# Patient Record
Sex: Female | Born: 1970
Health system: Southern US, Community
[De-identification: ages and names within clinical notes are randomized; demographics above are authoritative.]

## PROBLEM LIST (undated history)

## (undated) DIAGNOSIS — R634 Abnormal weight loss: Secondary | ICD-10-CM

## (undated) DIAGNOSIS — J449 Chronic obstructive pulmonary disease, unspecified: Secondary | ICD-10-CM

## (undated) DIAGNOSIS — R05 Cough: Secondary | ICD-10-CM

## (undated) DIAGNOSIS — K219 Gastro-esophageal reflux disease without esophagitis: Secondary | ICD-10-CM

## (undated) DIAGNOSIS — R531 Weakness: Secondary | ICD-10-CM

## (undated) DIAGNOSIS — D649 Anemia, unspecified: Secondary | ICD-10-CM

## (undated) DIAGNOSIS — R51 Headache: Secondary | ICD-10-CM

## (undated) DIAGNOSIS — J029 Acute pharyngitis, unspecified: Secondary | ICD-10-CM

## (undated) DIAGNOSIS — G43909 Migraine, unspecified, not intractable, without status migrainosus: Secondary | ICD-10-CM

## (undated) DIAGNOSIS — R059 Cough, unspecified: Secondary | ICD-10-CM

## (undated) HISTORY — DX: Headache: R51

## (undated) HISTORY — DX: Acute pharyngitis, unspecified: J02.9

## (undated) HISTORY — DX: Cough: R05

## (undated) HISTORY — DX: Gastro-esophageal reflux disease without esophagitis: K21.9

## (undated) HISTORY — PX: TUBAL LIGATION: SHX77

## (undated) HISTORY — DX: Abnormal weight loss: R63.4

## (undated) HISTORY — DX: Cough, unspecified: R05.9

## (undated) HISTORY — DX: Weakness: R53.1

## (undated) HISTORY — DX: Anemia, unspecified: D64.9

---

## 1997-06-27 ENCOUNTER — Inpatient Hospital Stay (HOSPITAL_COMMUNITY): Admission: AD | Admit: 1997-06-27 | Discharge: 1997-06-27 | Payer: Self-pay | Admitting: Obstetrics & Gynecology

## 1998-07-16 ENCOUNTER — Emergency Department (HOSPITAL_COMMUNITY): Admission: EM | Admit: 1998-07-16 | Discharge: 1998-07-16 | Payer: Self-pay | Admitting: Emergency Medicine

## 1999-06-25 ENCOUNTER — Emergency Department (HOSPITAL_COMMUNITY): Admission: EM | Admit: 1999-06-25 | Discharge: 1999-06-25 | Payer: Self-pay | Admitting: *Deleted

## 2000-04-03 ENCOUNTER — Other Ambulatory Visit: Admission: RE | Admit: 2000-04-03 | Discharge: 2000-04-03 | Payer: Self-pay

## 2000-10-02 ENCOUNTER — Emergency Department (HOSPITAL_COMMUNITY): Admission: EM | Admit: 2000-10-02 | Discharge: 2000-10-02 | Payer: Self-pay | Admitting: Emergency Medicine

## 2001-03-23 ENCOUNTER — Other Ambulatory Visit: Admission: RE | Admit: 2001-03-23 | Discharge: 2001-03-23 | Payer: Self-pay | Admitting: Family Medicine

## 2001-06-18 ENCOUNTER — Encounter: Payer: Self-pay | Admitting: Emergency Medicine

## 2001-06-18 ENCOUNTER — Emergency Department (HOSPITAL_COMMUNITY): Admission: EM | Admit: 2001-06-18 | Discharge: 2001-06-18 | Payer: Self-pay | Admitting: Emergency Medicine

## 2002-09-05 ENCOUNTER — Emergency Department (HOSPITAL_COMMUNITY): Admission: EM | Admit: 2002-09-05 | Discharge: 2002-09-05 | Payer: Self-pay | Admitting: Emergency Medicine

## 2002-09-05 ENCOUNTER — Encounter: Payer: Self-pay | Admitting: Emergency Medicine

## 2003-01-16 ENCOUNTER — Emergency Department (HOSPITAL_COMMUNITY): Admission: EM | Admit: 2003-01-16 | Discharge: 2003-01-16 | Payer: Self-pay | Admitting: Emergency Medicine

## 2003-07-21 ENCOUNTER — Emergency Department (HOSPITAL_COMMUNITY): Admission: EM | Admit: 2003-07-21 | Discharge: 2003-07-21 | Payer: Self-pay | Admitting: Emergency Medicine

## 2005-02-18 ENCOUNTER — Emergency Department (HOSPITAL_COMMUNITY): Admission: EM | Admit: 2005-02-18 | Discharge: 2005-02-18 | Payer: Self-pay | Admitting: Family Medicine

## 2005-09-15 ENCOUNTER — Emergency Department (HOSPITAL_COMMUNITY): Admission: EM | Admit: 2005-09-15 | Discharge: 2005-09-15 | Payer: Self-pay | Admitting: Emergency Medicine

## 2006-05-27 ENCOUNTER — Emergency Department (HOSPITAL_COMMUNITY): Admission: EM | Admit: 2006-05-27 | Discharge: 2006-05-27 | Payer: Self-pay | Admitting: Family Medicine

## 2006-11-27 ENCOUNTER — Encounter: Admission: RE | Admit: 2006-11-27 | Discharge: 2006-11-27 | Payer: Self-pay | Admitting: Internal Medicine

## 2008-03-16 ENCOUNTER — Emergency Department (HOSPITAL_COMMUNITY): Admission: EM | Admit: 2008-03-16 | Discharge: 2008-03-16 | Payer: Self-pay | Admitting: Emergency Medicine

## 2008-12-21 ENCOUNTER — Emergency Department (HOSPITAL_COMMUNITY): Admission: EM | Admit: 2008-12-21 | Discharge: 2008-12-21 | Payer: Self-pay | Admitting: Emergency Medicine

## 2009-09-13 ENCOUNTER — Emergency Department (HOSPITAL_COMMUNITY): Admission: EM | Admit: 2009-09-13 | Discharge: 2009-09-13 | Payer: Self-pay | Admitting: Emergency Medicine

## 2010-05-05 ENCOUNTER — Encounter: Payer: Self-pay | Admitting: Internal Medicine

## 2011-03-13 ENCOUNTER — Encounter: Payer: Self-pay | Admitting: Emergency Medicine

## 2011-03-13 ENCOUNTER — Inpatient Hospital Stay (HOSPITAL_COMMUNITY)
Admission: EM | Admit: 2011-03-13 | Discharge: 2011-03-15 | DRG: 343 | Disposition: A | Discharge status: Home / Self Care | Payer: Self-pay | Source: Ambulatory Visit | Attending: Surgery | Admitting: Surgery

## 2011-03-13 DIAGNOSIS — K37 Unspecified appendicitis: Secondary | ICD-10-CM | POA: Diagnosis present

## 2011-03-13 DIAGNOSIS — Z888 Allergy status to other drugs, medicaments and biological substances status: Secondary | ICD-10-CM

## 2011-03-13 DIAGNOSIS — K3580 Unspecified acute appendicitis: Principal | ICD-10-CM | POA: Diagnosis present

## 2011-03-13 LAB — BASIC METABOLIC PANEL
BUN: 6 mg/dL (ref 6–23)
CO2: 24 mEq/L (ref 19–32)
Calcium: 9.1 mg/dL (ref 8.4–10.5)
Chloride: 102 mEq/L (ref 96–112)
Creatinine, Ser: 0.53 mg/dL (ref 0.50–1.10)
GFR calc Af Amer: >90 mL/min (ref >90)
GFR calc non Af Amer: >90 mL/min (ref >90)
Glucose, Bld: 111 mg/dL — ABNORMAL HIGH (ref 70–99)
Potassium: 3.6 mEq/L (ref 3.5–5.1)
Sodium: 137 mEq/L (ref 135–145)

## 2011-03-13 LAB — CBC
HCT: 34.3 % — ABNORMAL LOW (ref 36.0–46.0)
Hemoglobin: 11.4 g/dL — ABNORMAL LOW (ref 12.0–15.0)
MCH: 28.1 pg (ref 26.0–34.0)
MCHC: 33.2 g/dL (ref 30.0–36.0)
MCV: 84.7 fL (ref 78.0–100.0)
Platelets: 249 10*3/uL (ref 150–400)
RBC: 4.05 MIL/uL (ref 3.87–5.11)
RDW: 15.7 % — ABNORMAL HIGH (ref 11.5–15.5)
WBC: 8.4 10*3/uL (ref 4.0–10.5)

## 2011-03-13 LAB — DIFFERENTIAL
Basophils Absolute: 0 10*3/uL (ref 0.0–0.1)
Basophils Relative: 0 % (ref 0–1)
Eosinophils Absolute: 0 10*3/uL (ref 0.0–0.7)
Eosinophils Relative: 0 % (ref 0–5)
Lymphocytes Relative: 8 % — ABNORMAL LOW (ref 12–46)
Lymphs Abs: 0.7 10*3/uL (ref 0.7–4.0)
Monocytes Absolute: 0.2 10*3/uL (ref 0.1–1.0)
Monocytes Relative: 3 % (ref 3–12)
Neutro Abs: 7.5 10*3/uL (ref 1.7–7.7)
Neutrophils Relative %: 89 % — ABNORMAL HIGH (ref 43–77)

## 2011-03-13 NOTE — ED Notes (Signed)
PT. REPORTS VOMITTING WITH MID ABDOMINAL CRAMPING ONSET THIS AFTERNOON ,  DENIES DIARRHEA , NO FEVER , SLIGHT CHILLS .

## 2011-03-14 ENCOUNTER — Encounter (HOSPITAL_COMMUNITY): Payer: Self-pay | Admitting: Anesthesiology

## 2011-03-14 ENCOUNTER — Encounter (HOSPITAL_COMMUNITY): Payer: Self-pay | Admitting: General Surgery

## 2011-03-14 ENCOUNTER — Inpatient Hospital Stay (HOSPITAL_COMMUNITY): Payer: Self-pay | Admitting: Anesthesiology

## 2011-03-14 ENCOUNTER — Encounter (HOSPITAL_COMMUNITY): Admission: EM | Disposition: A | Discharge status: Home / Self Care | Payer: Self-pay | Source: Ambulatory Visit

## 2011-03-14 ENCOUNTER — Emergency Department (HOSPITAL_COMMUNITY): Payer: Self-pay

## 2011-03-14 ENCOUNTER — Other Ambulatory Visit (INDEPENDENT_AMBULATORY_CARE_PROVIDER_SITE_OTHER): Payer: Self-pay | Admitting: Surgery

## 2011-03-14 DIAGNOSIS — K37 Unspecified appendicitis: Secondary | ICD-10-CM | POA: Diagnosis present

## 2011-03-14 DIAGNOSIS — K3580 Unspecified acute appendicitis: Secondary | ICD-10-CM

## 2011-03-14 HISTORY — PX: APPENDECTOMY: SHX54

## 2011-03-14 HISTORY — PX: LAPAROSCOPIC APPENDECTOMY: SHX408

## 2011-03-14 LAB — URINE MICROSCOPIC-ADD ON

## 2011-03-14 LAB — POCT PREGNANCY, URINE: Preg Test, Ur: NEGATIVE

## 2011-03-14 LAB — HEPATIC FUNCTION PANEL
ALT: 11 U/L (ref 0–35)
AST: 17 U/L (ref 0–37)
Albumin: 3.9 g/dL (ref 3.5–5.2)
Alkaline Phosphatase: 48 U/L (ref 39–117)
Bilirubin, Direct: 0.1 mg/dL (ref 0.0–0.3)
Indirect Bilirubin: 0.3 mg/dL (ref 0.3–0.9)
Total Bilirubin: 0.4 mg/dL (ref 0.3–1.2)
Total Protein: 7.7 g/dL (ref 6.0–8.3)

## 2011-03-14 LAB — URINALYSIS, ROUTINE W REFLEX MICROSCOPIC
Glucose, UA: NEGATIVE mg/dL
Hgb urine dipstick: NEGATIVE
Ketones, ur: 40 mg/dL — AB
Nitrite: NEGATIVE
Protein, ur: NEGATIVE mg/dL
Specific Gravity, Urine: 1.025 (ref 1.005–1.030)
Urobilinogen, UA: 1 mg/dL (ref 0.0–1.0)
pH: 6 (ref 5.0–8.0)

## 2011-03-14 LAB — GLUCOSE, CAPILLARY: Glucose-Capillary: 75 mg/dL (ref 70–99)

## 2011-03-14 LAB — SURGICAL PCR SCREEN
MRSA, PCR: NEGATIVE
Staphylococcus aureus: NEGATIVE

## 2011-03-14 LAB — LIPASE, BLOOD: Lipase: 20 U/L (ref 11–59)

## 2011-03-14 SURGERY — APPENDECTOMY, LAPAROSCOPIC
Anesthesia: General | Site: Abdomen | Wound class: Clean Contaminated

## 2011-03-14 MED ORDER — CIPROFLOXACIN IN D5W 400 MG/200ML IV SOLN
400.0000 mg | Freq: Two times a day (BID) | INTRAVENOUS | Status: DC
  Filled 2011-03-14 (×2): qty 200

## 2011-03-14 MED ORDER — PROPOFOL 10 MG/ML IV EMUL
INTRAVENOUS | Status: DC | PRN
  Administered 2011-03-14: 100 mg via INTRAVENOUS

## 2011-03-14 MED ORDER — ONDANSETRON HCL 4 MG/2ML IJ SOLN: 4.0000 mg | Freq: Four times a day (QID) | INTRAMUSCULAR | Status: DC | PRN

## 2011-03-14 MED ORDER — SODIUM CHLORIDE 0.9 % IV BOLUS (SEPSIS)
1000.0000 mL | Freq: Once | INTRAVENOUS | Status: AC
  Administered 2011-03-14: 1000 mL via INTRAVENOUS

## 2011-03-14 MED ORDER — PANTOPRAZOLE SODIUM 40 MG IV SOLR
40.0000 mg | Freq: Every day | INTRAVENOUS | Status: DC
  Filled 2011-03-14: qty 40

## 2011-03-14 MED ORDER — FENTANYL CITRATE 0.05 MG/ML IJ SOLN
25.0000 ug | Freq: Once | INTRAMUSCULAR | Status: AC
  Administered 2011-03-14: 25 ug via INTRAVENOUS
  Filled 2011-03-14: qty 2

## 2011-03-14 MED ORDER — LACTATED RINGERS IV SOLN
INTRAVENOUS | Status: DC | PRN
  Administered 2011-03-14 (×2): via INTRAVENOUS

## 2011-03-14 MED ORDER — ONDANSETRON HCL 4 MG/2ML IJ SOLN
INTRAMUSCULAR | Status: DC | PRN
  Administered 2011-03-14: 4 mg via INTRAVENOUS

## 2011-03-14 MED ORDER — METRONIDAZOLE IN NACL 5-0.79 MG/ML-% IV SOLN
500.0000 mg | Freq: Three times a day (TID) | INTRAVENOUS | Status: DC
  Filled 2011-03-14 (×3): qty 100

## 2011-03-14 MED ORDER — KCL IN DEXTROSE-NACL 20-5-0.45 MEQ/L-%-% IV SOLN
INTRAVENOUS | Status: DC
  Administered 2011-03-14: 06:00:00 via INTRAVENOUS
  Filled 2011-03-14 (×3): qty 1000

## 2011-03-14 MED ORDER — NICOTINE 14 MG/24HR TD PT24
14.0000 mg | MEDICATED_PATCH | Freq: Every day | TRANSDERMAL | Status: DC
  Filled 2011-03-14: qty 1

## 2011-03-14 MED ORDER — KCL IN DEXTROSE-NACL 20-5-0.45 MEQ/L-%-% IV SOLN
INTRAVENOUS | Status: DC
  Administered 2011-03-14 (×2): via INTRAVENOUS
  Filled 2011-03-14 (×4): qty 1000

## 2011-03-14 MED ORDER — ACETAMINOPHEN 325 MG PO TABS: 650.0000 mg | ORAL_TABLET | ORAL | Status: AC | PRN

## 2011-03-14 MED ORDER — ACETAMINOPHEN 325 MG PO TABS: 650.0000 mg | ORAL_TABLET | ORAL | Status: DC | PRN

## 2011-03-14 MED ORDER — DIPHENHYDRAMINE HCL 50 MG/ML IJ SOLN: 12.5000 mg | Freq: Four times a day (QID) | INTRAMUSCULAR | Status: DC | PRN

## 2011-03-14 MED ORDER — ACETAMINOPHEN-CODEINE #3 300-30 MG PO TABS: 1.0000 | ORAL_TABLET | ORAL | Status: DC | PRN

## 2011-03-14 MED ORDER — SODIUM CHLORIDE 0.9 % IR SOLN
Status: DC | PRN
  Administered 2011-03-14 (×2): 1

## 2011-03-14 MED ORDER — ONDANSETRON HCL 4 MG/2ML IJ SOLN: 4.0000 mg | Freq: Once | INTRAMUSCULAR | Status: DC | PRN

## 2011-03-14 MED ORDER — DEXTROSE 5 % IV SOLN
1.0000 g | INTRAVENOUS | Status: DC | PRN
  Administered 2011-03-14: 1 g via INTRAVENOUS

## 2011-03-14 MED ORDER — MIDAZOLAM HCL 5 MG/5ML IJ SOLN
INTRAMUSCULAR | Status: DC | PRN
  Administered 2011-03-14: 2 mg via INTRAVENOUS

## 2011-03-14 MED ORDER — ROCURONIUM BROMIDE 100 MG/10ML IV SOLN
INTRAVENOUS | Status: DC | PRN
  Administered 2011-03-14: 20 mg via INTRAVENOUS

## 2011-03-14 MED ORDER — HYDROMORPHONE HCL PF 1 MG/ML IJ SOLN: 1.0000 mg | INTRAMUSCULAR | Status: DC | PRN

## 2011-03-14 MED ORDER — GLYCOPYRROLATE 0.2 MG/ML IJ SOLN
INTRAMUSCULAR | Status: DC | PRN
  Administered 2011-03-14: .5 mg via INTRAVENOUS

## 2011-03-14 MED ORDER — DIPHENHYDRAMINE HCL 12.5 MG/5ML PO ELIX
12.5000 mg | ORAL_SOLUTION | Freq: Four times a day (QID) | ORAL | Status: DC | PRN
  Filled 2011-03-14: qty 10

## 2011-03-14 MED ORDER — TRAMADOL HCL 50 MG PO TABS
50.0000 mg | ORAL_TABLET | Freq: Four times a day (QID) | ORAL | Status: DC
  Administered 2011-03-15 (×2): 50 mg via ORAL
  Filled 2011-03-14 (×6): qty 1

## 2011-03-14 MED ORDER — ONDANSETRON HCL 4 MG PO TABS: 4.0000 mg | ORAL_TABLET | Freq: Four times a day (QID) | ORAL | Status: DC | PRN

## 2011-03-14 MED ORDER — MEPERIDINE HCL 25 MG/ML IJ SOLN: 6.2500 mg | INTRAMUSCULAR | Status: DC | PRN

## 2011-03-14 MED ORDER — LIDOCAINE HCL (CARDIAC) 20 MG/ML IV SOLN
INTRAVENOUS | Status: DC | PRN
  Administered 2011-03-14: 50 mg via INTRAVENOUS

## 2011-03-14 MED ORDER — BUPIVACAINE HCL (PF) 0.25 % IJ SOLN
INTRAMUSCULAR | Status: DC | PRN
  Administered 2011-03-14: 12 mL

## 2011-03-14 MED ORDER — IOHEXOL 300 MG/ML  SOLN
100.0000 mL | Freq: Once | INTRAMUSCULAR | Status: AC | PRN
  Administered 2011-03-14: 100 mL via INTRAVENOUS

## 2011-03-14 MED ORDER — NEOSTIGMINE METHYLSULFATE 1 MG/ML IJ SOLN
INTRAMUSCULAR | Status: DC | PRN
  Administered 2011-03-14: 2.5 mg via INTRAVENOUS

## 2011-03-14 MED ORDER — HYDROMORPHONE HCL PF 1 MG/ML IJ SOLN
0.2500 mg | INTRAMUSCULAR | Status: DC | PRN
  Administered 2011-03-14: 0.5 mg via INTRAVENOUS

## 2011-03-14 MED ORDER — MORPHINE SULFATE 2 MG/ML IJ SOLN
1.0000 mg | INTRAMUSCULAR | Status: DC | PRN
  Administered 2011-03-14: 18:00:00 2 mg via INTRAVENOUS
  Filled 2011-03-14: qty 1

## 2011-03-14 MED ORDER — SUCCINYLCHOLINE CHLORIDE 20 MG/ML IJ SOLN
INTRAMUSCULAR | Status: DC | PRN
  Administered 2011-03-14: 100 mg via INTRAVENOUS

## 2011-03-14 MED ORDER — FENTANYL CITRATE 0.05 MG/ML IJ SOLN
INTRAMUSCULAR | Status: DC | PRN
  Administered 2011-03-14: 50 ug via INTRAVENOUS
  Administered 2011-03-14: 150 ug via INTRAVENOUS
  Administered 2011-03-14: 50 ug via INTRAVENOUS

## 2011-03-14 MED ORDER — LACTATED RINGERS IV SOLN
INTRAVENOUS | Status: DC
  Administered 2011-03-14: 09:00:00 via INTRAVENOUS

## 2011-03-14 MED ORDER — ONDANSETRON HCL 4 MG/2ML IJ SOLN
4.0000 mg | Freq: Once | INTRAMUSCULAR | Status: AC
  Administered 2011-03-14: 4 mg via INTRAVENOUS
  Filled 2011-03-14: qty 2

## 2011-03-14 SURGICAL SUPPLY — 52 items
ADH SKN CLS APL DERMABOND .7 (GAUZE/BANDAGES/DRESSINGS) ×1
APL SKNCLS STERI-STRIP NONHPOA (GAUZE/BANDAGES/DRESSINGS) ×1
APPLIER CLIP ROT 10 11.4 M/L (STAPLE)
APR CLP MED LRG 11.4X10 (STAPLE)
BAG SPEC RTRVL LRG 6X4 10 (ENDOMECHANICALS) ×1
BENZOIN TINCTURE PRP APPL 2/3 (GAUZE/BANDAGES/DRESSINGS) ×2 IMPLANT
BLADE SURG ROTATE 9660 (MISCELLANEOUS) ×2 IMPLANT
CANISTER SUCTION 2500CC (MISCELLANEOUS) ×2 IMPLANT
CHLORAPREP W/TINT 26ML (MISCELLANEOUS) ×2 IMPLANT
CLIP APPLIE ROT 10 11.4 M/L (STAPLE) IMPLANT
CLOTH BEACON ORANGE TIMEOUT ST (SAFETY) ×2 IMPLANT
COVER SURGICAL LIGHT HANDLE (MISCELLANEOUS) ×2 IMPLANT
CUTTER FLEX LINEAR 45M (STAPLE) ×2 IMPLANT
DERMABOND ADVANCED (GAUZE/BANDAGES/DRESSINGS) ×1
DERMABOND ADVANCED .7 DNX12 (GAUZE/BANDAGES/DRESSINGS) ×1 IMPLANT
ELECT REM PT RETURN 9FT ADLT (ELECTROSURGICAL) ×2
ELECTRODE REM PT RTRN 9FT ADLT (ELECTROSURGICAL) ×1 IMPLANT
ENDOLOOP SUT PDS II  0 18 (SUTURE)
ENDOLOOP SUT PDS II 0 18 (SUTURE) IMPLANT
GLOVE BIO SURGEON STRL SZ7.5 (GLOVE) ×2 IMPLANT
GLOVE BIOGEL PI IND STRL 7.0 (GLOVE) ×1 IMPLANT
GLOVE BIOGEL PI IND STRL 7.5 (GLOVE) ×2 IMPLANT
GLOVE BIOGEL PI INDICATOR 7.0 (GLOVE) ×1
GLOVE BIOGEL PI INDICATOR 7.5 (GLOVE) ×2
GLOVE ECLIPSE 6.5 STRL STRAW (GLOVE) ×2 IMPLANT
GLOVE SS BIOGEL STRL SZ 7 (GLOVE) ×1 IMPLANT
GLOVE SUPERSENSE BIOGEL SZ 7 (GLOVE) ×1
GLOVE SURG SIGNA 7.5 PF LTX (GLOVE) ×2 IMPLANT
GOWN STRL NON-REIN LRG LVL3 (GOWN DISPOSABLE) ×4 IMPLANT
GOWN STRL REIN XL XLG (GOWN DISPOSABLE) ×2 IMPLANT
KIT BASIN OR (CUSTOM PROCEDURE TRAY) ×2 IMPLANT
KIT ROOM TURNOVER OR (KITS) ×2 IMPLANT
NS IRRIG 1000ML POUR BTL (IV SOLUTION) ×2 IMPLANT
PAD ARMBOARD 7.5X6 YLW CONV (MISCELLANEOUS) ×4 IMPLANT
POUCH SPECIMEN RETRIEVAL 10MM (ENDOMECHANICALS) ×2 IMPLANT
RELOAD 45 VASCULAR/THIN (ENDOMECHANICALS) ×2 IMPLANT
RELOAD STAPLE TA45 3.5 REG BLU (ENDOMECHANICALS) IMPLANT
SCALPEL HARMONIC ACE (MISCELLANEOUS) ×2 IMPLANT
SET IRRIG TUBING LAPAROSCOPIC (IRRIGATION / IRRIGATOR) ×2 IMPLANT
SLEEVE Z-THREAD 5X100MM (TROCAR) ×2 IMPLANT
SPECIMEN JAR SMALL (MISCELLANEOUS) ×2 IMPLANT
SUT VIC AB 5-0 PS2 18 (SUTURE) ×4 IMPLANT
SUT VICRYL 0 UR6 27IN ABS (SUTURE) IMPLANT
TOWEL OR 17X24 6PK STRL BLUE (TOWEL DISPOSABLE) ×2 IMPLANT
TOWEL OR 17X26 10 PK STRL BLUE (TOWEL DISPOSABLE) ×2 IMPLANT
TOWEL OR NON WOVEN STRL DISP B (DISPOSABLE) ×2 IMPLANT
TRAY FOLEY CATH 14FR (SET/KITS/TRAYS/PACK) IMPLANT
TRAY LAPAROSCOPIC (CUSTOM PROCEDURE TRAY) ×2 IMPLANT
TROCAR XCEL BLUNT TIP 100MML (ENDOMECHANICALS) ×2 IMPLANT
TROCAR Z-THREAD FIOS 11X100 BL (TROCAR) ×2 IMPLANT
TROCAR Z-THREAD FIOS 5X100MM (TROCAR) ×2 IMPLANT
WATER STERILE IRR 1000ML POUR (IV SOLUTION) IMPLANT

## 2011-03-14 NOTE — Anesthesia Postprocedure Evaluation (Signed)
  Anesthesia Post-op Note  Patient: Nicole Cisneros  Procedure(s) Performed:  APPENDECTOMY LAPAROSCOPIC  Patient Location: PACU  Anesthesia Type: General  Level of Consciousness: awake  Airway and Oxygen Therapy: Patient connected to T-piece oxygen  Post-op Pain: none  Post-op Assessment: Post-op Vital signs reviewed  Post-op Vital Signs: stable  Complications: No apparent anesthesia complications

## 2011-03-14 NOTE — Preoperative (Signed)
Beta Blockers   Reason not to administer Beta Blockers:Not Applicable. No home beta blockers 

## 2011-03-14 NOTE — ED Provider Notes (Signed)
History     CSN: [id number] Arrival date & time: 03/13/2011 10:08 PM   First MD Initiated Contact with Patient 03/14/11 0019      Chief Complaint  Patient presents with  . Emesis    (Consider location/radiation/quality/duration/timing/severity/associated sxs/prior treatment) Patient is a 40 y.o. female presenting with abdominal pain. The history is provided by the patient.  Abdominal Pain The primary symptoms of the illness include abdominal pain and vomiting. The primary symptoms of the illness do not include fever, shortness of breath or dysuria. The current episode started 13 to 24 hours ago. The onset of the illness was gradual. The problem has not changed since onset. Associated with: Nothing. The patient states that she believes she is currently not pregnant. The patient has not had a change in bowel habit. Risk factors: No diabetes. Symptoms associated with the illness do not include chills, anorexia, diaphoresis, heartburn, constipation, urgency, hematuria, frequency or back pain.   pain is sharp in nature located epigastric. Constant since onset with no alleviating factors. No radiation of pain. After onset of pain started having some nausea and vomiting. No diarrhea. No fevers. No trauma. No history of similar symptoms.  History reviewed. No pertinent past medical history.  Past Surgical History  Procedure Date  . Tubal ligation     No family history on file.  History  Substance Use Topics  . Smoking status: Current Everyday Smoker  . Smokeless tobacco: Not on file  . Alcohol Use: No    OB History    Grav Para Term Preterm Abortions TAB SAB Ect Mult Living                  Review of Systems  Constitutional: Negative for fever, chills and diaphoresis.  HENT: Negative for neck pain and neck stiffness.   Eyes: Negative for pain.  Respiratory: Negative for shortness of breath.   Cardiovascular: Negative for chest pain.  Gastrointestinal: Positive for vomiting  and abdominal pain. Negative for heartburn, constipation and anorexia.  Genitourinary: Negative for dysuria, urgency, frequency and hematuria.  Musculoskeletal: Negative for back pain.  Skin: Negative for rash.  Neurological: Negative for headaches.  All other systems reviewed and are negative.    Allergies  Amoxicillin; Erythromycin; Percocet; and Vicodin  Home Medications  No current outpatient prescriptions on file.  BP 112/73  Pulse 57  Temp(Src) 98 F (36.7 C) (Oral)  Resp 17  SpO2 100%  LMP 02/27/2011  Physical Exam  Constitutional: She is oriented to person, place, and time. She appears well-developed and well-nourished.  HENT:  Head: Normocephalic and atraumatic.  Eyes: Conjunctivae and EOM are normal. Pupils are equal, round, and reactive to light.  Neck: Trachea normal. Neck supple. No thyromegaly present.  Cardiovascular: Normal rate, regular rhythm, S1 normal, S2 normal and normal pulses.     No systolic murmur is present   No diastolic murmur is present  Pulses:      Radial pulses are 2+ on the right side, and 2+ on the left side.  Pulmonary/Chest: Effort normal and breath sounds normal. She has no wheezes. She has no rhonchi. She has no rales. She exhibits no tenderness.  Abdominal: Soft. Normal appearance and bowel sounds are normal. There is no CVA tenderness and negative Murphy's sign.       Tender palpation periumbilical as well as right lower quadrant somewhat over left lower quadrant. Mild voluntary guarding no rebound. No discoloration. bowel sounds present  Musculoskeletal:  BLE:s Calves nontender, no cords or erythema, negative Homans sign  Neurological: She is alert and oriented to person, place, and time. She has normal strength. No cranial nerve deficit or sensory deficit. GCS eye subscore is 4. GCS verbal subscore is 5. GCS motor subscore is 6.  Skin: Skin is warm and dry. No rash noted. She is not diaphoretic.  Psychiatric: Her speech is  normal.       Cooperative and appropriate    ED Course  Procedures (including critical care time)  Labs Reviewed  BASIC METABOLIC PANEL - Abnormal; Notable for the following:    Glucose, Bld 111 (*)    All other components within normal limits  URINALYSIS, ROUTINE W REFLEX MICROSCOPIC - Abnormal; Notable for the following:    Color, Urine AMBER (*) BIOCHEMICALS MAY BE AFFECTED BY COLOR   APPearance CLOUDY (*)    Bilirubin Urine SMALL (*)    Ketones, ur 40 (*)    Leukocytes, UA SMALL (*)    All other components within normal limits  CBC - Abnormal; Notable for the following:    Hemoglobin 11.4 (*)    HCT 34.3 (*)    RDW 15.7 (*)    All other components within normal limits  DIFFERENTIAL - Abnormal; Notable for the following:    Neutrophils Relative 89 (*)    Lymphocytes Relative 8 (*)    All other components within normal limits  URINE MICROSCOPIC-ADD ON - Abnormal; Notable for the following:    Squamous Epithelial / LPF FEW (*)    Bacteria, UA MANY (*)    Casts GRANULAR CAST (*) HYALINE CASTS   All other components within normal limits  HEPATIC FUNCTION PANEL  LIPASE, BLOOD  POCT PREGNANCY, URINE  POCT PREGNANCY, URINE   Ct Abdomen Pelvis W Contrast  03/14/2011  *RADIOLOGY REPORT*  Clinical Data: Right lower quadrant abdominal pain; nausea and vomiting.  CT ABDOMEN AND PELVIS WITH CONTRAST  Technique:  Multidetector CT imaging of the abdomen and pelvis was performed following the standard protocol during bolus administration of intravenous contrast.  Contrast: OMNIPAQUE IOHEXOL 300 MG/ML IV SOLN  Comparison: None.  Findings: The visualized lung bases are clear.  Three focal lesions are identified within the liver, measuring 5.3 cm, 4.6 cm and 2.2 cm in size.  These are hypodense, with peripheral nodular enhancement and gradual centripetal enhancement on delayed imaging, compatible with cavernous hemangiomas.  The liver is otherwise unremarkable in appearance.  The spleen  is within normal limits.  The pancreas and adrenal glands are unremarkable.  The kidneys are grossly unremarkable in appearance.  There is no evidence of hydronephrosis.  No renal or ureteral stones are seen, though evaluation for stones is suboptimal due to the phase of contrast enhancement.  No perinephric stranding is appreciated.  No free fluid is identified.  The small bowel is unremarkable in appearance.  The stomach is within normal limits.  No acute vascular abnormalities are seen.  The appendix is distended up to 1.0 cm in maximal diameter, tracking deep into the right hemipelvis adjacent to the fundus of the uterus.  Associated wall thickening is noted.  A trace amount of contrast is seen tracking into the appendix, but the abnormal caliber and apparent edema involving the appendix raises suspicion for appendicitis.  Trace free fluid is noted within the pelvis, slightly more prominent on the right.  No pericecal lymphadenopathy is seen.  The colon is filled with contrast to the level of the splenic flexure.  The sigmoid colon is decompressed and difficult to fully assess.  No definite colonic abnormalities are seen.  The bladder is largely decompressed and markedly thick-walled, raising suspicion for chronic cystitis.  Within the uterus, there appears to be a small 1.0 cm soft tissue density, which most likely reflects an endometrial polyp.  Endometrial malignancy cannot be entirely excluded.  The ovaries are grossly unremarkable in appearance; no suspicious adnexal masses are seen.  No inguinal lymphadenopathy is seen.  No acute osseous abnormalities are identified.  IMPRESSION:  1.  Suspect acute appendicitis, with distension of the appendix to 1.0 cm in maximal diameter, tracking deep into the right hemipelvis adjacent to the fundus of the uterus.  Associated appendiceal wall thickening noted.  Trace amount of contrast seen tracking into the appendix.  Trace free fluid noted within the pelvis, slightly  more prominent on the right.  No evidence for perforation or abscess formation. 2.  Mildly thick-walled bladder raises suspicion for chronic cystitis. 3.  Small 1.0 cm soft tissue density noted within the endometrial canal, most likely reflecting an endometrial polyp.  Endometrial malignancy cannot be entirely excluded.  Further evaluation is recommended, as deemed clinically appropriate. 4.  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  Findings were discussed with Dr. Sunnie Nielsen at 04:52 a.m. on 03/14/2011.  Original Report Authenticated By: Tonia Ghent, M.D.     Case discussed as above with Dr. Janee Morn at 5 AM, will evaluate in the emergency department.   MDM  Abdominal pain with workup as above including CAT scan that shows suspected acute appendicitis. Case discussed with surgery. Pain control and nausea control. IV fluids. Labs reviewed as above. Urinalysis noted has no UTI symptoms.   General surgery admit for appendicitis and plan OR this am       Sunnie Nielsen, MD 03/14/11 [phone]

## 2011-03-14 NOTE — Anesthesia Procedure Notes (Signed)
Procedure Name: Intubation Date/Time: 03/14/2011 9:39 AM Performed by: Margaree Mackintosh Pre-anesthesia Checklist: Patient identified, Emergency Drugs available, Suction available, Patient being monitored and Timeout performed Patient Re-evaluated:Patient Re-evaluated prior to inductionOxygen Delivery Method: Circle System Utilized Preoxygenation: Pre-oxygenation with 100% oxygen Intubation Type: IV induction, Rapid sequence and Circoid Pressure applied Laryngoscope Size: Mac and 3 Grade View: Grade I Tube type: Oral Tube size: 7.5 mm Number of attempts: 1 Airway Equipment and Method: stylet Placement Confirmation: ETT inserted through vocal cords under direct vision,  positive ETCO2 and breath sounds checked- equal and bilateral Secured at: 20 cm Tube secured with: Tape Dental Injury: Teeth and Oropharynx as per pre-operative assessment

## 2011-03-14 NOTE — H&P (Signed)
Nicole Cisneros is an 40 y.o. female.   Chief Complaint: Abdominal pain nausea vomiting HPI: Patient developed acute onset right lower quadrant and periumbilical abdominal pain around 2 PM yesterday. She soon developed associated nausea. She had several episodes of emesis. She had no appetite throughout the day. She continued to have the pain. She came to [facility] emergency department for evaluation. Lab work did not demonstrate leukocytosis however CT scan of the abdomen and pelvis was done showing early acute appendicitis. There is no evidence of perforation.  History reviewed. No pertinent past medical history.  Past Surgical History  Procedure Date  . Tubal ligation     No family history on file. Social History:  reports that she has been smoking.  She does not have any smokeless tobacco history on file. She reports that she does not drink alcohol. Her drug history not on file. smokes marijuana  Allergies:  Allergies  Allergen Reactions  . Amoxicillin Nausea And Vomiting  . Erythromycin Nausea And Vomiting  . Percocet (Oxycodone-Acetaminophen) Nausea And Vomiting  . Vicodin (Hydrocodone-Acetaminophen) Nausea And Vomiting    Medications Prior to Admission  Medication Dose Route Frequency Provider Last Rate Last Dose  . fentaNYL (SUBLIMAZE) injection 25 mcg  25 mcg Intravenous Once Sunnie Nielsen, MD   25 mcg at 03/14/11 0214  . iohexol (OMNIPAQUE) 300 MG/ML injection 100 mL  100 mL Intravenous Once PRN Medication Radiologist   100 mL at 03/14/11 0340  . ondansetron (ZOFRAN) injection 4 mg  4 mg Intravenous Once Sunnie Nielsen, MD   4 mg at 03/14/11 0052  . sodium chloride 0.9 % bolus 1,000 mL  1,000 mL Intravenous Once Sunnie Nielsen, MD   1,000 mL at 03/14/11 0052  . sodium chloride 0.9 % bolus 1,000 mL  1,000 mL Intravenous Once Sunnie Nielsen, MD   1,000 mL at 03/14/11 0340   No current outpatient prescriptions on file as of 03/13/2011.    Results for orders placed during the hospital  encounter of 03/13/11 (from the past 48 hour(s))  BASIC METABOLIC PANEL     Status: Abnormal   Collection Time   03/13/11 10:26 PM      Component Value Range Comment   Sodium 137  135 - 145 (mEq/L)    Potassium 3.6  3.5 - 5.1 (mEq/L)    Chloride 102  96 - 112 (mEq/L)    CO2 24  19 - 32 (mEq/L)    Glucose, Bld 111 (*) 70 - 99 (mg/dL)    BUN 6  6 - 23 (mg/dL)    Creatinine, Ser 4.69  0.50 - 1.10 (mg/dL)    Calcium 9.1  8.4 - 10.5 (mg/dL)    GFR calc non Af Amer >90  >90 (mL/min)    GFR calc Af Amer >90  >90 (mL/min)   CBC     Status: Abnormal   Collection Time   03/13/11 10:26 PM      Component Value Range Comment   WBC 8.4  4.0 - 10.5 (K/uL)    RBC 4.05  3.87 - 5.11 (MIL/uL)    Hemoglobin 11.4 (*) 12.0 - 15.0 (g/dL)    HCT 62.9 (*) 52.8 - 46.0 (%)    MCV 84.7  78.0 - 100.0 (fL)    MCH 28.1  26.0 - 34.0 (pg)    MCHC 33.2  30.0 - 36.0 (g/dL)    RDW 41.3 (*) 24.4 - 15.5 (%)    Platelets 249  150 - 400 (K/uL)  DIFFERENTIAL     Status: Abnormal   Collection Time   03/13/11 10:26 PM      Component Value Range Comment   Neutrophils Relative 89 (*) 43 - 77 (%)    Neutro Abs 7.5  1.7 - 7.7 (K/uL)    Lymphocytes Relative 8 (*) 12 - 46 (%)    Lymphs Abs 0.7  0.7 - 4.0 (K/uL)    Monocytes Relative 3  3 - 12 (%)    Monocytes Absolute 0.2  0.1 - 1.0 (K/uL)    Eosinophils Relative 0  0 - 5 (%)    Eosinophils Absolute 0.0  0.0 - 0.7 (K/uL)    Basophils Relative 0  0 - 1 (%)    Basophils Absolute 0.0  0.0 - 0.1 (K/uL)   HEPATIC FUNCTION PANEL     Status: Normal   Collection Time   03/14/11 12:21 AM      Component Value Range Comment   Total Protein 7.7  6.0 - 8.3 (g/dL)    Albumin 3.9  3.5 - 5.2 (g/dL)    AST 17  0 - 37 (U/L)    ALT 11  0 - 35 (U/L)    Alkaline Phosphatase 48  39 - 117 (U/L)    Total Bilirubin 0.4  0.3 - 1.2 (mg/dL)    Bilirubin, Direct 0.1  0.0 - 0.3 (mg/dL)    Indirect Bilirubin 0.3  0.3 - 0.9 (mg/dL)   LIPASE, BLOOD     Status: Normal   Collection Time    03/14/11 12:21 AM      Component Value Range Comment   Lipase 20  11 - 59 (U/L)   URINALYSIS, ROUTINE W REFLEX MICROSCOPIC     Status: Abnormal   Collection Time   03/14/11 12:32 AM      Component Value Range Comment   Color, Urine AMBER (*) YELLOW  BIOCHEMICALS MAY BE AFFECTED BY COLOR   APPearance CLOUDY (*) CLEAR     Specific Gravity, Urine 1.025  1.005 - 1.030     pH 6.0  5.0 - 8.0     Glucose, UA NEGATIVE  NEGATIVE (mg/dL)    Hgb urine dipstick NEGATIVE  NEGATIVE     Bilirubin Urine SMALL (*) NEGATIVE     Ketones, ur 40 (*) NEGATIVE (mg/dL)    Protein, ur NEGATIVE  NEGATIVE (mg/dL)    Urobilinogen, UA 1.0  0.0 - 1.0 (mg/dL)    Nitrite NEGATIVE  NEGATIVE     Leukocytes, UA SMALL (*) NEGATIVE    URINE MICROSCOPIC-ADD ON     Status: Abnormal   Collection Time   03/14/11 12:32 AM      Component Value Range Comment   Squamous Epithelial / LPF FEW (*) RARE     WBC, UA 3-6  <3 (WBC/hpf)    RBC / HPF 7-10  <3 (RBC/hpf)    Bacteria, UA MANY (*) RARE     Casts GRANULAR CAST (*) NEGATIVE  HYALINE CASTS  POCT PREGNANCY, URINE     Status: Normal   Collection Time   03/14/11 12:39 AM      Component Value Range Comment   Preg Test, Ur NEGATIVE      Ct Abdomen Pelvis W Contrast  03/14/2011  *RADIOLOGY REPORT*  Clinical Data: Right lower quadrant abdominal pain; nausea and vomiting.  CT ABDOMEN AND PELVIS WITH CONTRAST  Technique:  Multidetector CT imaging of the abdomen and pelvis was performed following the standard protocol during bolus administration of intravenous  contrast.  Contrast: OMNIPAQUE IOHEXOL 300 MG/ML IV SOLN  Comparison: None.  Findings: The visualized lung bases are clear.  Three focal lesions are identified within the liver, measuring 5.3 cm, 4.6 cm and 2.2 cm in size.  These are hypodense, with peripheral nodular enhancement and gradual centripetal enhancement on delayed imaging, compatible with cavernous hemangiomas.  The liver is otherwise unremarkable in  appearance.  The spleen is within normal limits.  The pancreas and adrenal glands are unremarkable.  The kidneys are grossly unremarkable in appearance.  There is no evidence of hydronephrosis.  No renal or ureteral stones are seen, though evaluation for stones is suboptimal due to the phase of contrast enhancement.  No perinephric stranding is appreciated.  No free fluid is identified.  The small bowel is unremarkable in appearance.  The stomach is within normal limits.  No acute vascular abnormalities are seen.  The appendix is distended up to 1.0 cm in maximal diameter, tracking deep into the right hemipelvis adjacent to the fundus of the uterus.  Associated wall thickening is noted.  A trace amount of contrast is seen tracking into the appendix, but the abnormal caliber and apparent edema involving the appendix raises suspicion for appendicitis.  Trace free fluid is noted within the pelvis, slightly more prominent on the right.  No pericecal lymphadenopathy is seen.  The colon is filled with contrast to the level of the splenic flexure.  The sigmoid colon is decompressed and difficult to fully assess.  No definite colonic abnormalities are seen.  The bladder is largely decompressed and markedly thick-walled, raising suspicion for chronic cystitis.  Within the uterus, there appears to be a small 1.0 cm soft tissue density, which most likely reflects an endometrial polyp.  Endometrial malignancy cannot be entirely excluded.  The ovaries are grossly unremarkable in appearance; no suspicious adnexal masses are seen.  No inguinal lymphadenopathy is seen.  No acute osseous abnormalities are identified.  IMPRESSION:  1.  Suspect acute appendicitis, with distension of the appendix to 1.0 cm in maximal diameter, tracking deep into the right hemipelvis adjacent to the fundus of the uterus.  Associated appendiceal wall thickening noted.  Trace amount of contrast seen tracking into the appendix.  Trace free fluid noted  within the pelvis, slightly more prominent on the right.  No evidence for perforation or abscess formation. 2.  Mildly thick-walled bladder raises suspicion for chronic cystitis. 3.  Small 1.0 cm soft tissue density noted within the endometrial canal, most likely reflecting an endometrial polyp.  Endometrial malignancy cannot be entirely excluded.  Further evaluation is recommended, as deemed clinically appropriate. 4.  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  Findings were discussed with Dr. Sunnie Nielsen at 04:52 a.m. on 03/14/2011.  Original Report Authenticated By: Tonia Ghent, M.D.    Review of Systems  Constitutional: Negative.   HENT: Negative.   Eyes: Negative.   Respiratory: Negative.   Cardiovascular: Negative.   Gastrointestinal: Positive for nausea, vomiting and abdominal pain. Negative for heartburn, diarrhea and blood in stool.       Occasional epigastric pain but not currently  Genitourinary: Negative.   Musculoskeletal: Negative.   Skin: Negative.   Neurological: Negative.     Blood pressure 112/73, pulse 57, temperature 98 F (36.7 C), temperature source Oral, resp. rate 17, last menstrual period 02/27/2011, SpO2 100.00%. Physical Exam  Constitutional: She appears well-developed. No distress.  HENT:  Head: Normocephalic and  atraumatic.  Eyes: Conjunctivae and EOM are normal. Pupils are equal, round, and reactive to light. No scleral icterus.  Neck: Normal range of motion. Neck supple. No tracheal deviation present.  Cardiovascular: Normal rate, regular rhythm and normal heart sounds.   No murmur heard. Respiratory: Effort normal. No respiratory distress. She has wheezes. She has no rales. She exhibits no tenderness.       Very mild wheeze  GI: Soft. She exhibits no mass. There is tenderness. There is no rebound and no guarding.       Most tender RLQ extending lower from there towards pubis      Assessment/Plan Acute appendicitis. Plan will be to give IV antibiotics, keep her n.p.o., and proceed with laparoscopic appendectomy this morning. Procedure, risks, and benefits were discussed in detail with the patient. Questions were answered. She is agreeable. I will discuss this case with Dr. Ezzard Standing, my partner who is working today.  Roneisha Stern E 03/14/2011, 5:40 AM

## 2011-03-14 NOTE — Transfer of Care (Signed)
Immediate Anesthesia Transfer of Care Note  Patient: Nicole Cisneros  Procedure(s) Performed:  APPENDECTOMY LAPAROSCOPIC  Patient Location: PACU  Anesthesia Type: General  Level of Consciousness: sedated  Airway & Oxygen Therapy: Patient Spontanous Breathing and Patient connected to nasal cannula oxygen  Post-op Assessment: Report given to PACU RN and Post -op Vital signs reviewed and stable  Post vital signs: stable  Complications: No apparent anesthesia complications

## 2011-03-14 NOTE — Plan of Care (Signed)
Problem: Consults Goal: Diabetes Guidelines if Diabetic/Glucose > 140 If diabetic or lab glucose is > 140 mg/dl - Initiate Diabetes/Hyperglycemia Guidelines & Document Interventions  NA

## 2011-03-14 NOTE — ED Notes (Signed)
Pt returned from CT °

## 2011-03-14 NOTE — ED Notes (Signed)
PT will be ready in 0300.

## 2011-03-14 NOTE — Plan of Care (Signed)
Problem: Consults Goal: Skin Care Protocol Initiated - if indicated If consults are not indicated, leave blank or document N/A  NA

## 2011-03-14 NOTE — ED Notes (Signed)
Patient transported to CT 

## 2011-03-14 NOTE — ED Notes (Signed)
Attempted to call report x 1.  Given name and number and nurse to call back.  

## 2011-03-14 NOTE — Op Note (Signed)
NAMECHRISTIAN, TREADWAY               ACCOUNT NO.:  [id number]  MEDICAL RECORD NO.:  [id number]  LOCATION:  [facility]                         FACILITY:  [facility]  PHYSICIAN:  Sandria Bales. Ezzard Standing, M.D.  DATE OF BIRTH:  Jun 19, 1970  DATE OF PROCEDURE:  03/14/2011                               OPERATIVE REPORT  PREOPERATIVE DIAGNOSIS:  Acute appendicitis.  POSTOPERATIVE DIAGNOSIS:  Acute appendicitis.  PROCEDURE:  Laparoscopic appendectomy.  SURGEON:  Sandria Bales. Ezzard Standing, M.D.  ASSISTANT:  No first assistant.  ANESTHESIA:  General endotracheal, supervised by Dr. Arta Bruce.  ESTIMATED BLOOD LOSS:  Minimal.  INDICATION FOR PROCEDURE:  Ms. Karagan Lehr is a 40 year old African American female, who presents with acute appendicitis.  She was originally seen by Dr. Violeta Gelinas, who was on call last night, and I am taking care of her this morning.    I discussed with her the indications and potential complications of laparoscopic appendectomy.  I discussed the indications and potential complications, which include, but are not limited to, bleeding, open surgery, resection of bowel or another diagnosis.  OPERATIVE NOTE:  The patient placed in a supine position, given a general endotracheal anesthesia, supervised by Dr. Arta Bruce.  Her abdomen was prepped with ChloraPrep and sterilely draped.  She was given 1 g of cefoxitin at initiation of procedure.  A time-out was held in the surgical checklist run.  An infraumbilical incision was made with sharp dissection and carried down to the abdominal cavity at 0 degree, 10 mm laparoscope was inserted through a 12-mm Hasson trocar, secured with a 0 Vicryl suture.  I placed 2 additional trocars, a 5 mm in the right upper quadrant and a 5 mm in the left lower quadrant.  I switched to a 5 mm angled scope.  The appendix was down in the pelvis, but I actually was able to retrieve down the pelvis easily.  It had early acute inflammatory changes consistent  with acute appendicitis.  There was minimal purulence.  I took down the mesentery of the appendix with a Harmonic Scalpel.  I catch the base of the appendix and I fired a 45 mm Ethicon Endo-GIA white load of staples across the base of the appendix.  I placed the appendix in EndoCatch bag and delivered it through the umbilicus.  I then irrigated the abdomen with 500 mL of saline.  There was no bleeding.  The rest of her abdominal exploration was unremarkable. There are bowel, colon, liver, and stomach all looked grossly unremarkable that which I could see.  The trocars were removed.  The umbilical port closed with 0 Vicryl suture.  I used 14 mL of 0.25% Marcaine.  I closed the skin with a 5-0 Vicryl, painted the wound with Dermabond, and sterilely draped.  The patient was transported to recovery room in good condition.  Sponge, needle count were correct at the end of the case.   Sandria Bales. Ezzard Standing, M.D., FACS   DHN/MEDQ  D:  03/14/2011  T:  03/14/2011  Job:  [id number]

## 2011-03-14 NOTE — Anesthesia Preprocedure Evaluation (Addendum)
Anesthesia Evaluation  Patient identified by MRN, date of birth, ID band Patient awake    Reviewed: Allergy & Precautions, H&P , NPO status , Patient's Chart, lab work & pertinent test results, reviewed documented beta blocker date and time   Airway Mallampati: II      Dental  (+) Teeth Intact,    Pulmonary COPD         Cardiovascular neg cardio ROS     Neuro/Psych  Headaches,    GI/Hepatic Neg liver ROS,   Endo/Other  Negative Endocrine ROS  Renal/GU negative Renal ROS  Genitourinary negative   Musculoskeletal negative musculoskeletal ROS (+)   Abdominal   Peds  Hematology   Anesthesia Other Findings   Reproductive/Obstetrics negative OB ROS                           Anesthesia Physical Anesthesia Plan  ASA: II  Anesthesia Plan: General   Post-op Pain Management:    Induction: Intravenous, Rapid sequence and Cricoid pressure planned  Airway Management Planned: Oral ETT  Additional Equipment:   Intra-op Plan:   Post-operative Plan: Extubation in OR  Informed Consent: I have reviewed the patients History and Physical, chart, labs and discussed the procedure including the risks, benefits and alternatives for the proposed anesthesia with the patient or authorized representative who has indicated his/her understanding and acceptance.   Dental advisory given  Plan Discussed with: CRNA and Surgeon  Anesthesia Plan Comments:       Anesthesia Quick Evaluation

## 2011-03-14 NOTE — Plan of Care (Signed)
Problem: Consults Goal: Nutrition Consult-if indicated NPO

## 2011-03-14 NOTE — Brief Op Note (Signed)
03/13/2011 - 03/14/2011  10:38 AM  PATIENT:  Nicole Cisneros, 40 y.o., female, MRN: [id number]  PREOP DIAGNOSIS:  acute appendicitis  POSTOP DIAGNOSIS:   Acute Appendicitis  PROCEDURE:   Procedure(s): APPENDECTOMY LAPAROSCOPIC  SURGEON:   Ovidio Kin, M.D.  ASSISTANT:   none  ANESTHESIA:   general  Margaree Mackintosh - CRNA Aubery Lapping, MD - Anesthesiologist  General  EBL:  minimal  ml  BLOOD ADMINISTERED: none  DRAINS: none   LOCAL MEDICATIONS USED:   14 cc 1/4% marcaine   SPECIMEN:   appendix  COUNTS CORRECT:  YES  INDICATIONS FOR PROCEDURE:  Nicole Cisneros is a 40 y.o. (DOB: [birth year]) AA female whose primary care physician is Dorrene German, MD and comes for appendectomy.   The indications and risks of the surgery were explained to the patient.  The risks include, but are not limited to, infection, bleeding, and nerve injury.  Note dictated to:   [id number] D. Ezzard Standing 03/14/2011

## 2011-03-14 NOTE — Interval H&P Note (Signed)
History and Physical Interval Note:  03/14/2011 9:23 AM  Nicole Cisneros  has presented today for surgery, with the diagnosis of acute appendicitis  The various methods of treatment have been discussed with the patient and family. After consideration of risks, benefits and other options for treatment, the patient has consented to  Procedure(s): APPENDECTOMY LAPAROSCOPIC as a surgical intervention .   The patients' history has been reviewed, patient examined, no change in status, stable for surgery.  I have reviewed the patients' chart and labs.  Questions were answered to the patient's satisfaction.    I spoke with patient, reviewed findings, her cousin, Nicole Cisneros, is at bedside, and will proceed with surgery.  They asked why this happened.  Her brother had appendicitis about 2 months ago.  Nicole Cisneros H

## 2011-03-15 MED ORDER — TRAMADOL HCL 50 MG PO TABS: 50.0000 mg | ORAL_TABLET | Freq: Four times a day (QID) | ORAL | Status: AC

## 2011-03-15 NOTE — Discharge Summary (Signed)
Physician Discharge Summary  Patient ID: Nicole Cisneros MRN: [id number] DOB/AGE: [birth year] 40 y.o.  Admit date: 03/13/2011 Discharge date: 03/15/2011  Admission Diagnoses: Acute appendicitis  Discharge Diagnoses: Same Principal Problem:  *Appendicitis   PROCEDURES: Acute appendicitis with laparoscopic appendectomy 03/14/2011 Dr. Alfredia Ferguson Course: Patient is a 40 year old female presented to the ER at [facility] with right lower quadrant periumbilical for pain started 2 PM prior to admission. She developed nausea. CT scan showed acute appendicitis with distention of the appendix to 1 cm. He was tracking deep into the right hemipelvis adjacent the fundus. No evidence of perforation. She also had a mildly thickened bladder wall suspicious for chronic cystitis and findings of an endometrial polyp. She was seen by Dr. Violeta Gelinas in the emergency room, she was admitted placed on antibiotic, and a recommendation for laparoscopic appendectomy was made. She was taken the OR later that day by Dr. Ovidio Kin. He performed laparoscopic appendectomy. Patient is returned to the floor has been hemodynamically stable overnight. She continues to do well this morning we plan to discharge her. She has successful pain control with tramadol. We'll send her home on that. Followup in 2 weeks in the [facility] clinic. Should she has instructions for post operative laparoscopic surgery and laparoscopic appendectomy. Condition on discharge: Improving. Will [facility] physician assistant for Dr. Manus Rudd.          Disposition: Final discharge disposition not confirmed   Current Discharge Medication List    START taking these medications   Details  acetaminophen (TYLENOL) 325 MG tablet Take 2 tablets (650 mg total) by mouth every 4 (four) hours as needed. Qty: 30 tablet, Refills: 0    traMADol (ULTRAM) 50 MG tablet Take 1 tablet (50 mg total) by mouth every 6 (six) hours. Maximum  dose= 8 tablets per day Qty: 40 tablet, Refills: 0       Follow-up Information    Follow up with [facility], MD. Make an appointment in 2 weeks. (Ask for the [facility] clinic)    Contact information:   [facility] [address], [address] [place] [address] [phone]          Signed: Sherrie George 03/15/2011, 8:09 AM

## 2011-03-15 NOTE — Progress Notes (Signed)
1 Day Post-Op  Subjective: Feels OK this AM. Ate some last PM has walked in room  Objective: Vital signs in last 24 hours: Temp:  [97.4 F (36.3 C)-98.4 F (36.9 C)] 98.2 F (36.8 C) (12/01 0420) Pulse Rate:  [50-83] 56  (12/01 0420) Resp:  [10-20] 20  (12/01 0420) BP: (108-145)/(52-89) 108/52 mmHg (12/01 0420) SpO2:  [77 %-100 %] 98 % (12/01 0420) Weight:  [56.7 kg (125 lb)] 125 lb (56.7 kg) (11/30 1400) Last BM Date: 03/12/11  Intake/Output from previous day: 11/30 0701 - 12/01 0700 In: 3291.7 [P.O.:540; I.V.:2751.7] Out: 1805 [Urine:1800; Blood:5] Intake/Output this shift:    General appearance: alert, cooperative and no distress GI: soft, non-tender; bowel sounds normal; no masses,  no organomegaly and incisions look good.  Lab Results:   [facility] 03/13/11 2226  WBC 8.4  HGB 11.4*  HCT 34.3*  PLT 249    BMET  Basename 03/13/11 2226  NA 137  K 3.6  CL 102  CO2 24  GLUCOSE 111*  BUN 6  CREATININE 0.53  CALCIUM 9.1   PT/INR No results found for this basename: LABPROT:2,INR:2 in the last 72 hours   Studies/Results: Ct Abdomen Pelvis W Contrast  03/14/2011  *RADIOLOGY REPORT*  Clinical Data: Right lower quadrant abdominal pain; nausea and vomiting.  CT ABDOMEN AND PELVIS WITH CONTRAST  Technique:  Multidetector CT imaging of the abdomen and pelvis was performed following the standard protocol during bolus administration of intravenous contrast.  Contrast: OMNIPAQUE IOHEXOL 300 MG/ML IV SOLN  Comparison: None.  Findings: The visualized lung bases are clear.  Three focal lesions are identified within the liver, measuring 5.3 cm, 4.6 cm and 2.2 cm in size.  These are hypodense, with peripheral nodular enhancement and gradual centripetal enhancement on delayed imaging, compatible with cavernous hemangiomas.  The liver is otherwise unremarkable in appearance.  The spleen is within normal limits.  The pancreas and adrenal glands are unremarkable.  The kidneys are  grossly unremarkable in appearance.  There is no evidence of hydronephrosis.  No renal or ureteral stones are seen, though evaluation for stones is suboptimal due to the phase of contrast enhancement.  No perinephric stranding is appreciated.  No free fluid is identified.  The small bowel is unremarkable in appearance.  The stomach is within normal limits.  No acute vascular abnormalities are seen.  The appendix is distended up to 1.0 cm in maximal diameter, tracking deep into the right hemipelvis adjacent to the fundus of the uterus.  Associated wall thickening is noted.  A trace amount of contrast is seen tracking into the appendix, but the abnormal caliber and apparent edema involving the appendix raises suspicion for appendicitis.  Trace free fluid is noted within the pelvis, slightly more prominent on the right.  No pericecal lymphadenopathy is seen.  The colon is filled with contrast to the level of the splenic flexure.  The sigmoid colon is decompressed and difficult to fully assess.  No definite colonic abnormalities are seen.  The bladder is largely decompressed and markedly thick-walled, raising suspicion for chronic cystitis.  Within the uterus, there appears to be a small 1.0 cm soft tissue density, which most likely reflects an endometrial polyp.  Endometrial malignancy cannot be entirely excluded.  The ovaries are grossly unremarkable in appearance; no suspicious adnexal masses are seen.  No inguinal lymphadenopathy is seen.  No acute osseous abnormalities are identified.  IMPRESSION:  1.  Suspect acute appendicitis, with distension of the appendix to 1.0  cm in maximal diameter, tracking deep into the right hemipelvis adjacent to the fundus of the uterus.  Associated appendiceal wall thickening noted.  Trace amount of contrast seen tracking into the appendix.  Trace free fluid noted within the pelvis, slightly more prominent on the right.  No evidence for perforation or abscess formation. 2.  Mildly  thick-walled bladder raises suspicion for chronic cystitis. 3.  Small 1.0 cm soft tissue density noted within the endometrial canal, most likely reflecting an endometrial polyp.  Endometrial malignancy cannot be entirely excluded.  Further evaluation is recommended, as deemed clinically appropriate. 4.  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  Findings were discussed with Dr. Sunnie Nielsen at 04:52 a.m. on 03/14/2011.  Original Report Authenticated By: Tonia Ghent, M.D.    Anti-infectives: Anti-infectives     Start     Dose/Rate Route Frequency Ordered Stop   03/14/11 0600   metroNIDAZOLE (FLAGYL) IVPB 500 mg  Status:  Discontinued        500 mg 100 mL/hr over 60 Minutes Intravenous Every 8 hours 03/14/11 0557 03/14/11 1214   03/14/11 0600   ciprofloxacin (CIPRO) IVPB 400 mg  Status:  Discontinued        400 mg 200 mL/hr over 60 Minutes Intravenous Every 12 hours 03/14/11 0557 03/14/11 1214         Current Facility-Administered Medications  Medication Dose Route Frequency Provider Last Rate Last Dose  . acetaminophen (TYLENOL) tablet 650 mg  650 mg Oral Q4H PRN Kandis Cocking, MD      . acetaminophen-codeine (TYLENOL #3) 300-30 MG per tablet 1-2 tablet  1-2 tablet Oral Q4H PRN Sherrie George, PA      . dextrose 5 % and 0.45 % NaCl with KCl 20 mEq/L infusion   Intravenous Continuous Kandis Cocking, MD 100 mL/hr at 03/15/11 0400    . morphine 2 MG/ML injection 1-3 mg  1-3 mg Intravenous Q2H PRN Kandis Cocking, MD   2 mg at 03/14/11 1819  . ondansetron (ZOFRAN) tablet 4 mg  4 mg Oral Q6H PRN Kandis Cocking, MD       Or  . ondansetron [facility]) injection 4 mg  4 mg Intravenous Q6H PRN Kandis Cocking, MD      . traMADol Janean Sark) tablet 50 mg  50 mg Oral Q6H Kandis Cocking, MD   50 mg at 03/15/11 5621  . DISCONTD: bupivacaine (MARCAINE) 0.25 % injection    PRN Kandis Cocking, MD   12 mL at 03/14/11 1020  . DISCONTD:  ciprofloxacin (CIPRO) IVPB 400 mg  400 mg Intravenous Q12H Liz Malady, MD      . DISCONTD: dextrose 5 % and 0.45 % NaCl with KCl 20 mEq/L infusion   Intravenous Continuous Liz Malady, MD      . DISCONTD: diphenhydrAMINE (BENADRYL) 12.5 MG/5ML elixir 12.5-25 mg  12.5-25 mg Oral Q6H PRN Liz Malady, MD      . DISCONTD: diphenhydrAMINE (BENADRYL) injection 12.5-25 mg  12.5-25 mg Intravenous Q6H PRN Liz Malady, MD      . DISCONTD: HYDROmorphone (DILAUDID) injection 0.25-0.5 mg  0.25-0.5 mg Intravenous Q5 min PRN Aubery Lapping, MD   0.5 mg at 03/14/11 1136  . DISCONTD: HYDROmorphone (DILAUDID) injection 1 mg  1 mg Intravenous Q2H PRN Liz Malady, MD      . DISCONTD: lactated ringers infusion   Intravenous Continuous Otelia Sergeant  Michelle Piper, MD 50 mL/hr at 03/14/11 0858    . DISCONTD: meperidine (DEMEROL) injection 6.25-12.5 mg  6.25-12.5 mg Intravenous Q5 min PRN Aubery Lapping, MD      . DISCONTD: metroNIDAZOLE (FLAGYL) IVPB 500 mg  500 mg Intravenous Q8H Liz Malady, MD      . DISCONTD: nicotine (NICODERM CQ - dosed in mg/24 hours) patch 14 mg  14 mg Transdermal Daily Liz Malady, MD      . DISCONTD: ondansetron [facility]) injection 4 mg  4 mg Intravenous Q6H PRN Liz Malady, MD      . DISCONTD: ondansetron [facility]) injection 4 mg  4 mg Intravenous Once PRN Aubery Lapping, MD      . DISCONTD: pantoprazole (PROTONIX) injection 40 mg  40 mg Intravenous QHS Liz Malady, MD      . DISCONTD: sodium chloride irrigation 0.9 %    PRN Kandis Cocking, MD   1 application at 03/14/11 1018   Facility-Administered Medications Ordered in Other Encounters  Medication Dose Route Frequency Provider Last Rate Last Dose  . DISCONTD: cefOXitin (MEFOXIN) 1 g in dextrose 5 % 50 mL IVPB  1 g  Continuous PRN Lori Johnson   1 g at 03/14/11 0938  . DISCONTD: fentaNYL (SUBLIMAZE) injection    PRN Margaree Mackintosh   50 mcg at 03/14/11 1012  . DISCONTD: glycopyrrolate (ROBINUL)  injection    PRN Margaree Mackintosh   0.5 mg at 03/14/11 1027  . DISCONTD: lactated ringers infusion    Continuous PRN Margaree Mackintosh      . DISCONTD: lidocaine (cardiac) 100 mg/66ml (XYLOCAINE) 20 MG/ML injection 2%    PRN Lori Johnson   50 mg at 03/14/11 0939  . DISCONTD: midazolam (VERSED) 5 MG/5ML injection    PRN Margaree Mackintosh   2 mg at 03/14/11 0929  . DISCONTD: neostigmine (PROSTIGMINE) injection   Intravenous PRN Margaree Mackintosh   2.5 mg at 03/14/11 1027  . DISCONTD: ondansetron (ZOFRAN) injection    PRN Margaree Mackintosh   4 mg at 03/14/11 1013  . DISCONTD: propofol (DIPRIVAN) 10 MG/ML infusion    PRN Lori Johnson   100 mg at 03/14/11 0939  . DISCONTD: rocuronium (ZEMURON) injection    PRN Margaree Mackintosh   20 mg at 03/14/11 0948  . DISCONTD: succinylcholine (ANECTINE) injection    PRN Margaree Mackintosh   100 mg at 03/14/11 1610    Assessment/Plan POD 1 Lap Appy Plan: mobilize and D/C home later this AM.   LOS: 2 days    Graylon Amory 03/15/2011

## 2011-03-15 NOTE — Discharge Summary (Signed)
Agree with above.    D/C home.   

## 2011-03-15 NOTE — Progress Notes (Signed)
Pt was given all discharge orders with  FU care, MEDS, RX  Pt understood all orders care of her incisions pt understood signs of infection call MD  [address] [employer]

## 2011-03-18 ENCOUNTER — Encounter (HOSPITAL_COMMUNITY): Payer: Self-pay | Admitting: Surgery

## 2011-03-21 ENCOUNTER — Telehealth (INDEPENDENT_AMBULATORY_CARE_PROVIDER_SITE_OTHER): Payer: Self-pay

## 2011-03-21 NOTE — Telephone Encounter (Signed)
Pt called requesting RTW note. RTW note complete and at front desk for rtw date of 03-28-11.

## 2011-03-26 ENCOUNTER — Encounter (INDEPENDENT_AMBULATORY_CARE_PROVIDER_SITE_OTHER): Payer: Self-pay

## 2011-04-04 ENCOUNTER — Ambulatory Visit (INDEPENDENT_AMBULATORY_CARE_PROVIDER_SITE_OTHER): Payer: Self-pay | Admitting: Radiology

## 2011-04-04 ENCOUNTER — Encounter (INDEPENDENT_AMBULATORY_CARE_PROVIDER_SITE_OTHER): Payer: Self-pay

## 2011-04-04 VITALS — BP 124/78 | HR 112 | Temp 97.9°F | Ht 63.0 in | Wt 121.4 lb

## 2011-04-04 DIAGNOSIS — K37 Unspecified appendicitis: Secondary | ICD-10-CM

## 2011-04-04 NOTE — Progress Notes (Signed)
Arnetra A Hinostroza 1971-04-02 [id number] 04/04/2011   History of Present Illness: Nicole Cisneros is a  40 y.o. female who presents today status post lap appy.  Pathology reveals appendicitis.  The patient is tolerating a regular diet, having normal bowel movements, has good pain control.  She  is back to most normal activities.   Physical Exam: Abd: soft, nontender, active bowel sounds, nondistended.  All incisions are well healed.  Impression: 1.  Acute appendicitis, s/p lap appy  Plan: She  is able to return to normal activities. She  may follow up on a prn basis.

## 2012-01-01 ENCOUNTER — Emergency Department (INDEPENDENT_AMBULATORY_CARE_PROVIDER_SITE_OTHER)
Admission: EM | Admit: 2012-01-01 | Discharge: 2012-01-01 | Disposition: A | Discharge status: Home / Self Care | Payer: Self-pay | Source: Home / Self Care | Attending: Emergency Medicine | Admitting: Emergency Medicine

## 2012-01-01 ENCOUNTER — Encounter (HOSPITAL_COMMUNITY): Payer: Self-pay | Admitting: *Deleted

## 2012-01-01 DIAGNOSIS — K2100 Gastro-esophageal reflux disease with esophagitis, without bleeding: Secondary | ICD-10-CM

## 2012-01-01 DIAGNOSIS — K0520 Aggressive periodontitis, unspecified: Secondary | ICD-10-CM

## 2012-01-01 DIAGNOSIS — K05219 Aggressive periodontitis, localized, unspecified severity: Secondary | ICD-10-CM

## 2012-01-01 HISTORY — DX: Chronic obstructive pulmonary disease, unspecified: J44.9

## 2012-01-01 MED ORDER — GI COCKTAIL ~~LOC~~
ORAL | Status: AC
  Filled 2012-01-01: qty 30

## 2012-01-01 MED ORDER — GI COCKTAIL ~~LOC~~
30.0000 mL | Freq: Once | ORAL | Status: AC
  Administered 2012-01-01: 30 mL via ORAL

## 2012-01-01 MED ORDER — CLINDAMYCIN HCL 300 MG PO CAPS: 300.0000 mg | ORAL_CAPSULE | Freq: Four times a day (QID) | ORAL | Status: DC

## 2012-01-01 MED ORDER — FAMOTIDINE 20 MG PO TABS: 20.0000 mg | ORAL_TABLET | Freq: Two times a day (BID) | ORAL | Status: DC

## 2012-01-01 MED ORDER — PANTOPRAZOLE SODIUM 40 MG PO TBEC: 40.0000 mg | DELAYED_RELEASE_TABLET | Freq: Every day | ORAL | Status: DC

## 2012-01-01 MED ORDER — SUCRALFATE 1 GM/10ML PO SUSP: 1.0000 g | Freq: Four times a day (QID) | ORAL | Status: DC

## 2012-01-01 MED ORDER — CHLORHEXIDINE GLUCONATE 0.12 % MT SOLN: OROMUCOSAL | Status: DC

## 2012-01-01 NOTE — ED Provider Notes (Signed)
History     CSN: [id number]  Arrival date & time 01/01/12  4098   First MD Initiated Contact with Patient 01/01/12 [phone]      Chief Complaint  Patient presents with  . Dental Pain    (Consider location/radiation/quality/duration/timing/severity/associated sxs/prior treatment) HPI Comments: Patient has 2 issues: First, patient reports painful, swollen gum left upper side over a severely decayed tooth. States that she's been expressing purulent drainage from this area for the past 3 months. Symptoms are worse when she presses on her gum. No alleviating factors. She's not tried anything for this. No nausea, vomiting, fevers, facial swelling. States that this tooth below the gum swelling has been "bad" for some time.  Second, patient reports sore throat starting this morning. Also reports burning sensation in her chest, which is worse when she lies down or when she eats spicy or greasy foods, waterbrash for the past several days. No URI symptoms. States this happens intermittently. No voice changes, drooling, trismus, difficulty breathing, fevers, ear pain. No known sick contacts. She is a smoker. She is not a diabetic.  ROS as noted in HPI. All other ROS negative.   Patient is a 41 y.o. female presenting with tooth pain and pharyngitis. The history is provided by the patient. No language interpreter was used.  Dental PainThe primary symptoms include mouth pain. The symptoms began more than 1 month ago. The symptoms are unchanged. The symptoms are chronic.  Additional symptoms include: dental sensitivity to temperature, gum swelling, gum tenderness, purulent gums and jaw pain. Additional symptoms do not include: trismus, facial swelling, trouble swallowing, drooling and ear pain. Medical issues include: smoking and periodontal disease.   Sore Throat This is a new problem. The current episode started 3 to 5 hours ago. The problem occurs constantly. The problem has not changed since onset.The  symptoms are aggravated by swallowing. Nothing relieves the symptoms. She has tried nothing for the symptoms. The treatment provided no relief.    Past Medical History  Diagnosis Date  . Headache   . Anemia   . Weight loss   . Sore throat   . Cough   . Constipation   . Weakness   . COPD (chronic obstructive pulmonary disease)     Past Surgical History  Procedure Date  . Tubal ligation   . Laparoscopic appendectomy 03/14/2011    Procedure: APPENDECTOMY LAPAROSCOPIC;  Surgeon: Kandis Cocking, MD;  Location: [facility] OR;  Service: General;  Laterality: N/A;  . Appendectomy 03/14/11    Family History  Problem Relation Age of Onset  . Cancer Father     bone  . Cancer Sister     breast    History  Substance Use Topics  . Smoking status: Current Every Day Smoker -- 1.0 packs/day  . Smokeless tobacco: Not on file  . Alcohol Use: No    OB History    Grav Para Term Preterm Abortions TAB SAB Ect Mult Living                  Review of Systems  HENT: Negative for ear pain, facial swelling, drooling and trouble swallowing.     Allergies  Amoxicillin; Erythromycin; Percocet; and Vicodin  Home Medications  No current outpatient prescriptions on file.  BP 137/93  Pulse 83  Temp 97.8 F (36.6 C) (Oral)  Resp 18  SpO2 99%  LMP 01/01/2012  Physical Exam  Nursing note and vitals reviewed. Constitutional: She is oriented to person, place, and time. She  appears well-developed and well-nourished. No distress.  HENT:  Head: Normocephalic and atraumatic.  Mouth/Throat: Uvula is midline and mucous membranes are normal. Abnormal dentition. Dental caries present. Posterior oropharyngeal erythema present.         Gingival swelling with a sinus, some purulent drainage see drawing.   Eyes: Conjunctivae normal and EOM are normal.  Neck: Normal range of motion.  Cardiovascular: Normal rate.   Pulmonary/Chest: Effort normal.  Abdominal: She exhibits no distension.  Musculoskeletal:  Normal range of motion.  Lymphadenopathy:    She has cervical adenopathy.       Right cervical: No superficial cervical, no deep cervical and no posterior cervical adenopathy present.      Left cervical: Superficial cervical adenopathy present.  Neurological: She is alert and oriented to person, place, and time. Coordination normal.  Skin: Skin is warm and dry.  Psychiatric: She has a normal mood and affect. Her behavior is normal. Judgment and thought content normal.    ED Course  Procedures (including critical care time)  Labs Reviewed - No data to display No results found.   1. Reflux esophagitis   2. Gingival abscess     MDM  Previous records reviewed. Has been given third-generation cephalosporin in the past. No documentation of anaphylaxis to erythromycin. Discussed with Dr. Mayford Knife, dentist on call. Will send patient home with Peridex and clindamycin x 7 days. patient states that she has taken clindamycin  in the past without problem. She is to call Dr. Mayford Knife today, and schedule an appointment.  Sore throat appears to be from reflux, will start her on Pepcid/protonix/Carafate. GI cocktail here. She'll followup with primary care physician as needed. Discussed signs and symptoms that should prompt return to the department. Patient agrees with plan.   Luiz Blare, MD 01/01/12 1054

## 2012-01-01 NOTE — ED Notes (Signed)
Pt reports bad tooth on top back left side that is causing headache, sore throat and general malise

## 2012-02-18 ENCOUNTER — Encounter (HOSPITAL_COMMUNITY): Payer: Self-pay | Admitting: *Deleted

## 2012-02-18 ENCOUNTER — Emergency Department (HOSPITAL_COMMUNITY)
Admission: EM | Admit: 2012-02-18 | Discharge: 2012-02-18 | Disposition: A | Discharge status: Home / Self Care | Payer: Self-pay | Attending: Emergency Medicine | Admitting: Emergency Medicine

## 2012-02-18 DIAGNOSIS — Z8719 Personal history of other diseases of the digestive system: Secondary | ICD-10-CM | POA: Insufficient documentation

## 2012-02-18 DIAGNOSIS — F17200 Nicotine dependence, unspecified, uncomplicated: Secondary | ICD-10-CM | POA: Insufficient documentation

## 2012-02-18 DIAGNOSIS — J358 Other chronic diseases of tonsils and adenoids: Secondary | ICD-10-CM

## 2012-02-18 DIAGNOSIS — Z862 Personal history of diseases of the blood and blood-forming organs and certain disorders involving the immune mechanism: Secondary | ICD-10-CM | POA: Insufficient documentation

## 2012-02-18 DIAGNOSIS — R220 Localized swelling, mass and lump, head: Secondary | ICD-10-CM | POA: Insufficient documentation

## 2012-02-18 DIAGNOSIS — J449 Chronic obstructive pulmonary disease, unspecified: Secondary | ICD-10-CM | POA: Insufficient documentation

## 2012-02-18 DIAGNOSIS — J359 Chronic disease of tonsils and adenoids, unspecified: Secondary | ICD-10-CM | POA: Insufficient documentation

## 2012-02-18 DIAGNOSIS — R221 Localized swelling, mass and lump, neck: Secondary | ICD-10-CM | POA: Insufficient documentation

## 2012-02-18 DIAGNOSIS — J4489 Other specified chronic obstructive pulmonary disease: Secondary | ICD-10-CM | POA: Insufficient documentation

## 2012-02-18 DIAGNOSIS — Z79899 Other long term (current) drug therapy: Secondary | ICD-10-CM | POA: Insufficient documentation

## 2012-02-18 MED ORDER — CEFTRIAXONE SODIUM 1 G IJ SOLR
750.0000 mg | Freq: Once | INTRAMUSCULAR | Status: AC
  Administered 2012-02-18: 750 mg via INTRAMUSCULAR
  Filled 2012-02-18: qty 10

## 2012-02-18 MED ORDER — CEFTRIAXONE SODIUM 250 MG IJ SOLR
250.0000 mg | Freq: Once | INTRAMUSCULAR | Status: AC
  Administered 2012-02-18: 250 mg via INTRAMUSCULAR
  Filled 2012-02-18: qty 250

## 2012-02-18 MED ORDER — ONDANSETRON 4 MG PO TBDP
4.0000 mg | ORAL_TABLET | Freq: Once | ORAL | Status: AC
  Administered 2012-02-18: 4 mg via ORAL
  Filled 2012-02-18: qty 1

## 2012-02-18 NOTE — ED Provider Notes (Signed)
History     CSN: [id number]  Arrival date & time 02/18/12  4098   First MD Initiated Contact with Patient 02/18/12 [phone]      Chief Complaint  Patient presents with  . Sore Throat  . Oral Swelling    (Consider location/radiation/quality/duration/timing/severity/associated sxs/prior treatment) HPI  Patient presents to the emergency department with complaints of tenderness to her throat. She says that she recently had a molar pulled because it was abscess. Her teeth are fine now. She says that when she touches her throat on the outside of her neck in the left side it is mildly sore. It she says she is mild discomfort when she swallows. She denies being unable to swallow or having a hard time breathing. She denies having any recent illness or any symptoms yesterday. She's been afebrile. Her imaging level of the normal. She's not had any nausea, vomiting, diarrhea, urinary symptoms. She is also not had any headaches or change in vision. nad vss  Past Medical History  Diagnosis Date  . Headache   . Anemia   . Weight loss   . Sore throat   . Cough   . Constipation   . Weakness   . COPD (chronic obstructive pulmonary disease)     Past Surgical History  Procedure Date  . Tubal ligation   . Laparoscopic appendectomy 03/14/2011    Procedure: APPENDECTOMY LAPAROSCOPIC;  Surgeon: Kandis Cocking, MD;  Location: [facility] OR;  Service: General;  Laterality: N/A;  . Appendectomy 03/14/11    Family History  Problem Relation Age of Onset  . Cancer Father     bone  . Cancer Sister     breast    History  Substance Use Topics  . Smoking status: Current Every Day Smoker -- 1.0 packs/day  . Smokeless tobacco: Not on file  . Alcohol Use: No    OB History    Grav Para Term Preterm Abortions TAB SAB Ect Mult Living                  Review of Systems  Review of Systems  Gen: no weight loss, fevers, chills, night sweats  Eyes: no discharge or drainage, no occular pain or visual changes    Nose: no epistaxis or rhinorrhea  Mouth: no dental pain, + mild  left tonsil tenderness Neck: no neck pain  Lungs:No wheezing, coughing or hemoptysis CV: no chest pain, palpitations, dependent edema or orthopnea  Abd: no abdominal pain, nausea, vomiting  GU: no dysuria or gross hematuria  MSK:  No abnormalities  Neuro: no headache, no focal neurologic deficits  Skin: no abnormalities Psyche: negative.   Allergies  Amoxicillin; Erythromycin; Percocet; and Vicodin  Home Medications   Current Outpatient Rx  Name  Route  Sig  Dispense  Refill  . FAMOTIDINE 20 MG PO TABS   Oral   Take 1 tablet (20 mg total) by mouth 2 (two) times daily.   40 tablet   0   . PANTOPRAZOLE SODIUM 40 MG PO TBEC   Oral   Take 1 tablet (40 mg total) by mouth daily.   20 tablet   0   . SUCRALFATE 1 GM/10ML PO SUSP   Oral   Take 10 mLs (1 g total) by mouth 4 (four) times daily. 10 mL before meals and before bedtime   240 mL   0   . CHLORHEXIDINE GLUCONATE 0.12 % MT SOLN      15 mL swish and spit bid  480 mL   0     BP 121/73  Pulse 72  Temp 97.9 F (36.6 C) (Oral)  Resp 18  SpO2 100%  LMP 02/17/2012  Physical Exam  Nursing note and vitals reviewed. Constitutional: She is oriented to person, place, and time. She appears well-developed and well-nourished. No distress.  HENT:  Head: Normocephalic and atraumatic. No trismus in the jaw.  Right Ear: Tympanic membrane, external ear and ear canal normal.  Left Ear: Tympanic membrane, external ear and ear canal normal.  Nose: Nose normal. No rhinorrhea. Right sinus exhibits no maxillary sinus tenderness and no frontal sinus tenderness. Left sinus exhibits no maxillary sinus tenderness and no frontal sinus tenderness.  Mouth/Throat: Uvula is midline and mucous membranes are normal. Normal dentition. No dental abscesses or uvula swelling. No oropharyngeal exudate, posterior oropharyngeal edema, posterior oropharyngeal erythema or tonsillar  abscesses.       No submental edema, tongue not elevated, no trismus. No impending airway obstruction; Pt able to speak full sentences, swallow intact, no drooling, stridor, or tonsillar/uvula displacement. No palatal petechia  Eyes: Conjunctivae normal are normal.  Neck: Trachea normal, normal range of motion and full passive range of motion without pain. Neck supple. No rigidity. Normal range of motion present. No Brudzinski's sign noted.       Flexion and extension of neck without pain or difficulty. Able to breath without difficulty in extension.  Cardiovascular: Normal rate and regular rhythm.   Pulmonary/Chest: Effort normal and breath sounds normal. No stridor. No respiratory distress. She has no wheezes.  Abdominal: Soft. There is no tenderness.       No obvious evidence of splenomegaly. Non ttp.   Musculoskeletal: Normal range of motion.  Lymphadenopathy:       Head (right side): No tonsillar, no preauricular and no posterior auricular adenopathy present.       Head (left side): No tonsillar (mld tenderness to left tonsil), no preauricular and no posterior auricular adenopathy present.    She has no cervical adenopathy.  Neurological: She is alert and oriented to person, place, and time.  Skin: Skin is warm and dry. No rash noted. She is not diaphoretic.  Psychiatric: She has a normal mood and affect.    ED Course  Procedures (including critical care time)  Labs Reviewed - No data to display No results found.   1. Tonsillar erythema       MDM  Patient has been given a shot of antibiotics in the ER for sore throat. Has been given a prescription for pain medication. Has been advised on symptoms that warrant a return visit. At this time pt has no, trismus, obstructed airway, fevers. Pt is handeling her secretions well.   She describes the pain as being very mild and starting this morning. It hurts more on the left than the right but the tonsil is not swollen, the tonsil node  is tender. She is not tachycardic or febrile. It could be the very very very beginning stages of a tonsilar abscess but as of right now her only symptom is mild discomfort. Due to her allergies to amoxicillin, Clindamycin, and erythromycin, I have decided to give 1g or Rocephin IM in the ER as the patient did not want to "worry about takin some pills". I have made it very clear that her threshold for returning to the ER for re-evaluation should be very low. And explained the risk for developing tonsilar abscess and how those are very serious. She voices her  understanding. referal to ENT given.  Pt has been advised of the symptoms that warrant their return to the ED. Patient has voiced understanding and has agreed to follow-up with the PCP or specialist.               Dorthula Matas, PA 02/18/12 1007

## 2012-02-18 NOTE — ED Notes (Signed)
Pt has histroy of acid reflux, now has has burning sensation in throat more on left side, also had tooth upper post left molar pulled and has swelling at site

## 2012-02-19 ENCOUNTER — Encounter (HOSPITAL_COMMUNITY): Payer: Self-pay | Admitting: *Deleted

## 2012-02-19 ENCOUNTER — Emergency Department (HOSPITAL_COMMUNITY)
Admission: EM | Admit: 2012-02-19 | Discharge: 2012-02-20 | Disposition: A | Discharge status: Home / Self Care | Payer: Self-pay | Attending: Emergency Medicine | Admitting: Emergency Medicine

## 2012-02-19 DIAGNOSIS — K5900 Constipation, unspecified: Secondary | ICD-10-CM | POA: Insufficient documentation

## 2012-02-19 DIAGNOSIS — J449 Chronic obstructive pulmonary disease, unspecified: Secondary | ICD-10-CM | POA: Insufficient documentation

## 2012-02-19 DIAGNOSIS — Z862 Personal history of diseases of the blood and blood-forming organs and certain disorders involving the immune mechanism: Secondary | ICD-10-CM | POA: Insufficient documentation

## 2012-02-19 DIAGNOSIS — R110 Nausea: Secondary | ICD-10-CM | POA: Insufficient documentation

## 2012-02-19 DIAGNOSIS — Z8669 Personal history of other diseases of the nervous system and sense organs: Secondary | ICD-10-CM | POA: Insufficient documentation

## 2012-02-19 DIAGNOSIS — F17200 Nicotine dependence, unspecified, uncomplicated: Secondary | ICD-10-CM | POA: Insufficient documentation

## 2012-02-19 DIAGNOSIS — R51 Headache: Secondary | ICD-10-CM | POA: Insufficient documentation

## 2012-02-19 DIAGNOSIS — J4489 Other specified chronic obstructive pulmonary disease: Secondary | ICD-10-CM | POA: Insufficient documentation

## 2012-02-19 HISTORY — DX: Migraine, unspecified, not intractable, without status migrainosus: G43.909

## 2012-02-19 MED ORDER — DEXAMETHASONE SODIUM PHOSPHATE 10 MG/ML IJ SOLN
10.0000 mg | Freq: Once | INTRAMUSCULAR | Status: AC
  Administered 2012-02-19: 10 mg via INTRAVENOUS
  Filled 2012-02-19: qty 1

## 2012-02-19 MED ORDER — SODIUM CHLORIDE 0.9 % IV BOLUS (SEPSIS)
1000.0000 mL | Freq: Once | INTRAVENOUS | Status: AC
  Administered 2012-02-19: 1000 mL via INTRAVENOUS

## 2012-02-19 MED ORDER — ONDANSETRON HCL 4 MG/2ML IJ SOLN
4.0000 mg | Freq: Once | INTRAMUSCULAR | Status: AC
  Administered 2012-02-19: 4 mg via INTRAVENOUS
  Filled 2012-02-19: qty 2

## 2012-02-19 MED ORDER — DIPHENHYDRAMINE HCL 50 MG/ML IJ SOLN
25.0000 mg | Freq: Once | INTRAMUSCULAR | Status: AC
  Administered 2012-02-19: 25 mg via INTRAVENOUS
  Filled 2012-02-19: qty 1

## 2012-02-19 MED ORDER — METOCLOPRAMIDE HCL 5 MG/ML IJ SOLN
10.0000 mg | Freq: Once | INTRAMUSCULAR | Status: AC
  Administered 2012-02-19: 10 mg via INTRAVENOUS
  Filled 2012-02-19: qty 2

## 2012-02-19 NOTE — ED Notes (Signed)
Peter, PA at bedside.

## 2012-02-19 NOTE — ED Notes (Signed)
Pt noted to have chills.

## 2012-02-19 NOTE — ED Provider Notes (Signed)
History     CSN: [id number]  Arrival date & time 02/19/12  Ernestina Columbia   First MD Initiated Contact with Patient 02/19/12 2218      Chief Complaint  Patient presents with  . Headache   HPI  History provided by the patient. Patient is a 41 year old female with history of COPD and migraine headaches who presents with complaints of left-sided migraine-type headache. Patient reports feeling ill last several days with sore throat. She was seen and evaluated yesterday and given a one-time treatment of antibiotics for her sore throat symptoms. After returning home she began to develop gradual onset of left-sided headache similar to previous migraine type headaches. She took Tylenol without any improvement. Headache has continued to be persistent throughout the day today. Symptoms are made worse with bright light and noise is. She denies any associated nausea vomiting but does report some lightheadedness. Denies any visual changes. Denies any confusion, speech change, weakness or numbness in extremities.    Past Medical History  Diagnosis Date  . Headache   . Anemia   . Weight loss   . Sore throat   . Cough   . Constipation   . Weakness   . COPD (chronic obstructive pulmonary disease)   . Migraine     Past Surgical History  Procedure Date  . Tubal ligation   . Laparoscopic appendectomy 03/14/2011    Procedure: APPENDECTOMY LAPAROSCOPIC;  Surgeon: Kandis Cocking, MD;  Location: [facility] OR;  Service: General;  Laterality: N/A;  . Appendectomy 03/14/11    Family History  Problem Relation Age of Onset  . Cancer Father     bone  . Cancer Sister     breast    History  Substance Use Topics  . Smoking status: Current Every Day Smoker -- 1.0 packs/day  . Smokeless tobacco: Not on file  . Alcohol Use: No    OB History    Grav Para Term Preterm Abortions TAB SAB Ect Mult Living                  Review of Systems  Constitutional: Negative for fever, chills and appetite change.  Eyes:  Positive for photophobia. Negative for visual disturbance.  Gastrointestinal: Positive for nausea. Negative for vomiting, abdominal pain, diarrhea and constipation.  Skin: Negative for rash.  Neurological: Positive for headaches. Negative for weakness and numbness.    Allergies  Amoxicillin; Erythromycin; Percocet; and Vicodin  Home Medications   Current Outpatient Rx  Name  Route  Sig  Dispense  Refill  . FAMOTIDINE 20 MG PO TABS   Oral   Take 1 tablet (20 mg total) by mouth 2 (two) times daily.   40 tablet   0   . CHLORHEXIDINE GLUCONATE 0.12 % MT SOLN      15 mL swish and spit bid   480 mL   0     BP 120/91  Pulse 71  Temp 98.5 F (36.9 C) (Oral)  Resp 20  Wt 129 lb 6 oz (58.684 kg)  SpO2 100%  LMP 02/17/2012  Physical Exam  Nursing note and vitals reviewed. Constitutional: She is oriented to person, place, and time. She appears well-developed and well-nourished. No distress.  HENT:  Head: Normocephalic and atraumatic.  Eyes: Conjunctivae normal and EOM are normal. Pupils are equal, round, and reactive to light.  Neck: Normal range of motion. Neck supple.       No meningeal signs  Cardiovascular: Normal rate and regular rhythm.   No  murmur heard. Pulmonary/Chest: Effort normal and breath sounds normal. No respiratory distress. She has no wheezes. She has no rales.  Abdominal: Soft. There is no tenderness. There is no rigidity, no rebound, no guarding, no CVA tenderness and no tenderness at McBurney's point.  Neurological: She is alert and oriented to person, place, and time. She has normal strength. No cranial nerve deficit or sensory deficit. Gait normal.  Skin: Skin is warm and dry. No rash noted.  Psychiatric: She has a normal mood and affect. Her behavior is normal.    ED Course  Procedures      1. Headache       MDM  10:25 PM patient seen and evaluated. Patient appears in mild discomfort but no acute distress. Patient with normal nonfocal neuro  exam. No concerning or red flag symptoms related to headache.   Patient reports feeling significant improvement with near complete resolution of headache after migraine cocktail. She is requesting to return home at this time. Patient will followup with PCP.     Angus Seller, [place] 02/19/12 2328

## 2012-02-19 NOTE — ED Notes (Signed)
Patient is alert and oriented x3.  She was seen here yesterday for tonsils and throat pain. She received a prescription for antibiotics and went home.  At 8:30 last night she states That she started getting a headache and it has not stopped since.  She currently rates Her pain 9 of 10.  She has a history of migraines and describes her headache as behind Her left eye with nausea and sensitivity to light

## 2012-02-21 NOTE — ED Provider Notes (Signed)
Medical screening examination/treatment/procedure(s) were performed by non-physician practitioner and as supervising physician I was immediately available for consultation/collaboration.   Rodricus Candelaria M Prestyn Mahn, DO 02/21/12 2018 

## 2012-02-21 NOTE — ED Provider Notes (Signed)
Medical screening examination/treatment/procedure(s) were performed by non-physician practitioner and as supervising physician I was immediately available for consultation/collaboration.   Tyr Franca M Jovann Luse, DO 02/21/12 2017 

## 2012-05-10 ENCOUNTER — Encounter (HOSPITAL_COMMUNITY): Payer: Self-pay | Admitting: Emergency Medicine

## 2012-05-10 ENCOUNTER — Emergency Department (HOSPITAL_COMMUNITY)
Admission: EM | Admit: 2012-05-10 | Discharge: 2012-05-10 | Disposition: A | Discharge status: Home / Self Care | Payer: Self-pay | Attending: Emergency Medicine | Admitting: Emergency Medicine

## 2012-05-10 DIAGNOSIS — J4489 Other specified chronic obstructive pulmonary disease: Secondary | ICD-10-CM | POA: Insufficient documentation

## 2012-05-10 DIAGNOSIS — Z862 Personal history of diseases of the blood and blood-forming organs and certain disorders involving the immune mechanism: Secondary | ICD-10-CM | POA: Insufficient documentation

## 2012-05-10 DIAGNOSIS — K219 Gastro-esophageal reflux disease without esophagitis: Secondary | ICD-10-CM | POA: Insufficient documentation

## 2012-05-10 DIAGNOSIS — R51 Headache: Secondary | ICD-10-CM | POA: Insufficient documentation

## 2012-05-10 DIAGNOSIS — J029 Acute pharyngitis, unspecified: Secondary | ICD-10-CM | POA: Insufficient documentation

## 2012-05-10 DIAGNOSIS — F17200 Nicotine dependence, unspecified, uncomplicated: Secondary | ICD-10-CM | POA: Insufficient documentation

## 2012-05-10 DIAGNOSIS — R110 Nausea: Secondary | ICD-10-CM | POA: Insufficient documentation

## 2012-05-10 DIAGNOSIS — J40 Bronchitis, not specified as acute or chronic: Secondary | ICD-10-CM | POA: Insufficient documentation

## 2012-05-10 DIAGNOSIS — Z8719 Personal history of other diseases of the digestive system: Secondary | ICD-10-CM | POA: Insufficient documentation

## 2012-05-10 DIAGNOSIS — R05 Cough: Secondary | ICD-10-CM | POA: Insufficient documentation

## 2012-05-10 DIAGNOSIS — Z8679 Personal history of other diseases of the circulatory system: Secondary | ICD-10-CM | POA: Insufficient documentation

## 2012-05-10 DIAGNOSIS — J449 Chronic obstructive pulmonary disease, unspecified: Secondary | ICD-10-CM | POA: Insufficient documentation

## 2012-05-10 DIAGNOSIS — R059 Cough, unspecified: Secondary | ICD-10-CM | POA: Insufficient documentation

## 2012-05-10 MED ORDER — AEROCHAMBER PLUS W/MASK MISC
Status: AC
  Administered 2012-05-10: 06:00:00
  Filled 2012-05-10: qty 1

## 2012-05-10 MED ORDER — KETOROLAC TROMETHAMINE 30 MG/ML IJ SOLN
15.0000 mg | Freq: Once | INTRAMUSCULAR | Status: AC
  Administered 2012-05-10: 15 mg via INTRAVENOUS
  Filled 2012-05-10: qty 1

## 2012-05-10 MED ORDER — GI COCKTAIL ~~LOC~~
30.0000 mL | Freq: Once | ORAL | Status: AC
  Administered 2012-05-10: 30 mL via ORAL
  Filled 2012-05-10: qty 30

## 2012-05-10 MED ORDER — METOCLOPRAMIDE HCL 5 MG/ML IJ SOLN
10.0000 mg | Freq: Once | INTRAMUSCULAR | Status: AC
  Administered 2012-05-10: 10 mg via INTRAMUSCULAR
  Filled 2012-05-10: qty 2

## 2012-05-10 MED ORDER — SODIUM CHLORIDE 0.9 % IV BOLUS (SEPSIS)
1000.0000 mL | Freq: Once | INTRAVENOUS | Status: AC
  Administered 2012-05-10: 1000 mL via INTRAVENOUS

## 2012-05-10 MED ORDER — ALBUTEROL SULFATE HFA 108 (90 BASE) MCG/ACT IN AERS
2.0000 | INHALATION_SPRAY | RESPIRATORY_TRACT | Status: DC | PRN
  Administered 2012-05-10: 2 via RESPIRATORY_TRACT
  Filled 2012-05-10: qty 6.7

## 2012-05-10 MED ORDER — PANTOPRAZOLE SODIUM 40 MG IV SOLR
40.0000 mg | Freq: Once | INTRAVENOUS | Status: AC
  Administered 2012-05-10: 40 mg via INTRAVENOUS
  Filled 2012-05-10: qty 40

## 2012-05-10 MED ORDER — DIPHENHYDRAMINE HCL 50 MG/ML IJ SOLN
25.0000 mg | Freq: Once | INTRAMUSCULAR | Status: AC
  Administered 2012-05-10: 25 mg via INTRAVENOUS
  Filled 2012-05-10: qty 1

## 2012-05-10 MED ORDER — LANSOPRAZOLE 15 MG PO CPDR: 15.0000 mg | DELAYED_RELEASE_CAPSULE | Freq: Every day | ORAL | Status: DC

## 2012-05-10 NOTE — ED Notes (Addendum)
PT. REPORTS GASTRIC REFLUX / EPIGASTRIC-CHEST "BURNING' , WITH HEADACHE ONSET THIS EVENING WITH NAUSEA AND PRODUCTIVE COUGH .

## 2012-05-10 NOTE — ED Provider Notes (Signed)
History     CSN: [id number]  Arrival date & time 05/10/12  0113   First MD Initiated Contact with Patient 05/10/12 0243      Chief Complaint  Patient presents with  . Gastrophageal Reflux    (Consider location/radiation/quality/duration/timing/severity/associated sxs/prior treatment) HPI This is a 42 year old female with a history of gastroesophageal reflux disease. She is not currently on any treatment for this. She is here with burning in her throat consistent with prior GERD. It is worse when lying supine. It is associated with a sour taste in the throat. It is severe enough to prevent her from sleeping. She is having some nausea but no vomiting with it. She's also complaining of a sharp headache behind her right eye. This started about 6:30 and has been coming and going. She has a history of similar headaches in the past. She attributes these headaches for head injury when she was a teenager.  In addition to the above she states she had "the flu" about 2 weeks ago and has had a persistent lingering cough since.  Past Medical History  Diagnosis Date  . Headache   . Anemia   . Weight loss   . Sore throat   . Cough   . Constipation   . Weakness   . COPD (chronic obstructive pulmonary disease)   . Migraine     Past Surgical History  Procedure Date  . Tubal ligation   . Laparoscopic appendectomy 03/14/2011    Procedure: APPENDECTOMY LAPAROSCOPIC;  Surgeon: Kandis Cocking, MD;  Location: [facility] OR;  Service: General;  Laterality: N/A;  . Appendectomy 03/14/11    Family History  Problem Relation Age of Onset  . Cancer Father     bone  . Cancer Sister     breast    History  Substance Use Topics  . Smoking status: Current Every Day Smoker -- 1.0 packs/day  . Smokeless tobacco: Not on file  . Alcohol Use: No    OB History    Grav Para Term Preterm Abortions TAB SAB Ect Mult Living                  Review of Systems  All other systems reviewed and are  negative.    Allergies  Amoxicillin; Erythromycin; Percocet; and Vicodin  Home Medications   Current Outpatient Rx  Name  Route  Sig  Dispense  Refill  . CHLORHEXIDINE GLUCONATE 0.12 % MT SOLN      15 mL swish and spit bid   480 mL   0   . FAMOTIDINE 20 MG PO TABS   Oral   Take 1 tablet (20 mg total) by mouth 2 (two) times daily.   40 tablet   0     BP 135/78  Temp 98.3 F (36.8 C) (Oral)  Resp 18  SpO2 99%  LMP 05/05/2012  Physical Exam General: Well-developed, thin female in no acute distress; appearance consistent with age of record HENT: normocephalic, atraumatic Eyes: pupils equal round and reactive to light; extraocular muscles intact Neck: supple Heart: regular rate and rhythm Lungs: Mildly decreased air movement bilaterally without wheezing; frequent cough Abdomen: soft; nondistended; nontender; bowel sounds present Extremities: No deformity; full range of motion Neurologic: Awake, alert and oriented; motor function intact in all extremities and symmetric; no facial droop Skin: Warm and dry Psychiatric: Normal mood and affect    ED Course  Procedures (including critical care time)    MDM  4:27 AM Patient's headache improved  after IV fluids, Benadryl, Reglan and Toradol. We will provide her an inhaler for her bronchitis. Will start her on a PPI for her GERD.          Hanley Seamen, MD 05/10/12 [phone]

## 2012-05-10 NOTE — ED Notes (Signed)
Instructions given on inhaler and spacer use.  Demonstrated back to this nurse.

## 2012-05-10 NOTE — ED Notes (Signed)
Patient c/o epigastric pain "like something is coming up in my throat", headache and cough.

## 2012-05-11 ENCOUNTER — Emergency Department (HOSPITAL_COMMUNITY)
Admission: EM | Admit: 2012-05-11 | Discharge: 2012-05-11 | Disposition: A | Discharge status: Home / Self Care | Payer: Self-pay | Attending: Emergency Medicine | Admitting: Emergency Medicine

## 2012-05-11 ENCOUNTER — Encounter (HOSPITAL_COMMUNITY): Payer: Self-pay | Admitting: *Deleted

## 2012-05-11 DIAGNOSIS — F17200 Nicotine dependence, unspecified, uncomplicated: Secondary | ICD-10-CM | POA: Insufficient documentation

## 2012-05-11 DIAGNOSIS — K219 Gastro-esophageal reflux disease without esophagitis: Secondary | ICD-10-CM | POA: Insufficient documentation

## 2012-05-11 DIAGNOSIS — J449 Chronic obstructive pulmonary disease, unspecified: Secondary | ICD-10-CM | POA: Insufficient documentation

## 2012-05-11 DIAGNOSIS — Z79899 Other long term (current) drug therapy: Secondary | ICD-10-CM | POA: Insufficient documentation

## 2012-05-11 DIAGNOSIS — R51 Headache: Secondary | ICD-10-CM | POA: Insufficient documentation

## 2012-05-11 DIAGNOSIS — H53149 Visual discomfort, unspecified: Secondary | ICD-10-CM | POA: Insufficient documentation

## 2012-05-11 DIAGNOSIS — H538 Other visual disturbances: Secondary | ICD-10-CM | POA: Insufficient documentation

## 2012-05-11 DIAGNOSIS — Z8709 Personal history of other diseases of the respiratory system: Secondary | ICD-10-CM | POA: Insufficient documentation

## 2012-05-11 DIAGNOSIS — J4489 Other specified chronic obstructive pulmonary disease: Secondary | ICD-10-CM | POA: Insufficient documentation

## 2012-05-11 DIAGNOSIS — Z862 Personal history of diseases of the blood and blood-forming organs and certain disorders involving the immune mechanism: Secondary | ICD-10-CM | POA: Insufficient documentation

## 2012-05-11 DIAGNOSIS — Z8679 Personal history of other diseases of the circulatory system: Secondary | ICD-10-CM | POA: Insufficient documentation

## 2012-05-11 DIAGNOSIS — R5381 Other malaise: Secondary | ICD-10-CM | POA: Insufficient documentation

## 2012-05-11 DIAGNOSIS — Z87828 Personal history of other (healed) physical injury and trauma: Secondary | ICD-10-CM | POA: Insufficient documentation

## 2012-05-11 LAB — CARBOXYHEMOGLOBIN
Carboxyhemoglobin: 2.8 % — ABNORMAL HIGH (ref 0.5–1.5)
Methemoglobin: 1.2 % (ref 0.0–1.5)
O2 Saturation: 24.7 %
Total hemoglobin: 9.8 g/dL — ABNORMAL LOW (ref 12.0–16.0)

## 2012-05-11 MED ORDER — CYCLOBENZAPRINE HCL 10 MG PO TABS: 5.0000 mg | ORAL_TABLET | Freq: Three times a day (TID) | ORAL | Status: DC | PRN

## 2012-05-11 MED ORDER — CYCLOBENZAPRINE HCL 10 MG PO TABS: 5.0000 mg | ORAL_TABLET | Freq: Once | ORAL | Status: DC

## 2012-05-11 MED ORDER — KETOROLAC TROMETHAMINE 30 MG/ML IJ SOLN
30.0000 mg | Freq: Once | INTRAMUSCULAR | Status: AC
  Administered 2012-05-11: 30 mg via INTRAVENOUS
  Filled 2012-05-11: qty 1

## 2012-05-11 MED ORDER — DIPHENHYDRAMINE HCL 50 MG/ML IJ SOLN
25.0000 mg | Freq: Once | INTRAMUSCULAR | Status: AC
  Administered 2012-05-11: 25 mg via INTRAVENOUS
  Filled 2012-05-11: qty 1

## 2012-05-11 MED ORDER — METOCLOPRAMIDE HCL 5 MG/ML IJ SOLN
10.0000 mg | Freq: Once | INTRAMUSCULAR | Status: AC
  Administered 2012-05-11: 10 mg via INTRAVENOUS
  Filled 2012-05-11: qty 2

## 2012-05-11 MED ORDER — SODIUM CHLORIDE 0.9 % IV BOLUS (SEPSIS)
1000.0000 mL | Freq: Once | INTRAVENOUS | Status: AC
  Administered 2012-05-11: 1000 mL via INTRAVENOUS

## 2012-05-11 MED ORDER — CYCLOBENZAPRINE HCL 10 MG PO TABS
10.0000 mg | ORAL_TABLET | Freq: Once | ORAL | Status: AC
  Administered 2012-05-11: 10 mg via ORAL
  Filled 2012-05-11: qty 1

## 2012-05-11 NOTE — ED Notes (Signed)
Pt discharged.Vital signs stable and GCS 15 
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
## 2013-03-12 ENCOUNTER — Emergency Department (HOSPITAL_COMMUNITY): Payer: Self-pay

## 2013-03-12 ENCOUNTER — Encounter (HOSPITAL_COMMUNITY): Payer: Self-pay | Admitting: Emergency Medicine

## 2013-03-12 ENCOUNTER — Emergency Department (HOSPITAL_COMMUNITY)
Admission: EM | Admit: 2013-03-12 | Discharge: 2013-03-12 | Disposition: A | Discharge status: Home / Self Care | Payer: Self-pay | Attending: Emergency Medicine | Admitting: Emergency Medicine

## 2013-03-12 DIAGNOSIS — R51 Headache: Secondary | ICD-10-CM | POA: Insufficient documentation

## 2013-03-12 DIAGNOSIS — Z8679 Personal history of other diseases of the circulatory system: Secondary | ICD-10-CM | POA: Insufficient documentation

## 2013-03-12 DIAGNOSIS — K219 Gastro-esophageal reflux disease without esophagitis: Secondary | ICD-10-CM | POA: Insufficient documentation

## 2013-03-12 DIAGNOSIS — F17200 Nicotine dependence, unspecified, uncomplicated: Secondary | ICD-10-CM | POA: Insufficient documentation

## 2013-03-12 DIAGNOSIS — Z3202 Encounter for pregnancy test, result negative: Secondary | ICD-10-CM | POA: Insufficient documentation

## 2013-03-12 DIAGNOSIS — Z862 Personal history of diseases of the blood and blood-forming organs and certain disorders involving the immune mechanism: Secondary | ICD-10-CM | POA: Insufficient documentation

## 2013-03-12 DIAGNOSIS — K5900 Constipation, unspecified: Secondary | ICD-10-CM | POA: Insufficient documentation

## 2013-03-12 DIAGNOSIS — J449 Chronic obstructive pulmonary disease, unspecified: Secondary | ICD-10-CM | POA: Insufficient documentation

## 2013-03-12 DIAGNOSIS — J4489 Other specified chronic obstructive pulmonary disease: Secondary | ICD-10-CM | POA: Insufficient documentation

## 2013-03-12 DIAGNOSIS — K047 Periapical abscess without sinus: Secondary | ICD-10-CM | POA: Insufficient documentation

## 2013-03-12 DIAGNOSIS — R079 Chest pain, unspecified: Secondary | ICD-10-CM | POA: Insufficient documentation

## 2013-03-12 LAB — COMPREHENSIVE METABOLIC PANEL
ALT: 11 U/L (ref 0–35)
AST: 17 U/L (ref 0–37)
Albumin: 3.8 g/dL (ref 3.5–5.2)
Alkaline Phosphatase: 57 U/L (ref 39–117)
BUN: 7 mg/dL (ref 6–23)
CO2: 27 mEq/L (ref 19–32)
Calcium: 9.1 mg/dL (ref 8.4–10.5)
Chloride: 104 mEq/L (ref 96–112)
Creatinine, Ser: 0.66 mg/dL (ref 0.50–1.10)
GFR calc Af Amer: >90 mL/min (ref >90)
GFR calc non Af Amer: >90 mL/min (ref >90)
Glucose, Bld: 81 mg/dL (ref 70–99)
Potassium: 3.6 mEq/L (ref 3.5–5.1)
Sodium: 139 mEq/L (ref 135–145)
Total Bilirubin: 0.1 mg/dL — ABNORMAL LOW (ref 0.3–1.2)
Total Protein: 7.4 g/dL (ref 6.0–8.3)

## 2013-03-12 LAB — URINALYSIS, ROUTINE W REFLEX MICROSCOPIC
Bilirubin Urine: NEGATIVE
Glucose, UA: NEGATIVE mg/dL
Hgb urine dipstick: NEGATIVE
Ketones, ur: NEGATIVE mg/dL
Nitrite: NEGATIVE
Protein, ur: NEGATIVE mg/dL
Specific Gravity, Urine: 1.021 (ref 1.005–1.030)
Urobilinogen, UA: 0.2 mg/dL (ref 0.0–1.0)
pH: 6.5 (ref 5.0–8.0)

## 2013-03-12 LAB — POCT I-STAT TROPONIN I: Troponin i, poc: 0 ng/mL (ref 0.00–0.08)

## 2013-03-12 LAB — CBC WITH DIFFERENTIAL/PLATELET
Basophils Absolute: 0 10*3/uL (ref 0.0–0.1)
Basophils Relative: 0 % (ref 0–1)
Eosinophils Absolute: 0.1 10*3/uL (ref 0.0–0.7)
Eosinophils Relative: 2 % (ref 0–5)
HCT: 27.6 % — ABNORMAL LOW (ref 36.0–46.0)
Hemoglobin: 9 g/dL — ABNORMAL LOW (ref 12.0–15.0)
Lymphocytes Relative: 33 % (ref 12–46)
Lymphs Abs: 1.5 10*3/uL (ref 0.7–4.0)
MCH: 25.6 pg — ABNORMAL LOW (ref 26.0–34.0)
MCHC: 32.6 g/dL (ref 30.0–36.0)
MCV: 78.6 fL (ref 78.0–100.0)
Monocytes Absolute: 0.3 10*3/uL (ref 0.1–1.0)
Monocytes Relative: 8 % (ref 3–12)
Neutro Abs: 2.6 10*3/uL (ref 1.7–7.7)
Neutrophils Relative %: 58 % (ref 43–77)
Platelets: 325 10*3/uL (ref 150–400)
RBC: 3.51 MIL/uL — ABNORMAL LOW (ref 3.87–5.11)
RDW: 19.5 % — ABNORMAL HIGH (ref 11.5–15.5)
WBC: 4.5 10*3/uL (ref 4.0–10.5)

## 2013-03-12 LAB — LIPASE, BLOOD: Lipase: 31 U/L (ref 11–59)

## 2013-03-12 LAB — URINE MICROSCOPIC-ADD ON

## 2013-03-12 LAB — POCT PREGNANCY, URINE: Preg Test, Ur: NEGATIVE

## 2013-03-12 MED ORDER — CLINDAMYCIN HCL 150 MG PO CAPS: 300.0000 mg | ORAL_CAPSULE | Freq: Three times a day (TID) | ORAL | Status: DC

## 2013-03-12 MED ORDER — FAMOTIDINE 20 MG PO TABS: 20.0000 mg | ORAL_TABLET | Freq: Two times a day (BID) | ORAL | Status: DC

## 2013-03-12 MED ORDER — TRAMADOL HCL 50 MG PO TABS: 50.0000 mg | ORAL_TABLET | Freq: Four times a day (QID) | ORAL | Status: DC | PRN

## 2013-03-12 MED ORDER — GI COCKTAIL ~~LOC~~
30.0000 mL | Freq: Once | ORAL | Status: AC
  Administered 2013-03-12: 30 mL via ORAL
  Filled 2013-03-12: qty 30

## 2013-03-12 MED ORDER — METRONIDAZOLE 500 MG PO TABS
2000.0000 mg | ORAL_TABLET | Freq: Once | ORAL | Status: AC
  Administered 2013-03-12: 2000 mg via ORAL
  Filled 2013-03-12: qty 4

## 2013-03-12 NOTE — ED Provider Notes (Signed)
CSN: [id number]     Arrival date & time 03/12/13  1415 History   First MD Initiated Contact with Patient 03/12/13 1621     Chief Complaint  Patient presents with  . Abdominal Pain  . Headache  . Chest Pain  . Dental Pain   (Consider location/radiation/quality/duration/timing/severity/associated sxs/prior Treatment) HPI Mireya A Muffley is a 42 y.o. female who presents to emergency department complaining of dental pain and abdominal pain. Patient states that her dental pain began 3 days ago. States that she had some purulent drainage out of her left upper tooth every morning and tenderness to palpation. States she tried oragel and ibuprofen with no relief. Pt states she also began having upper abdominal pain radiating into the chest. States feels like prior acid reflux. States took Mylanta with no improvement. Pt denies any fever, chills, nausea, vomiting, urinary symptoms. States has had difficulty with bowel movement "ever since i had my appendix removed."   Past Medical History  Diagnosis Date  . Headache(784.0)   . Anemia   . Weight loss   . Sore throat   . Cough   . Constipation   . Weakness   . COPD (chronic obstructive pulmonary disease)   . Migraine    Past Surgical History  Procedure Laterality Date  . Tubal ligation    . Laparoscopic appendectomy  03/14/2011    Procedure: APPENDECTOMY LAPAROSCOPIC;  Surgeon: Kandis Cocking, MD;  Location: [facility] OR;  Service: General;  Laterality: N/A;  . Appendectomy  03/14/11   Family History  Problem Relation Age of Onset  . Cancer Father     bone  . Cancer Sister     breast   History  Substance Use Topics  . Smoking status: Current Every Day Smoker -- 1.00 packs/day  . Smokeless tobacco: Not on file  . Alcohol Use: No   OB History   Grav Para Term Preterm Abortions TAB SAB Ect Mult Living                 Review of Systems  Constitutional: Negative for fever and chills.  HENT: Positive for dental problem.   Respiratory:  Negative for cough, chest tightness and shortness of breath.   Cardiovascular: Negative for chest pain, palpitations and leg swelling.  Gastrointestinal: Positive for abdominal pain and constipation. Negative for nausea, vomiting and diarrhea.  Genitourinary: Negative for dysuria, flank pain, vaginal bleeding, vaginal discharge, vaginal pain and pelvic pain.  Musculoskeletal: Negative for arthralgias, myalgias, neck pain and neck stiffness.  Skin: Negative for rash.  Neurological: Negative for dizziness, weakness and headaches.  All other systems reviewed and are negative.    Allergies  Amoxicillin; Erythromycin; Percocet; and Vicodin  Home Medications   Current Outpatient Rx  Name  Route  Sig  Dispense  Refill  . aspirin-acetaminophen-caffeine (EXCEDRIN MIGRAINE) 250-250-65 MG per tablet   Oral   Take 1-2 tablets by mouth every 8 (eight) hours as needed for headache.         . benzocaine (ORAJEL) 10 % mucosal gel   Mouth/Throat   Use as directed 1 application in the mouth or throat as needed for mouth pain.         Marland Kitchen ibuprofen (ADVIL,MOTRIN) 200 MG tablet   Oral   Take 600 mg by mouth every 8 (eight) hours as needed for headache or mild pain.          BP 116/73  Pulse 62  Temp(Src) 97.6 F (36.4 C) (Oral)  Resp  24  Ht 5\' 3"  (1.6 m)  Wt 131 lb 1.6 oz (59.467 kg)  BMI 23.23 kg/m2  SpO2 100%  LMP 03/05/2013 Physical Exam  Nursing note and vitals reviewed. Constitutional: She is oriented to person, place, and time. She appears well-developed and well-nourished. No distress.  HENT:  Head: Normocephalic.  Left upper first molar with caries, surrounding gum swelling and purulent drainage. Multiple cavities over multiple teeth.  Eyes: Conjunctivae are normal.  Neck: Neck supple.  Cardiovascular: Normal rate, regular rhythm and normal heart sounds.   Pulmonary/Chest: Effort normal and breath sounds normal. No respiratory distress. She has no wheezes. She has no rales.   Abdominal: Soft. Bowel sounds are normal. She exhibits no distension. There is tenderness. There is no rebound.  Epigastric tenderness  Musculoskeletal: She exhibits no edema.  Neurological: She is alert and oriented to person, place, and time.  Skin: Skin is warm and dry.  Psychiatric: She has a normal mood and affect. Her behavior is normal.    ED Course  Procedures (including critical care time) Labs Review Labs Reviewed  URINALYSIS, ROUTINE W REFLEX MICROSCOPIC - Abnormal; Notable for the following:    APPearance CLOUDY (*)    Leukocytes, UA MODERATE (*)    All other components within normal limits  COMPREHENSIVE METABOLIC PANEL - Abnormal; Notable for the following:    Total Bilirubin 0.1 (*)    All other components within normal limits  CBC WITH DIFFERENTIAL - Abnormal; Notable for the following:    RBC 3.51 (*)    Hemoglobin 9.0 (*)    HCT 27.6 (*)    MCH 25.6 (*)    RDW 19.5 (*)    All other components within normal limits  URINE MICROSCOPIC-ADD ON - Abnormal; Notable for the following:    Squamous Epithelial / LPF MANY (*)    All other components within normal limits  LIPASE, BLOOD  POCT PREGNANCY, URINE  POCT I-STAT TROPONIN I   Imaging Review Dg Abd Acute W/chest  03/12/2013   CLINICAL DATA:  Abdominal pain, shortness of breath, chest pain and nausea.  EXAM: ACUTE ABDOMEN SERIES (ABDOMEN 2 VIEW & CHEST 1 VIEW)  COMPARISON:  03/14/2011 CT  FINDINGS: The cardiomediastinal silhouette is unremarkable.  There is no evidence of focal airspace disease, pulmonary edema, suspicious pulmonary nodule/mass, pleural effusion, or pneumothorax.  The bowel gas pattern is unremarkable.  There is no bowel obstruction or pneumoperitoneum.  No suspicious is calcifications are identified.  No acute or suspicious bony abnormalities are noted.  IMPRESSION: Negative abdominal radiographs.  No acute cardiopulmonary disease.   Electronically Signed   By: Laveda Abbe M.D.   On: 03/12/2013 18:16     EKG Interpretation    Date/Time:  Saturday March 12 2013 14:20:08 EST Ventricular Rate:  59 PR Interval:  144 QRS Duration: 86 QT Interval:  396 QTC Calculation: 392 R Axis:   62 Text Interpretation:  Sinus bradycardia Nonspecific ST and T wave abnormality Abnormal ECG Sinus bradycardia (barely) ST-t wave abnormality Abnormal ekg Confirmed by Gerhard Munch  MD ([id number]) on 03/12/2013 6:28:48 PM            MDM   1. Abscess, dental   2. GERD (gastroesophageal reflux disease)        6:25 PM Patient reevaluated. Her abdominal pain resolved after GI cocktail. She continues to have a toothache. Her lab work and x-ray unremarkable. Her urine did show possible infection however she denies any urinary symptoms at this time. I  will send cultures. Will treat Trichomonas will 2 g of Flagyl by mouth. I will start patient on antibiotic for her dental abscess, pain medications, will start on Pepcid for her abdominal pain. Discussed change in diet and stopping smoking. Advised not to take any more ibuprofen. Followup with primary care Dr. She only has an appointment with her doctor at 4 PM on Monday which is in 2 days.   Lottie Mussel, PA-C 03/12/13 1837

## 2013-03-12 NOTE — ED Provider Notes (Signed)
  Medical screening examination/treatment/procedure(s) were performed by non-physician practitioner and as supervising physician I was immediately available for consultation/collaboration.  EKG Interpretation    Date/Time:  Saturday March 12 2013 14:20:08 EST Ventricular Rate:  59 PR Interval:  144 QRS Duration: 86 QT Interval:  396 QTC Calculation: 392 R Axis:   62 Text Interpretation:  Sinus bradycardia Nonspecific ST and T wave abnormality Abnormal ECG Sinus bradycardia (barely) ST-t wave abnormality Abnormal ekg Confirmed by Gerhard Munch  MD ([id number]) on 03/12/2013 6:28:48 PM               Gerhard Munch, MD 03/12/13 [phone]

## 2013-03-12 NOTE — ED Notes (Signed)
Pt here with migraine headache since Wednesday.  Pt reports left lower tooth pain for a while, sinus drainage.  Pt reports burning sensation from lower abdomen up into chest and reports sob

## 2013-03-14 ENCOUNTER — Ambulatory Visit: Payer: Self-pay

## 2013-03-14 ENCOUNTER — Ambulatory Visit: Payer: Self-pay | Attending: Internal Medicine | Admitting: Internal Medicine

## 2013-03-14 ENCOUNTER — Ambulatory Visit: Payer: Self-pay | Attending: Internal Medicine

## 2013-03-14 ENCOUNTER — Encounter: Payer: Self-pay | Admitting: Internal Medicine

## 2013-03-14 VITALS — BP 108/68 | HR 80 | Temp 98.6°F | Resp 17 | Ht 63.0 in | Wt 129.6 lb

## 2013-03-14 DIAGNOSIS — K047 Periapical abscess without sinus: Secondary | ICD-10-CM

## 2013-03-14 DIAGNOSIS — D649 Anemia, unspecified: Secondary | ICD-10-CM | POA: Insufficient documentation

## 2013-03-14 DIAGNOSIS — N920 Excessive and frequent menstruation with regular cycle: Secondary | ICD-10-CM

## 2013-03-14 DIAGNOSIS — Z139 Encounter for screening, unspecified: Secondary | ICD-10-CM

## 2013-03-14 DIAGNOSIS — K219 Gastro-esophageal reflux disease without esophagitis: Secondary | ICD-10-CM

## 2013-03-14 DIAGNOSIS — Z23 Encounter for immunization: Secondary | ICD-10-CM

## 2013-03-14 DIAGNOSIS — F17200 Nicotine dependence, unspecified, uncomplicated: Secondary | ICD-10-CM

## 2013-03-14 MED ORDER — FERROUS SULFATE 325 (65 FE) MG PO TABS: 325.0000 mg | ORAL_TABLET | Freq: Three times a day (TID) | ORAL | Status: DC

## 2013-03-14 NOTE — Progress Notes (Signed)
Patient here for follow up from ED Was seen over the weekend for acid reflux Has not yet had her prescriptions filled Was also seen for two abscesses in her mouth One on top and one on bottom to the left side of her mouth

## 2013-03-14 NOTE — Progress Notes (Signed)
Patient ID: Nicole Cisneros, female   DOB: November 04, 1970, 42 y.o.   MRN: [id number] MRN: [id number] Name: Nicole Cisneros  Sex: female Age: 42 y.o. DOB: 1971-04-06  Allergies: Amoxicillin; Erythromycin; Percocet; and Vicodin  Chief Complaint  Patient presents with  . Follow-up    HPI: Patient is 42 y.o. female who comes for the first time to establish medical care, patient went to the year recently with her symptoms of dental pain and was diagnosed with dental abscess, she was prescribed pain medication and antibiotic, she has prescriptions but has not filled yet, she also has history of GERD and was started on Pepcid, she denies any fever chills nausea vomiting. Electronic medical record reviewed patient had blood work done noticed anemia, patient reported to have menorrhagia has not seen any gynecologist.  Past Medical History  Diagnosis Date  . Headache(784.0)   . Anemia   . Weight loss   . Sore throat   . Cough   . Constipation   . Weakness   . COPD (chronic obstructive pulmonary disease)   . Migraine     Past Surgical History  Procedure Laterality Date  . Tubal ligation    . Laparoscopic appendectomy  03/14/2011    Procedure: APPENDECTOMY LAPAROSCOPIC;  Surgeon: Kandis Cocking, MD;  Location: [facility] OR;  Service: General;  Laterality: N/A;  . Appendectomy  03/14/11      Medication List       This list is accurate as of: 03/14/13  4:58 PM.  Always use your most recent med list.               aspirin-acetaminophen-caffeine 250-250-65 MG per tablet  Commonly known as:  EXCEDRIN MIGRAINE  Take 1-2 tablets by mouth every 8 (eight) hours as needed for headache.     benzocaine 10 % mucosal gel  Commonly known as:  ORAJEL  Use as directed 1 application in the mouth or throat as needed for mouth pain.     clindamycin 150 MG capsule  Commonly known as:  CLEOCIN  Take 2 capsules (300 mg total) by mouth 3 (three) times daily.     famotidine 20 MG tablet  Commonly known as:   PEPCID  Take 1 tablet (20 mg total) by mouth 2 (two) times daily.     ferrous sulfate 325 (65 FE) MG tablet  Take 1 tablet (325 mg total) by mouth 3 (three) times daily with meals.     ibuprofen 200 MG tablet  Commonly known as:  ADVIL,MOTRIN  Take 600 mg by mouth every 8 (eight) hours as needed for headache or mild pain.     traMADol 50 MG tablet  Commonly known as:  ULTRAM  Take 1 tablet (50 mg total) by mouth every 6 (six) hours as needed.        Meds ordered this encounter  Medications  . ferrous sulfate 325 (65 FE) MG tablet    Sig: Take 1 tablet (325 mg total) by mouth 3 (three) times daily with meals.    Dispense:  90 tablet    Refill:  3     There is no immunization history on file for this patient.  History  Substance Use Topics  . Smoking status: Current Every Day Smoker -- 1.00 packs/day for 12 years  . Smokeless tobacco: Not on file  . Alcohol Use: No    Review of Systems  As noted in HPI  Filed Vitals:   03/14/13 1630  BP: 108/68  Pulse: 80  Temp: 98.6 F (37 C)  Resp: 17    Physical Exam  Physical Exam  HENT:  Left upper first molar with caries, surrounding gum swelling no apparent  purulent drainage  Eyes: EOM are normal. Pupils are equal, round, and reactive to light.  Cardiovascular: Normal rate and regular rhythm.   Pulmonary/Chest: No respiratory distress. She has no wheezes. She has no rales.  Abdominal: Soft. There is no tenderness. There is no rebound.  Musculoskeletal: She exhibits no edema.    CBC    Component Value Date/Time   WBC 4.5 03/12/2013 1432   RBC 3.51* 03/12/2013 1432   HGB 9.0* 03/12/2013 1432   HCT 27.6* 03/12/2013 1432   PLT 325 03/12/2013 1432   MCV 78.6 03/12/2013 1432   LYMPHSABS 1.5 03/12/2013 1432   MONOABS 0.3 03/12/2013 1432   EOSABS 0.1 03/12/2013 1432   BASOSABS 0.0 03/12/2013 1432    CMP     Component Value Date/Time   NA 139 03/12/2013 1432   K 3.6 03/12/2013 1432   CL 104 03/12/2013 1432    CO2 27 03/12/2013 1432   GLUCOSE 81 03/12/2013 1432   BUN 7 03/12/2013 1432   CREATININE 0.66 03/12/2013 1432   CALCIUM 9.1 03/12/2013 1432   PROT 7.4 03/12/2013 1432   ALBUMIN 3.8 03/12/2013 1432   AST 17 03/12/2013 1432   ALT 11 03/12/2013 1432   ALKPHOS 57 03/12/2013 1432   BILITOT 0.1* 03/12/2013 1432   GFRNONAA >90 03/12/2013 1432   GFRAA >90 03/12/2013 1432    No results found for this basename: chol, tri, ldl    No components found with this basename: hga1c    Lab Results  Component Value Date/Time   AST 17 03/12/2013  2:32 PM    Assessment and Plan  Dental abscess - Plan: Advised patient to start antibiotic, pain medication when necessary and Ambulatory referral to Dentistry  GERD (gastroesophageal reflux disease)... advised for lifestyle modification and could smoking. Continue with Pepcid.  Screening - Plan: CBC with Differential, COMPLETE METABOLIC PANEL WITH GFR, Vit D  25 hydroxy (rtn osteoporosis monitoring), TSH  Menorrhagia - Plan: Ambulatory referral to Obstetrics / Gynecology  Anemia - Plan: Started patient on ferrous sulfate 325 (65 FE) MG tablet 3 times a day.  Smoking... patient will try to quit on her own.   flu shot given today.   Health Maintenance  -Pap Smear: Refeered to GYN   -Vaccinations:  Flu shot given today    Return in about 2 months (around 05/15/2013).  Doris Cheadle, MD

## 2013-05-16 ENCOUNTER — Other Ambulatory Visit: Payer: Self-pay

## 2013-05-20 ENCOUNTER — Ambulatory Visit: Payer: No Typology Code available for payment source | Attending: Internal Medicine | Admitting: Internal Medicine

## 2013-05-20 ENCOUNTER — Encounter: Payer: Self-pay | Admitting: Internal Medicine

## 2013-05-20 VITALS — BP 149/73 | HR 73 | Temp 98.7°F | Resp 14 | Ht 63.0 in | Wt 132.2 lb

## 2013-05-20 DIAGNOSIS — J4489 Other specified chronic obstructive pulmonary disease: Secondary | ICD-10-CM | POA: Insufficient documentation

## 2013-05-20 DIAGNOSIS — R05 Cough: Secondary | ICD-10-CM

## 2013-05-20 DIAGNOSIS — Z139 Encounter for screening, unspecified: Secondary | ICD-10-CM

## 2013-05-20 DIAGNOSIS — R059 Cough, unspecified: Secondary | ICD-10-CM | POA: Insufficient documentation

## 2013-05-20 DIAGNOSIS — J449 Chronic obstructive pulmonary disease, unspecified: Secondary | ICD-10-CM | POA: Insufficient documentation

## 2013-05-20 DIAGNOSIS — D649 Anemia, unspecified: Secondary | ICD-10-CM | POA: Insufficient documentation

## 2013-05-20 DIAGNOSIS — G43909 Migraine, unspecified, not intractable, without status migrainosus: Secondary | ICD-10-CM | POA: Insufficient documentation

## 2013-05-20 DIAGNOSIS — R209 Unspecified disturbances of skin sensation: Secondary | ICD-10-CM

## 2013-05-20 DIAGNOSIS — F17200 Nicotine dependence, unspecified, uncomplicated: Secondary | ICD-10-CM

## 2013-05-20 DIAGNOSIS — R202 Paresthesia of skin: Secondary | ICD-10-CM

## 2013-05-20 DIAGNOSIS — R200 Anesthesia of skin: Secondary | ICD-10-CM

## 2013-05-20 DIAGNOSIS — K219 Gastro-esophageal reflux disease without esophagitis: Secondary | ICD-10-CM

## 2013-05-20 DIAGNOSIS — R030 Elevated blood-pressure reading, without diagnosis of hypertension: Secondary | ICD-10-CM

## 2013-05-20 DIAGNOSIS — I10 Essential (primary) hypertension: Secondary | ICD-10-CM | POA: Insufficient documentation

## 2013-05-20 DIAGNOSIS — IMO0001 Reserved for inherently not codable concepts without codable children: Secondary | ICD-10-CM

## 2013-05-20 LAB — LIPID PANEL
Cholesterol: 161 mg/dL (ref 0–200)
HDL: 58 mg/dL (ref >39)
LDL Cholesterol: 87 mg/dL (ref 0–99)
Total CHOL/HDL Ratio: 2.8 Ratio
Triglycerides: 80 mg/dL (ref <150)
VLDL: 16 mg/dL (ref 0–40)

## 2013-05-20 LAB — COMPLETE METABOLIC PANEL WITH GFR
ALT: 8 U/L (ref 0–35)
AST: 13 U/L (ref 0–37)
Albumin: 4.1 g/dL (ref 3.5–5.2)
Alkaline Phosphatase: 47 U/L (ref 39–117)
BUN: 9 mg/dL (ref 6–23)
CO2: 27 mEq/L (ref 19–32)
Calcium: 9.2 mg/dL (ref 8.4–10.5)
Chloride: 106 mEq/L (ref 96–112)
Creat: 0.64 mg/dL (ref 0.50–1.10)
GFR, Est African American: >89 mL/min
GFR, Est Non African American: >89 mL/min
Glucose, Bld: 79 mg/dL (ref 70–99)
Potassium: 3.5 mEq/L (ref 3.5–5.3)
Sodium: 141 mEq/L (ref 135–145)
Total Bilirubin: 0.3 mg/dL (ref 0.2–1.2)
Total Protein: 7.1 g/dL (ref 6.0–8.3)

## 2013-05-20 LAB — TSH: TSH: 2.384 u[IU]/mL (ref 0.350–4.500)

## 2013-05-20 LAB — CBC WITH DIFFERENTIAL/PLATELET
Basophils Absolute: 0 10*3/uL (ref 0.0–0.1)
Basophils Relative: 1 % (ref 0–1)
Eosinophils Absolute: 0.1 10*3/uL (ref 0.0–0.7)
Eosinophils Relative: 1 % (ref 0–5)
HCT: 30.5 % — ABNORMAL LOW (ref 36.0–46.0)
Hemoglobin: 9.5 g/dL — ABNORMAL LOW (ref 12.0–15.0)
Lymphocytes Relative: 25 % (ref 12–46)
Lymphs Abs: 1.5 10*3/uL (ref 0.7–4.0)
MCH: 24.5 pg — ABNORMAL LOW (ref 26.0–34.0)
MCHC: 31.1 g/dL (ref 30.0–36.0)
MCV: 78.8 fL (ref 78.0–100.0)
Monocytes Absolute: 0.3 10*3/uL (ref 0.1–1.0)
Monocytes Relative: 6 % (ref 3–12)
Neutro Abs: 4.1 10*3/uL (ref 1.7–7.7)
Neutrophils Relative %: 67 % (ref 43–77)
Platelets: 275 10*3/uL (ref 150–400)
RBC: 3.87 MIL/uL (ref 3.87–5.11)
RDW: 19.9 % — ABNORMAL HIGH (ref 11.5–15.5)
WBC: 6 10*3/uL (ref 4.0–10.5)

## 2013-05-20 LAB — VITAMIN B12: Vitamin B-12: 314 pg/mL (ref 211–911)

## 2013-05-20 MED ORDER — BENZONATATE 100 MG PO CAPS: 100.0000 mg | ORAL_CAPSULE | Freq: Three times a day (TID) | ORAL | Status: DC | PRN

## 2013-05-20 MED ORDER — NICOTINE 21 MG/24HR TD PT24: 21.0000 mg | MEDICATED_PATCH | Freq: Every day | TRANSDERMAL | Status: DC

## 2013-05-20 NOTE — Progress Notes (Signed)
MRN: [id number] Name: Nicole Cisneros  Sex: female Age: 43 y.o. DOB: Feb 14, 1971  Allergies: Amoxicillin; Erythromycin; Percocet; and Vicodin  Chief Complaint  Patient presents with  . Follow-up    HPI: Patient is 43 y.o. female who comes today for followup, she has not done the blood work yet, denies any acute symptoms her today her blood pressure is elevated, she does have some history of hypertension denies any headache dizziness chest and shortness of breath, she still smokes cigarettes, she is going to try nicotine patch , she recently saw her dentist and cand had dental extraction done . Patient reported to have on and off numbness and tingling in both hands. Patient has occasional cough denies any fever chills.  Past Medical History  Diagnosis Date  . Headache(784.0)   . Anemia   . Weight loss   . Sore throat   . Cough   . Constipation   . Weakness   . COPD (chronic obstructive pulmonary disease)   . Migraine     Past Surgical History  Procedure Laterality Date  . Tubal ligation    . Laparoscopic appendectomy  03/14/2011    Procedure: APPENDECTOMY LAPAROSCOPIC;  Surgeon: Shann Medal, MD;  Location: [facility];  Service: General;  Laterality: N/A;  . Appendectomy  03/14/11      Medication List       This list is accurate as of: 05/20/13  3:22 PM.  Always use your most recent med list.               aspirin-acetaminophen-caffeine 250-250-65 MG per tablet  Commonly known as:  EXCEDRIN MIGRAINE  Take 1-2 tablets by mouth every 8 (eight) hours as needed for headache.     benzocaine 10 % mucosal gel  Commonly known as:  ORAJEL  Use as directed 1 application in the mouth or throat as needed for mouth pain.     benzonatate 100 MG capsule  Commonly known as:  TESSALON  Take 1 capsule (100 mg total) by mouth 3 (three) times daily as needed for cough.     clindamycin 150 MG capsule  Commonly known as:  CLEOCIN  Take 2 capsules (300 mg total) by mouth 3 (three)  times daily.     famotidine 20 MG tablet  Commonly known as:  PEPCID  Take 1 tablet (20 mg total) by mouth 2 (two) times daily.     ferrous sulfate 325 (65 FE) MG tablet  Take 1 tablet (325 mg total) by mouth 3 (three) times daily with meals.     ibuprofen 200 MG tablet  Commonly known as:  ADVIL,MOTRIN  Take 600 mg by mouth every 8 (eight) hours as needed for headache or mild pain.     nicotine 21 mg/24hr patch  Commonly known as:  NICODERM CQ  Place 1 patch (21 mg total) onto the skin daily.     traMADol 50 MG tablet  Commonly known as:  ULTRAM  Take 1 tablet (50 mg total) by mouth every 6 (six) hours as needed.        Meds ordered this encounter  Medications  . benzonatate (TESSALON) 100 MG capsule    Sig: Take 1 capsule (100 mg total) by mouth 3 (three) times daily as needed for cough.    Dispense:  30 capsule    Refill:  1  . nicotine (NICODERM CQ) 21 mg/24hr patch    Sig: Place 1 patch (21 mg total) onto the skin daily.  Dispense:  28 patch    Refill:  0     There is no immunization history on file for this patient.  Family History  Problem Relation Age of Onset  . Cancer Father     bone  . Cancer Sister     breast  . Heart disease Mother   . Hypertension Mother     History  Substance Use Topics  . Smoking status: Current Every Day Smoker -- 1.00 packs/day for 12 years  . Smokeless tobacco: Not on file  . Alcohol Use: No    Review of Systems   As noted in HPI  Filed Vitals:   05/20/13 1506  BP: 149/73  Pulse: 73  Temp: 98.7 F (37.1 C)  Resp: 14    Physical Exam  Physical Exam  Constitutional: No distress.  Eyes: EOM are normal. Pupils are equal, round, and reactive to light.  Cardiovascular: Normal rate and regular rhythm.   Pulmonary/Chest: Breath sounds normal. No respiratory distress. She has no wheezes. She has no rales.  Musculoskeletal: She exhibits no edema.    CBC    Component Value Date/Time   WBC 4.5 03/12/2013 1432    RBC 3.51* 03/12/2013 1432   HGB 9.0* 03/12/2013 1432   HCT 27.6* 03/12/2013 1432   PLT 325 03/12/2013 1432   MCV 78.6 03/12/2013 1432   LYMPHSABS 1.5 03/12/2013 1432   MONOABS 0.3 03/12/2013 1432   EOSABS 0.1 03/12/2013 1432   BASOSABS 0.0 03/12/2013 1432    CMP     Component Value Date/Time   NA 139 03/12/2013 1432   K 3.6 03/12/2013 1432   CL 104 03/12/2013 1432   CO2 27 03/12/2013 1432   GLUCOSE 81 03/12/2013 1432   BUN 7 03/12/2013 1432   CREATININE 0.66 03/12/2013 1432   CALCIUM 9.1 03/12/2013 1432   PROT 7.4 03/12/2013 1432   ALBUMIN 3.8 03/12/2013 1432   AST 17 03/12/2013 1432   ALT 11 03/12/2013 1432   ALKPHOS 57 03/12/2013 1432   BILITOT 0.1* 03/12/2013 1432   GFRNONAA >90 03/12/2013 1432   GFRAA >90 03/12/2013 1432    No results found for this basename: chol, tri, ldl    No components found with this basename: hga1c    Lab Results  Component Value Date/Time   AST 17 03/12/2013  2:32 PM    Assessment and Plan  GERD (gastroesophageal reflux disease) Continue with current medications.  Anemia Repeat CBC. Continue with iron supplement   Smoking - Plan: nicotine (NICODERM CQ) 21 mg/24hr patch  Elevated BP Advised patient follow salt diet.  Numbness and tingling - Plan: Will check Vitamin B12  Cough Tessalon pearls when necessary.  Screening - Plan: CBC with Differential, COMPLETE METABOLIC PANEL WITH GFR, TSH, Lipid panel, Vit D  25 hydroxy (rtn osteoporosis monitoring)   Return in about 3 months (around 08/17/2013).  Lorayne Marek, MD

## 2013-05-20 NOTE — Progress Notes (Signed)
Pt is here f/u. Pt has no complaints today. Just following up as instructed. Recent had a tooth pulled.

## 2013-05-21 LAB — VITAMIN D 25 HYDROXY (VIT D DEFICIENCY, FRACTURES): Vit D, 25-Hydroxy: 12 ng/mL — ABNORMAL LOW (ref 30–89)

## 2013-05-23 ENCOUNTER — Telehealth: Payer: Self-pay | Admitting: Emergency Medicine

## 2013-05-23 MED ORDER — VITAMIN D (ERGOCALCIFEROL) 1.25 MG (50000 UNIT) PO CAPS: 50000.0000 [IU] | ORAL_CAPSULE | ORAL | Status: DC

## 2013-05-23 NOTE — Addendum Note (Signed)
Addended by: Candie Chroman D on: 05/23/2013 12:13 PM   Modules accepted: Orders

## 2013-05-24 ENCOUNTER — Telehealth: Payer: Self-pay

## 2013-05-24 NOTE — Telephone Encounter (Signed)
Message copied by Dorothe Pea on Tue May 24, 2013  2:24 PM ------      Message from: Lorayne Marek      Created: Mon May 23, 2013  9:51 AM       Blood work reviewed, noticed low vitamin D, call patient advise to start ergocalciferol 50,000 units once a week for the duration of  12 weeks.       Anemia is improving, continue with iron pills, 3 times a day ------

## 2013-05-24 NOTE — Telephone Encounter (Signed)
Patient is aware of her lab results [facility] 

## 2013-06-06 ENCOUNTER — Ambulatory Visit: Payer: No Typology Code available for payment source | Admitting: Internal Medicine

## 2013-06-22 ENCOUNTER — Encounter: Payer: Self-pay | Admitting: Obstetrics & Gynecology

## 2013-08-15 ENCOUNTER — Encounter: Payer: Self-pay | Admitting: Internal Medicine

## 2013-08-15 ENCOUNTER — Ambulatory Visit: Payer: No Typology Code available for payment source | Attending: Internal Medicine | Admitting: Internal Medicine

## 2013-08-15 VITALS — BP 132/76 | HR 78 | Temp 98.7°F | Resp 16 | Wt 132.6 lb

## 2013-08-15 DIAGNOSIS — D649 Anemia, unspecified: Secondary | ICD-10-CM | POA: Insufficient documentation

## 2013-08-15 DIAGNOSIS — M545 Low back pain, unspecified: Secondary | ICD-10-CM

## 2013-08-15 DIAGNOSIS — M5430 Sciatica, unspecified side: Secondary | ICD-10-CM | POA: Insufficient documentation

## 2013-08-15 DIAGNOSIS — G8929 Other chronic pain: Secondary | ICD-10-CM | POA: Insufficient documentation

## 2013-08-15 DIAGNOSIS — K219 Gastro-esophageal reflux disease without esophagitis: Secondary | ICD-10-CM | POA: Insufficient documentation

## 2013-08-15 DIAGNOSIS — F17200 Nicotine dependence, unspecified, uncomplicated: Secondary | ICD-10-CM

## 2013-08-15 DIAGNOSIS — Z79899 Other long term (current) drug therapy: Secondary | ICD-10-CM | POA: Insufficient documentation

## 2013-08-15 DIAGNOSIS — J4489 Other specified chronic obstructive pulmonary disease: Secondary | ICD-10-CM | POA: Insufficient documentation

## 2013-08-15 DIAGNOSIS — J449 Chronic obstructive pulmonary disease, unspecified: Secondary | ICD-10-CM | POA: Insufficient documentation

## 2013-08-15 DIAGNOSIS — G43909 Migraine, unspecified, not intractable, without status migrainosus: Secondary | ICD-10-CM | POA: Insufficient documentation

## 2013-08-15 MED ORDER — GABAPENTIN 300 MG PO CAPS: 300.0000 mg | ORAL_CAPSULE | Freq: Three times a day (TID) | ORAL | Status: DC

## 2013-08-15 NOTE — Progress Notes (Signed)
Patient complains of having lower back pain Pain to her left leg Has pain to the back of her right ankle and her hands get numb
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
## 2013-08-18 ENCOUNTER — Ambulatory Visit: Payer: No Typology Code available for payment source | Admitting: Internal Medicine

## 2013-08-22 ENCOUNTER — Ambulatory Visit (HOSPITAL_COMMUNITY)
Admission: RE | Admit: 2013-08-22 | Discharge: 2013-08-22 | Disposition: A | Discharge status: Home / Self Care | Payer: No Typology Code available for payment source | Source: Ambulatory Visit | Attending: Internal Medicine | Admitting: Internal Medicine

## 2013-08-22 DIAGNOSIS — M5430 Sciatica, unspecified side: Secondary | ICD-10-CM

## 2013-08-22 DIAGNOSIS — G8929 Other chronic pain: Secondary | ICD-10-CM

## 2013-08-22 DIAGNOSIS — M79609 Pain in unspecified limb: Secondary | ICD-10-CM | POA: Insufficient documentation

## 2013-08-22 DIAGNOSIS — M545 Low back pain, unspecified: Secondary | ICD-10-CM

## 2013-08-25 ENCOUNTER — Telehealth: Payer: Self-pay

## 2013-08-25 NOTE — Telephone Encounter (Signed)
Message copied by Dorothe Pea on Thu Aug 25, 2013  4:40 PM ------      Message from: Lorayne Marek      Created: Tue Aug 23, 2013 10:03 AM       Call and let the patient know that her MRI reported  no focal disc herniation spinal stenosis or nerve root compression. ------

## 2013-08-25 NOTE — Telephone Encounter (Signed)
Patient is aware of her mri results Still having excruciating pain in her back and wants to know What she do?

## 2013-11-24 ENCOUNTER — Encounter: Payer: Self-pay | Admitting: Internal Medicine

## 2014-02-07 ENCOUNTER — Ambulatory Visit: Payer: Self-pay | Attending: Internal Medicine | Admitting: Internal Medicine

## 2014-02-07 ENCOUNTER — Encounter: Payer: Self-pay | Admitting: Internal Medicine

## 2014-02-07 VITALS — BP 130/80 | HR 83 | Temp 97.6°F | Resp 16 | Wt 135.4 lb

## 2014-02-07 DIAGNOSIS — K029 Dental caries, unspecified: Secondary | ICD-10-CM

## 2014-02-07 DIAGNOSIS — M545 Low back pain, unspecified: Secondary | ICD-10-CM

## 2014-02-07 DIAGNOSIS — Z23 Encounter for immunization: Secondary | ICD-10-CM | POA: Insufficient documentation

## 2014-02-07 DIAGNOSIS — Z720 Tobacco use: Secondary | ICD-10-CM

## 2014-02-07 DIAGNOSIS — N920 Excessive and frequent menstruation with regular cycle: Secondary | ICD-10-CM | POA: Insufficient documentation

## 2014-02-07 DIAGNOSIS — E559 Vitamin D deficiency, unspecified: Secondary | ICD-10-CM | POA: Insufficient documentation

## 2014-02-07 DIAGNOSIS — F17200 Nicotine dependence, unspecified, uncomplicated: Secondary | ICD-10-CM

## 2014-02-07 DIAGNOSIS — F17210 Nicotine dependence, cigarettes, uncomplicated: Secondary | ICD-10-CM | POA: Insufficient documentation

## 2014-02-07 DIAGNOSIS — G8929 Other chronic pain: Secondary | ICD-10-CM | POA: Insufficient documentation

## 2014-02-07 MED ORDER — FERROUS SULFATE 325 (65 FE) MG PO TABS: 325.0000 mg | ORAL_TABLET | Freq: Three times a day (TID) | ORAL | Status: DC

## 2014-02-07 MED ORDER — IBUPROFEN 800 MG PO TABS: 800.0000 mg | ORAL_TABLET | Freq: Three times a day (TID) | ORAL | Status: DC | PRN

## 2014-02-07 MED ORDER — VITAMIN D (ERGOCALCIFEROL) 1.25 MG (50000 UNIT) PO CAPS: 50000.0000 [IU] | ORAL_CAPSULE | ORAL | Status: DC

## 2014-02-07 MED ORDER — CYCLOBENZAPRINE HCL 10 MG PO TABS: 10.0000 mg | ORAL_TABLET | Freq: Three times a day (TID) | ORAL | Status: DC | PRN

## 2014-02-07 NOTE — Progress Notes (Signed)
MRN: [id number] Name: BASIA MCGINTY  Sex: female Age: 43 y.o. DOB: [birth year]  Allergies: Amoxicillin; Erythromycin; Percocet; and Vicodin  Chief Complaint  Patient presents with  . dental referral    HPI: Patient is 43 y.o. female who has history of chronic back pain, anemia comes today for followup requesting pain medication as well as she was using Flexeril which helped her with the symptoms of muscle spasm, denies any fever chills chest and shortness of breath previous blood work reviewed she has anemia and patient has not been taking her iron supplements regularly as well as history of vitamin D deficiency, patient like to get a flu shot today. Patient is also requesting referral to see a dentist since she has lot of dental cavities.  Past Medical History  Diagnosis Date  . Headache(784.0)   . Anemia   . Weight loss   . Sore throat   . Cough   . Constipation   . Weakness   . COPD (chronic obstructive pulmonary disease)   . Migraine     Past Surgical History  Procedure Laterality Date  . Tubal ligation    . Laparoscopic appendectomy  03/14/2011    Procedure: APPENDECTOMY LAPAROSCOPIC;  Surgeon: Shann Medal, MD;  Location: [facility];  Service: General;  Laterality: N/A;  . Appendectomy  03/14/11      Medication List       This list is accurate as of: 02/07/14 10:44 AM.  Always use your most recent med list.               aspirin-acetaminophen-caffeine 250-250-65 MG per tablet  Commonly known as:  EXCEDRIN MIGRAINE  Take 1-2 tablets by mouth every 8 (eight) hours as needed for headache.     benzocaine 10 % mucosal gel  Commonly known as:  ORAJEL  Use as directed 1 application in the mouth or throat as needed for mouth pain.     benzonatate 100 MG capsule  Commonly known as:  TESSALON  Take 1 capsule (100 mg total) by mouth 3 (three) times daily as needed for cough.     clindamycin 150 MG capsule  Commonly known as:  CLEOCIN  Take 2 capsules (300 mg  total) by mouth 3 (three) times daily.     cyclobenzaprine 10 MG tablet  Commonly known as:  FLEXERIL  Take 1 tablet (10 mg total) by mouth 3 (three) times daily as needed for muscle spasms.     famotidine 20 MG tablet  Commonly known as:  PEPCID  Take 1 tablet (20 mg total) by mouth 2 (two) times daily.     ferrous sulfate 325 (65 FE) MG tablet  Take 1 tablet (325 mg total) by mouth 3 (three) times daily with meals.     gabapentin 300 MG capsule  Commonly known as:  NEURONTIN  Take 1 capsule (300 mg total) by mouth 3 (three) times daily.     ibuprofen 800 MG tablet  Commonly known as:  ADVIL,MOTRIN  Take 1 tablet (800 mg total) by mouth every 8 (eight) hours as needed for headache or mild pain.     nicotine 21 mg/24hr patch  Commonly known as:  NICODERM CQ  Place 1 patch (21 mg total) onto the skin daily.     traMADol 50 MG tablet  Commonly known as:  ULTRAM  Take 1 tablet (50 mg total) by mouth every 6 (six) hours as needed.     Vitamin D (Ergocalciferol) 50000 UNITS  Caps capsule  Commonly known as:  DRISDOL  Take 1 capsule (50,000 Units total) by mouth every 7 (seven) days.        Meds ordered this encounter  Medications  . ibuprofen (ADVIL,MOTRIN) 800 MG tablet    Sig: Take 1 tablet (800 mg total) by mouth every 8 (eight) hours as needed for headache or mild pain.    Dispense:  60 tablet    Refill:  1  . Vitamin D, Ergocalciferol, (DRISDOL) 50000 UNITS CAPS capsule    Sig: Take 1 capsule (50,000 Units total) by mouth every 7 (seven) days.    Dispense:  12 capsule    Refill:  0  . ferrous sulfate 325 (65 FE) MG tablet    Sig: Take 1 tablet (325 mg total) by mouth 3 (three) times daily with meals.    Dispense:  90 tablet    Refill:  3  . cyclobenzaprine (FLEXERIL) 10 MG tablet    Sig: Take 1 tablet (10 mg total) by mouth 3 (three) times daily as needed for muscle spasms.    Dispense:  30 tablet    Refill:  2    Immunization History  Administered Date(s)  Administered  . Influenza,inj,Quad PF,36+ Mos 02/07/2014    Family History  Problem Relation Age of Onset  . Cancer Father     bone  . Cancer Sister     breast  . Heart disease Mother   . Hypertension Mother     History  Substance Use Topics  . Smoking status: Current Every Day Smoker -- 1.00 packs/day for 12 years  . Smokeless tobacco: Not on file  . Alcohol Use: No    Review of Systems   As noted in HPI  Filed Vitals:   02/07/14 1003  BP: 130/80  Pulse: 83  Temp: 97.6 F (36.4 C)  Resp: 16    Physical Exam  Physical Exam  Constitutional: No distress.  HENT:  Dental cavities  Eyes: EOM are normal. Pupils are equal, round, and reactive to light.  Cardiovascular: Normal rate and regular rhythm.   Pulmonary/Chest: Breath sounds normal. No respiratory distress. She has no wheezes. She has no rales.  Musculoskeletal:  Lower lumbar paraspinal tenderness,  equal strength both lower studies DTR 2+.     CBC    Component Value Date/Time   WBC 6.0 05/20/2013 1522   RBC 3.87 05/20/2013 1522   HGB 9.5* 05/20/2013 1522   HCT 30.5* 05/20/2013 1522   PLT 275 05/20/2013 1522   MCV 78.8 05/20/2013 1522   LYMPHSABS 1.5 05/20/2013 1522   MONOABS 0.3 05/20/2013 1522   EOSABS 0.1 05/20/2013 1522   BASOSABS 0.0 05/20/2013 1522    CMP     Component Value Date/Time   NA 141 05/20/2013 1522   K 3.5 05/20/2013 1522   CL 106 05/20/2013 1522   CO2 27 05/20/2013 1522   GLUCOSE 79 05/20/2013 1522   BUN 9 05/20/2013 1522   CREATININE 0.64 05/20/2013 1522   CREATININE 0.66 03/12/2013 1432   CALCIUM 9.2 05/20/2013 1522   PROT 7.1 05/20/2013 1522   ALBUMIN 4.1 05/20/2013 1522   AST 13 05/20/2013 1522   ALT 8 05/20/2013 1522   ALKPHOS 47 05/20/2013 1522   BILITOT 0.3 05/20/2013 1522   GFRNONAA >89 05/20/2013 1522   GFRNONAA >90 03/12/2013 1432   GFRAA >89 05/20/2013 1522   GFRAA >90 03/12/2013 1432    Lab Results  Component Value Date/Time   CHOL 161 05/20/2013  3:22 PM    No components found with this basename:  hga1c    Lab Results  Component Value Date/Time   AST 13 05/20/2013  3:22 PM    Assessment and Plan  Chronic low back pain - Plan: ibuprofen (ADVIL,MOTRIN) 800 MG tablet, cyclobenzaprine (FLEXERIL) 10 MG tablet Advised patient to apply heating pad.    Smoking And is patient to quit smoking.  Menorrhagia with regular cycle - Plan: ferrous sulfate 325 (65 FE) MG tablet, Ambulatory referral to Gynecology  Dental cavities - Plan: Ambulatory referral to Dentistry  Vitamin D deficiency - Plan: Vitamin D, Ergocalciferol, (DRISDOL) 50000 UNITS CAPS capsule, CANCELED: Ambulatory referral to Dentistry   Health Maintenance Flu shot given today.  Return in about 3 months (around 05/10/2014) for anemia.  Lorayne Marek, MD

## 2014-02-07 NOTE — Progress Notes (Signed)
Patient is here requesting a referral to the dentist Also complaining of having back spasms for the past two years Patient complaining of insomnia due to her back pain Requesting a prescription for ibuprofen 800 mg

## 2014-03-23 ENCOUNTER — Encounter: Payer: Self-pay | Admitting: Obstetrics & Gynecology

## 2014-05-08 ENCOUNTER — Encounter (HOSPITAL_COMMUNITY): Payer: Self-pay | Admitting: *Deleted

## 2014-05-08 ENCOUNTER — Emergency Department (INDEPENDENT_AMBULATORY_CARE_PROVIDER_SITE_OTHER)
Admission: EM | Admit: 2014-05-08 | Discharge: 2014-05-08 | Disposition: A | Discharge status: Home / Self Care | Payer: PRIVATE HEALTH INSURANCE | Source: Home / Self Care | Attending: Emergency Medicine | Admitting: Emergency Medicine

## 2014-05-08 DIAGNOSIS — M222X9 Patellofemoral disorders, unspecified knee: Secondary | ICD-10-CM

## 2014-05-08 DIAGNOSIS — M545 Low back pain, unspecified: Secondary | ICD-10-CM

## 2014-05-08 DIAGNOSIS — M7701 Medial epicondylitis, right elbow: Secondary | ICD-10-CM

## 2014-05-08 MED ORDER — OMEPRAZOLE 20 MG PO CPDR: 20.0000 mg | DELAYED_RELEASE_CAPSULE | Freq: Every day | ORAL | Status: DC

## 2014-05-08 MED ORDER — MELOXICAM 15 MG PO TABS: 15.0000 mg | ORAL_TABLET | Freq: Every day | ORAL | Status: DC

## 2014-05-08 NOTE — Discharge Instructions (Signed)
Wear Dr. Felicie Morn shoe inserts in both shoes.  Use an elbow strap on your right elbow.  Do exercises as below and follow up with Dr. Percell Miller.  Knee pain can be caused by many conditions:  Osteoarthritis, gout, bursitis, tendonitis, cartiledge damage, condromalacia patella, patellofemoral syndrome, and ligament sprain to name just a few.  Often some simple conservative measures can help alleviate the pain.  Do not do the following:  Avoid squatting and doing deep knee bends.  This puts too much of load on your cartiledges and tendons.  If you do a knee bend, go only half way down, flexing your knee no more than 90 degrees.  Do the following:  If you are overweight or obese, lose weight.  This makes for a lot less load on your knee joints.  If you use tobacco, quit.  Nicotine causes spasm of the small arteries, decreases blood flow, and impairs your body's normal ability to repair damage.  If your knee is acutely inflamed, use the principles of RICE (rest, ice, compression, and elevation).  Wearing a knee brace can help.  These are usually made of neoprene and can be purchased over the counter at the drug store.  Use of over the counter pain meds can be of help.  Tylenol (or acetaminophen) is the safest to use.  It often helps to take this regularly.  You can take up to 2 325 mg tablets 5 times daily, but it best to start out much lower that that, perhaps 2 325 mg tablets twice daily, then increase from there. People who are on the blood thinner warfarin have to be careful about taking high doses of Tylenol.  For people who are able to tolerate them, ibuprofen and naproxyn can also help with the pain.  You should discuss these agents with your physician before taking them.  People with chronic kidney disease, hypertension, peptic ulcer disease, and reflux can suffer adverse side effects. They should not be taken with warfarin. The maximum dosage of ibuprofen is 800 mg 3 times daily with meals.  The  maximum dosage of naprosyn is 2 and 1/2 tablets twice daily with food, but again, start out low and gradually increase the dose until adequate pain relief is achieved. Ibuprofen and naprosyn should always be taken with food.  People with cartiledge injury or osteoarthritis may find glucosamine to be helpful.  This is an over-the-counter supplement that helps nourish and repair cartiledge.  The dose is 500 mg 3 times daily or 1500 mg taken in a single dose. This can take several months to work and it doesn't always work.    For people with knee pain on just one side, use of a cane held in the hand on the same side as the knee pain takes some of the stress off the knee joint and can make a big difference in knee pain.  Wearing good shoes with adequate arch support is essential.  Regular exercise is of utmost importance.  Swimming, water aerobics, or use of an elliptical exerciser put the least stress on the knees of any exercise.  Finally doing the exercises below can be very helpful.  They tend to strengthen the muscles around the knee and provide extra support and stability.  Try to do them twice a day followed by ice for 10 minutes.       Most elbow pain is caused by tendonitis of the attachments of the muscles on either the inside (medial--golfers' elbow) or outside (lateral--tennis  elbow).  This can be managed with conservative techniques as outlined below.  If this is not helpful, corticosteroid injections, splinting, physical therapy, ultrasound and shock wave therapy, and ultimately surgery have been used.  Do not:  Lift any weight with the palm facing down.  If you must lift, lift with the palm facing up.  Do the following:  Use a tennis elbow band.  This is a small band that fits on the forearm, just below the elbow.  This should be fastened tight enough so it is snug, but not so tight as to be painful.  Apply ice.  Initially every 2 to 3 hours while awake.  Thereafter you can use  it after doing the exercises as described. Apply ice (crushed ice in a zip lock bag, frozen peas or corn, or one of the commercial blue gel ice packs that you put in the freezer).  Apply every 2 or 3 hours if possible, while awake.  If you are wearing a brace or splint, you may apply the ice right over the splint.  If you do not have a splint or brace, place a moist washcloth or towel between the ice and your skin to avoid damage to your skin.  You may leave the ice in place for 10 to 15 minutes.  Alternatively, you can do ice massage by filling a paper cup half full of water, inserting a popsicle stick and freezing it.  This gives you a chunk of ice on a stick with which you can massage the painful area.  Use of over the counter pain meds can be of help.  Tylenol (or acetaminophen) is the safest to use.  It often helps to take this regularly.  You can take up to 2 325 mg tablets 5 times daily, but it best to start out much lower that that, perhaps 2 325 mg tablets twice daily, then increase from there. People who are on the blood thinner warfarin have to be careful about taking high doses of Tylenol.  For people who are able to tolerate them, ibuprofen and naproxyn can also help with the pain.  You should discuss these agents with your physician before taking them.  People with chronic kidney disease, hypertension, peptic ulcer disease, and reflux can suffer adverse side effects. They should not be taken with warfarin. The maximum dosage of ibuprofen is 800 mg 3 times daily with meals.  The maximum dosage of naprosyn is 2 and 1/2 tablets twice daily with food, but again, start out low and gradually increase the dose until adequate pain relief is achieved. Ibuprofen and naprosyn should always be taken with food.  Do the exercises below twice daily followed by ice.    Do exercises twice daily followed by moist heat for 15 minutes.      Try to be as active as possible.  If no better in 2 weeks, follow  up with orthopedist.
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
## 2014-05-10 ENCOUNTER — Ambulatory Visit: Payer: PRIVATE HEALTH INSURANCE | Attending: Physician Assistant | Admitting: Physician Assistant

## 2014-05-10 ENCOUNTER — Ambulatory Visit (HOSPITAL_COMMUNITY)
Admission: RE | Admit: 2014-05-10 | Discharge: 2014-05-10 | Disposition: A | Discharge status: Home / Self Care | Payer: PRIVATE HEALTH INSURANCE | Source: Ambulatory Visit | Attending: Physician Assistant | Admitting: Physician Assistant

## 2014-05-10 VITALS — BP 145/89 | HR 64 | Temp 97.9°F | Resp 20

## 2014-05-10 DIAGNOSIS — Z791 Long term (current) use of non-steroidal anti-inflammatories (NSAID): Secondary | ICD-10-CM | POA: Insufficient documentation

## 2014-05-10 DIAGNOSIS — M25562 Pain in left knee: Secondary | ICD-10-CM | POA: Insufficient documentation

## 2014-05-10 DIAGNOSIS — Z79899 Other long term (current) drug therapy: Secondary | ICD-10-CM | POA: Insufficient documentation

## 2014-05-10 NOTE — Progress Notes (Signed)
Chief Complaint: Left knee pain; out of work for 2 days  Subjective: 44 yo female presents with a week of left knee pain. No swelling. Knee is "popping". Hard to bear weight. 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 Recent visit to urgent care and Mobic giving along with strengthening exercises. She wants her knee x-rayed.   ROS:  GEN: denies fever or chills, denies change in weight Skin: denies lesions or rashes HEENT: denies headache, earache, epistaxis, sore throat, or neck pain LUNGS: denies SHOB, dyspnea, PND, orthopnea CV: denies CP or palpitations ABD: denies abd pain, N or V EXT: denies muscle spasms or swelling; no pain in lower ext, no weakness NEURO: denies numbness or tingling, denies sz, stroke or TIA   Objective:  Filed Vitals:   05/10/14 1006  BP: 145/89  Pulse: 64  Temp: 97.9 F (36.6 C)  TempSrc: Oral  Resp: 20  SpO2: 99%    Physical Exam:  General: in no acute distress. HEENT: no pallor, no icterus, moist oral mucosa, no JVD, no lymphadenopathy Heart: Normal  s1 &s2  Regular rate and rhythm, without murmurs, rubs, gallops. Lungs: Clear to auscultation bilaterally. Abdomen: Soft, nontender, nondistended, positive bowel sounds. Extremities: No clubbing cyanosis or edema with positive pedal pulses. No loss of ROM. Brace available.  Neuro: Alert, awake, oriented x3, nonfocal.  Pertinent Lab Results:   Medications: Prior to Admission medications   Medication Sig Start Date End Date Taking? Authorizing Provider  aspirin-acetaminophen-caffeine (EXCEDRIN MIGRAINE) [phone] MG per tablet Take 1-2 tablets by mouth every 8 (eight) hours as needed for headache.   Yes Historical Provider, MD  meloxicam (MOBIC) 15 MG tablet Take 1 tablet (15 mg total) by mouth daily. 05/08/14  Yes Harden Mo, MD  omeprazole (PRILOSEC) 20 MG capsule Take 1 capsule (20 mg total) by mouth daily. 05/08/14  Yes Harden Mo, MD  benzocaine (ORAJEL) 10 % mucosal  gel Use as directed 1 application in the mouth or throat as needed for mouth pain.    Historical Provider, MD  benzonatate (TESSALON) 100 MG capsule Take 1 capsule (100 mg total) by mouth 3 (three) times daily as needed for cough. Patient not taking: Reported on 05/10/2014 05/20/13   Lorayne Marek, MD  clindamycin (CLEOCIN) 150 MG capsule Take 2 capsules (300 mg total) by mouth 3 (three) times daily. Patient not taking: Reported on 05/10/2014 03/12/13   Tatyana A Kirichenko, PA-C  cyclobenzaprine (FLEXERIL) 10 MG tablet Take 1 tablet (10 mg total) by mouth 3 (three) times daily as needed for muscle spasms. Patient not taking: Reported on 05/10/2014 02/07/14   Lorayne Marek, MD  famotidine (PEPCID) 20 MG tablet Take 1 tablet (20 mg total) by mouth 2 (two) times daily. Patient not taking: Reported on 05/10/2014 03/12/13   Tatyana A Kirichenko, PA-C  ferrous sulfate 325 (65 FE) MG tablet Take 1 tablet (325 mg total) by mouth 3 (three) times daily with meals. Patient not taking: Reported on 05/10/2014 02/07/14   Lorayne Marek, MD  gabapentin (NEURONTIN) 300 MG capsule Take 1 capsule (300 mg total) by mouth 3 (three) times daily. Patient not taking: Reported on 05/10/2014 08/15/13   Lorayne Marek, MD  ibuprofen (ADVIL,MOTRIN) 800 MG ablet Take 1 tablet (800 mg total) by mouth every 8 (eight) hours as needed for headache or mild pain. Patient not taking: Reported on 05/10/2014 02/07/14   Lorayne Marek, MD  nicotine (NICODERM CQ) 21 mg/24hr patch Place 1 patch (21 mg total)  onto the skin daily. Patient not taking: Reported on 05/10/2014 05/20/13   Lorayne Marek, MD  traMADol (ULTRAM) 50 MG tablet Take 1 tablet (50 mg total) by mouth every 6 (six) hours as needed. Patient not taking: Reported on 05/10/2014 03/12/13   Tatyana A Kirichenko, PA-C  Vitamin D, Ergocalciferol, (DRISDOL) 50000 UNITS CAPS capsule Take 1 capsule (50,000 Units total) by mouth every 7 (seven) days. Patient not taking: Reported on 05/10/2014  02/07/14   Lorayne Marek, MD    Assessment/Plan:  1. Left knee pain Cont supportive measures. Brace. Anti inflammatories. Elevation.  Xray of left knee Referral to PT Out of work for 3 days   The patient was given clear instructions to go to ER or return to medical center if symptoms don't improve, worsen or new problems develop. The patient verbalized understanding. The patient was told to call to get lab results if they haven't heard anything in the next week.   This note has been created with Surveyor, quantity. Any transcriptional errors are unintentional.   Zettie Pho, PA-C 05/10/2014, 12:21 PM

## 2014-05-10 NOTE — Progress Notes (Addendum)
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

## 2014-05-15 ENCOUNTER — Telehealth: Payer: Self-pay | Admitting: *Deleted

## 2014-05-15 NOTE — Telephone Encounter (Signed)
-----   Message from Brayton Caves, [place] sent at 05/10/2014  1:56 PM EST ----- Will you please let her know that her knee xray is normal? Thanks.   ----- Message -----    From: Rad Results In Interface    Sent: 05/10/2014  12:25 PM      To: Brayton Caves, PA-C

## 2014-05-15 NOTE — Telephone Encounter (Signed)
Patient notified that knee x-ray is normal. No complaints offered by patient.

## 2014-05-22 ENCOUNTER — Encounter: Payer: Self-pay | Admitting: Physical Therapy

## 2014-05-22 ENCOUNTER — Ambulatory Visit: Payer: No Typology Code available for payment source | Attending: Physician Assistant | Admitting: Physical Therapy

## 2014-05-22 DIAGNOSIS — M25562 Pain in left knee: Secondary | ICD-10-CM | POA: Insufficient documentation

## 2014-05-22 DIAGNOSIS — M25662 Stiffness of left knee, not elsewhere classified: Secondary | ICD-10-CM

## 2014-05-22 DIAGNOSIS — G8929 Other chronic pain: Secondary | ICD-10-CM

## 2014-05-22 DIAGNOSIS — R262 Difficulty in walking, not elsewhere classified: Secondary | ICD-10-CM
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
## 2014-05-30 ENCOUNTER — Ambulatory Visit: Payer: No Typology Code available for payment source | Admitting: Physical Therapy

## 2014-05-30 DIAGNOSIS — G8929 Other chronic pain: Secondary | ICD-10-CM

## 2014-05-30 DIAGNOSIS — M25662 Stiffness of left knee, not elsewhere classified: Secondary | ICD-10-CM

## 2014-05-30 DIAGNOSIS — M25562 Pain in left knee: Principal | ICD-10-CM

## 2014-05-30 DIAGNOSIS — R262 Difficulty in walking, not elsewhere classified: Secondary | ICD-10-CM

## 2014-05-30 NOTE — Therapy (Signed)
[place], [place], [address] Phone: [phone]   Fax:  [phone]  Physical Therapy Treatment  Patient Details  Name: Nicole Cisneros MRN: [id number] Date of Birth: 1970/06/25 Referring Provider:  Lorayne Marek, MD  Encounter Date: 05/30/2014      PT End of Session - 05/30/14 1027    Visit Number 2   Number of Visits 12   Date for PT Re-Evaluation 06/24/14   PT Start Time 0925   PT Stop Time 1040   PT Time Calculation (min) 75 min   Activity Tolerance Patient tolerated treatment well;Patient limited by pain      Past Medical History  Diagnosis Date  . Headache(784.0)   . Anemia   . Weight loss   . Sore throat   . Cough   . Constipation   . Weakness   . COPD (chronic obstructive pulmonary disease)   . Migraine     Past Surgical History  Procedure Laterality Date  . Tubal ligation    . Laparoscopic appendectomy  03/14/2011    Procedure: APPENDECTOMY LAPAROSCOPIC;  Surgeon: Shann Medal, MD;  Location: [facility];  Service: General;  Laterality: N/A;  . Appendectomy  03/14/11    LMP 05/06/2014  Visit Diagnosis:  Knee pain, chronic, left  Stiffness of joint, lower leg, left  Difficulty walking up stairs      Subjective Assessment - 05/30/14 0928    Symptoms Knees hurt LT>RT rt worse with putting foot down, weight bearing,  feels pulling gastroc. Muscle rub helps   Currently in Pain? Yes   Pain Score 8    Pain Location Knee   Pain Orientation Left   Pain Descriptors / Indicators --  pops, aches   Pain Type Chronic pain   Pain Radiating Towards Runs down thigh  Has intermittant back pain   Pain Onset More than a month ago   Pain Frequency Intermittent   Aggravating Factors  getting up from sitting, walking, cement steps pops with painT   Pain Relieving Factors Brace, muscle rub ice,   Effect of Pain on Daily Activities Limits time on feet, extra time needed to do work tasks.   Multiple Pain Sites Yes                    [facility] Adult PT Treatment/Exercise - 05/30/14 0951    Knee/Hip Exercises: Seated   Long Arc Quad Both;1 set  5 reps 0 LBS, added to home exercises   Knee/Hip Exercises: Supine   Quad Sets Both;1 set  5 reps, with heat, added to home exercises   Short Arc Quad Sets Both;1 set  5 reps with moist heat   Heel Slides Both  5 reps , added to home exercise   Heel Slides Limitations 108 degrees RT, 118   Straight Leg Raises Both;1 set  5 reps,  hurt back supine, so did these standing.   Modalities   Modalities Cryotherapy;Moist Heat;Electrical Stimulation   Moist Heat Therapy   Number Minutes Moist Heat 25 Minutes   Moist Heat Location --  knees, during exercises to increase flexibility and decrease   Cryotherapy   Number Minutes Cryotherapy 15 Minutes   Cryotherapy Location Knee  Both   Type of Cryotherapy --  cold pack   Electrical Stimulation   Electrical Stimulation Location Knees   Electrical Stimulation Action IFC   Electrical Stimulation Goals Pain  PT Education - 05/30/14 0950    Education provided Yes   Education Details Basic knee, use of heat an cold   Person(s) Educated Patient   Methods Explanation;Demonstration;Handout   Comprehension Verbalized understanding;Returned demonstration             PT Long Term Goals - 05/30/14 1031    PT LONG TERM GOAL #1   Title Pt will be able to walk with brace for up to 20-30 min with pain tolerable.    Time 6   Period Weeks   Status Unable to assess   PT LONG TERM GOAL #2   Title Pt will be able to negotiate stairs with min pain, UE support   Time 6   Status On-going   PT LONG TERM GOAL #3   Title Pt will be able to improve antalgic gait following PT session   Time 6   Period Weeks   Status On-going   PT LONG TERM GOAL #4   Title Pt. will demo hip strength to 4/5 or greater for improved gait stability and work activities   Time 6    Period Weeks   Status On-going               Plan - 05/30/14 1028    Clinical Impression Statement Beginner knee exercises and modalities, self care for exercise and heat and cold use   PT Treatment/Interventions Therapeutic activities;Patient/family education;Passive range of motion;Therapeutic exercise;DME Instruction;Gait training;Ultrasound;Balance training;Manual techniques;Neuromuscular re-education;Stair training;Cryotherapy;Electrical Stimulation;Functional mobility training   PT Next Visit Plan review level 1 exercise    Consulted and Agree with Plan of Care Patient        Problem List Patient Active Problem List   Diagnosis Date Noted  . GERD (gastroesophageal reflux disease) 03/14/2013  . Anemia 03/14/2013  . Smoking 03/14/2013  . Appendicitis 03/14/2011   Melvenia Needles, PTA 05/30/2014 10:33 AM Phone: [phone] Fax: [phone]  [facility] 05/30/2014, 10:33 AM  [facility] [address] [place], [place], [address] Phone: [phone]   Fax:  [phone]

## 2014-05-30 NOTE — Patient Instructions (Signed)
   Copyright  VHI. All rights reserved.  HIP: Flexion / KNEE: Extension, Straight Leg Raise   Raise leg, keeping knee straight. Perform slowly. ___ reps per set, ___ sets per day, ___ days per week   Copyright  VHI. All rights reserved.  Heel Slide   Bend knee and pull heel toward buttocks. Hold ____ seconds. Return. Repeat with other knee. Repeat ____ times. Do ____ sessions per day.  http://gt2.exer.us/372   Copyright  VHI. All rights reserved.     Raise leg until knee is straight. ___ reps per set, ___ sets per day, ___ days per week  Copyright  VHI. All rights reserved.  Short Arc [employer] a large can or rolled towel under leg. Straighten knee and leg. Hold ____ seconds. Repeat with other leg. Repeat ____ times. Do ____ sessions per day.  http://gt2.exer.us/365   Copyright  VHI. All rights reserved.  Quad Set   Slowly tighten muscles on thigh of straight leg while counting out loud to ____. Repeat with other leg. Repeat ____ times. Do ____ sessions per day.  http://[email or web address].exer.us/361   Copyright  VHI. All rights reserved.

## 2014-06-06 ENCOUNTER — Ambulatory Visit: Payer: No Typology Code available for payment source | Admitting: Physical Therapy

## 2014-06-06 DIAGNOSIS — G8929 Other chronic pain: Secondary | ICD-10-CM

## 2014-06-06 DIAGNOSIS — M25562 Pain in left knee: Principal | ICD-10-CM

## 2014-06-06 DIAGNOSIS — M25662 Stiffness of left knee, not elsewhere classified: Secondary | ICD-10-CM

## 2014-06-06 DIAGNOSIS — R262 Difficulty in walking, not elsewhere classified: Secondary | ICD-10-CM

## 2014-06-06 NOTE — Therapy (Signed)
[place], [place], [address] Phone: [phone]   Fax:  [phone]  Physical Therapy Treatment  Patient Details  Name: Nicole Cisneros MRN: [id number] Date of Birth: [birth year] Referring Provider:  Lorayne Marek, MD  Encounter Date: 06/06/2014      PT End of Session - 06/06/14 1115    Visit Number 3   Number of Visits 12   Date for PT Re-Evaluation 06/24/14   PT Start Time 1102   PT Stop Time 1155   PT Time Calculation (min) 53 min   Activity Tolerance Patient limited by pain      Past Medical History  Diagnosis Date  . Headache(784.0)   . Anemia   . Weight loss   . Sore throat   . Cough   . Constipation   . Weakness   . COPD (chronic obstructive pulmonary disease)   . Migraine     Past Surgical History  Procedure Laterality Date  . Tubal ligation    . Laparoscopic appendectomy  03/14/2011    Procedure: APPENDECTOMY LAPAROSCOPIC;  Surgeon: Shann Medal, MD;  Location: [facility];  Service: General;  Laterality: N/A;  . Appendectomy  03/14/11    LMP 05/06/2014  Visit Diagnosis:  Knee pain, chronic, left  Stiffness of joint, lower leg, left  Difficulty walking up stairs      Subjective Assessment - 06/06/14 1104    Symptoms L knee pain today, same as previous rated 7/10. Medial aspect. Reports swelling. Wears brace at work (housekeeping).    Currently in Pain? Yes   Pain Score 7    Pain Location Knee   Pain Orientation Left   Pain Type Chronic pain   Pain Onset More than a month ago   Pain Frequency Intermittent          [facility] PT Assessment - 06/06/14 1127    Posture/Postural Control   Posture Comments Lt. ASIS higher than Rt. in supine but no LL discrepancy           [facility] Adult PT Treatment/Exercise - 06/06/14 1127    Knee/Hip Exercises: Supine   Quad Sets Left;Both;1 set;10 reps   Short Arc Quad Sets Strengthening;Both;1 set;20 reps   Heel Slides  Strengthening;Left;1 set   Hip Adduction Isometric Both;1 set;10 reps   Bridges 1 set;10 reps   Bridges Limitations used ball, needed rest break   Straight Leg Raises Strengthening;Both;1 set;10 reps   Straight Leg Raise with External Rotation Strengthening;Left;1 set;10 reps   Modalities   Modalities Cryotherapy   Cryotherapy   Number Minutes Cryotherapy 15 Minutes   Cryotherapy Location Knee  Both   Ultrasound   Ultrasound Location medial knee L   Ultrasound Parameters 1 MHz, 1.2 W/cm2 and 50%   Ultrasound Goals Pain                PT Education - 06/06/14 1131    Education provided Yes   Education Details bridging, core   Person(s) Educated Patient   Methods Explanation;Demonstration   Comprehension Verbalized understanding;Returned demonstration             PT Long Term Goals - 05/30/14 1031    PT LONG TERM GOAL #1   Title Pt will be able to walk with brace for up to 20-30 min with pain tolerable.    Time 6   Period Weeks   Status Unable to assess   PT LONG TERM GOAL #2   Title Pt  will be able to negotiate stairs with min pain, UE support   Time 6   Status On-going   PT LONG TERM GOAL #3   Title Pt will be able to improve antalgic gait following PT session   Time 6   Period Weeks   Status On-going   PT LONG TERM GOAL #4   Title Pt. will demo hip strength to 4/5 or greater for improved gait stability and work activities   Time 6   Period Weeks   Status On-going               Plan - 06/06/14 1122    Clinical Impression Statement Patient with limitations in functional mobility due to LLE pain and low back pain.  She was recommended to see an orthopedic and/or consult with Sports med. Pain is medial joint line, severe at times.  She is very limited at work and is considering a job change.  Can do HEP despite pain.    PT Next Visit Plan cont to progress knee and hip   PT Home Exercise Plan cont with level 1 ex   Consulted and Agree with Plan of  Care Patient        Problem List Patient Active Problem List   Diagnosis Date Noted  . GERD (gastroesophageal reflux disease) 03/14/2013  . Anemia 03/14/2013  . Smoking 03/14/2013  . Appendicitis 03/14/2011    PAA,JENNIFER 06/06/2014, 12:04 PM  [facility] [address] [place], [place], [address] Phone: [phone]   Fax:  [phone]

## 2014-06-08 ENCOUNTER — Ambulatory Visit: Payer: No Typology Code available for payment source | Admitting: Physical Therapy

## 2014-06-08 DIAGNOSIS — R262 Difficulty in walking, not elsewhere classified: Secondary | ICD-10-CM

## 2014-06-08 DIAGNOSIS — G8929 Other chronic pain: Secondary | ICD-10-CM

## 2014-06-08 DIAGNOSIS — M25562 Pain in left knee: Principal | ICD-10-CM

## 2014-06-08 DIAGNOSIS — M25662 Stiffness of left knee, not elsewhere classified: Secondary | ICD-10-CM

## 2014-06-08 NOTE — Patient Instructions (Signed)
Reducing Load   Copyright  VHI. All rights reserved.  BODY MECHANICS Tips Good body mechanics are important during activities of daily living. The practice of good body mechanics will: -help distribute weight throughout the skeleton in a more anatomically correct manner thus stimulating more normal forces on the bones, and encouraging stronger, healthier, denser bones. -reduce unnatural forces on bones, ligaments, joints and muscles and reduce risk of fracture, other injury or back pain. A WORD ON BODY POSITIONING: Sitting is the hardest position for the back. Lying on the back is the easiest. Standing, in good body alignment, is somewhere between. A good motto is: Sit less, stand more, and, when you can't do that, lie down on your back and exercise to strengthen it.  Copyright  VHI. All rights reserved.       Supine to Sit (Active)     Copyright  VHI. All rights reserved.    Housework - Reaching Down   If you are unable to bend your knees or squat, use a lazy Nicole Cisneros to keep items within easy reach. Store only light, unbreakable items on the lowest shelves, and use a reacher to pick them up.  Copyright  VHI. All rights reserved.  Low Shelf   Squat down, and bring item close to lift.   Copyright  VHI. All rights reserved.  Lifting Principles .Maintain proper posture and head alignment. .Slide object as close as possible before lifting. .Move obstacles out of the way. .Test before lifting; ask for help if too heavy. .Tighten stomach muscles without holding breath. .Use smooth movements; do not jerk. .Use legs to do the work, and pivot with feet. .Distribute the work load symmetrically and close to the center of trunk. .Push instead of pull whenever possible.  Copyright  VHI. All rights reserved.  Posture - Standing   Good posture is important. Avoid slouching and forward head thrust. Maintain curve in low back and align ears over shoul- ders, hips over  ankles.   Copyright  VHI. All rights reserved.   Posture - Sitting   Sit upright, head facing forward. Try using a roll to support lower back. Keep shoulders relaxed, and avoid rounded back. Keep hips level with knees. Avoid crossing legs for long periods.   Copyright  VHI. All rights reserved.  Ideal Posture Use with figures on 3 (2 of 2): 1.Head erect 2.Chin in 3.Chest and navel aligned 4.Spinal curves maintained 5.Knees relaxed 6.Shoulders and hips aligned 7.Feet slightly apart 8.Toes and arches active 9.Abdomen taut (breathe with diaphragm) 10.Arms at sides Ideal posture is: -pain free. -achieved with practice, mindful interest, and body awareness.  Copyright  VHI. All rights reserved.     Move heavy items one at a time, or move portions of the contents.   Posture Awareness     Stand and check posture: Jut chin, pull back to comfortable position. Tilt pelvis forward, back; be sure back is not swayed. Roll from heels to balls of feet, then distribute your weight evenly. Picture a line through spine pulling you erect. Focus on breathing. Good Posture = Better Breathing. Check __3__ times per day.  http://gt2.exer.us/873   Copyright  VHI. All rights reserved.

## 2014-06-08 NOTE — Therapy (Signed)
[place], [place], [address] Phone: [phone]   Fax:  [phone]  Physical Therapy Treatment  Patient Details  Name: Nicole Cisneros MRN: [id number] Date of Birth: June 26, 1970 Referring Provider:  Lorayne Marek, MD  Encounter Date: 06/08/2014      PT End of Session - 06/08/14 1210    Visit Number 4   Number of Visits 12   Date for PT Re-Evaluation 06/24/14   PT Start Time 1146   Activity Tolerance Patient tolerated treatment well      Past Medical History  Diagnosis Date  . Headache(784.0)   . Anemia   . Weight loss   . Sore throat   . Cough   . Constipation   . Weakness   . COPD (chronic obstructive pulmonary disease)   . Migraine     Past Surgical History  Procedure Laterality Date  . Tubal ligation    . Laparoscopic appendectomy  03/14/2011    Procedure: APPENDECTOMY LAPAROSCOPIC;  Surgeon: Shann Medal, MD;  Location: [place];  Service: General;  Laterality: N/A;  . Appendectomy  03/14/11    There were no vitals taken for this visit.  Visit Diagnosis:  No diagnosis found.      Subjective Assessment - 06/08/14 1148    Symptoms Not as much pain today, 6/10.    Currently in Pain? Yes   Pain Score 6                     [facility] Adult PT Treatment/Exercise - 06/08/14 1158    Knee/Hip Exercises: Stretches   Gastroc Stretch 5 reps;30 seconds   Gastroc Stretch Limitations on step and against wall    Soleus Stretch 2 reps;30 seconds   Knee/Hip Exercises: Aerobic   Stationary Bike Nustep level 3 5 min UE and LE    Knee/Hip Exercises: Standing   Lateral Step Up Left;1 set;20 reps;Hand Hold: 2   Forward Step Up Left;1 set;20 reps;Hand Hold: 2   SLS with Vectors hip abd on LLE stance x 10   Knee/Hip Exercises: Seated   Long Arc Quad Left;1 set;20 reps;Weights   Long Arc Quad Weight 3 lbs.   Long [employer] Limitations with ball squeeze   Heel Slides AAROM;Left;1 set;10  reps   Knee/Hip Exercises: Supine   Quad Sets Left;Both;1 set;10 reps   Short Arc Quad Sets Strengthening;Both;1 set;20 reps   Bridges 1 set;10 reps   Bridges Limitations used ball, needed rest break   Straight Leg Raises Strengthening;Both;1 set;10 reps   Straight Leg Raise with External Rotation Strengthening;Left;1 set;10 reps                PT Education - 06/08/14 1210    Education provided No             PT Long Term Goals - 05/30/14 1031    PT LONG TERM GOAL #1   Title Pt will be able to walk with brace for up to 20-30 min with pain tolerable.    Time 6   Period Weeks   Status Unable to assess   PT LONG TERM GOAL #2   Title Pt will be able to negotiate stairs with min pain, UE support   Time 6   Status On-going   PT LONG TERM GOAL #3   Title Pt will be able to improve antalgic gait following PT session   Time 6   Period Weeks   Status  On-going   PT LONG TERM GOAL #4   Title Pt. will demo hip strength to 4/5 or greater for improved gait stability and work activities   Time 6   Period Weeks   Status On-going               Plan - 06/08/14 1211    Clinical Impression Statement Patient progressing towards goals, uses ice at home and appears to be doing her HEP.  Standing for a short period today caused fatigue in low back, hip.  Responsive to handout of body mechanics for work.    PT Next Visit Plan cont to progress knee and hip, include more standing.    PT Home Exercise Plan progress next week   Consulted and Agree with Plan of Care Patient        Problem List Patient Active Problem List   Diagnosis Date Noted  . GERD (gastroesophageal reflux disease) 03/14/2013  . Anemia 03/14/2013  . Smoking 03/14/2013  . Appendicitis 03/14/2011    PAA,JENNIFER 06/08/2014, 12:43 PM  [facility] [address] [place], [place], [address] Phone: [phone]   Fax:  [phone]

## 2014-06-13 ENCOUNTER — Ambulatory Visit: Payer: No Typology Code available for payment source | Attending: Physician Assistant | Admitting: Physical Therapy

## 2014-06-13 DIAGNOSIS — M25562 Pain in left knee: Secondary | ICD-10-CM | POA: Insufficient documentation

## 2014-06-13 DIAGNOSIS — M25662 Stiffness of left knee, not elsewhere classified: Secondary | ICD-10-CM | POA: Insufficient documentation

## 2014-06-13 DIAGNOSIS — G8929 Other chronic pain: Secondary | ICD-10-CM | POA: Insufficient documentation

## 2014-06-13 DIAGNOSIS — R262 Difficulty in walking, not elsewhere classified: Secondary | ICD-10-CM

## 2014-06-13 NOTE — Therapy (Signed)
[place], [place], [address] Phone: [phone]   Fax:  [phone]  Physical Therapy Treatment  Patient Details  Name: Nicole Cisneros MRN: [id number] Date of Birth: 07-16-1970 Referring Provider:  Lorayne Marek, MD  Encounter Date: 06/13/2014      PT End of Session - 06/13/14 1245    Visit Number 5   Number of Visits 12   Date for PT Re-Evaluation 06/24/14   PT Start Time 1146   PT Stop Time 5170   PT Time Calculation (min) 49 min   Activity Tolerance Patient limited by pain;Patient tolerated treatment well      Past Medical History  Diagnosis Date  . Headache(784.0)   . Anemia   . Weight loss   . Sore throat   . Cough   . Constipation   . Weakness   . COPD (chronic obstructive pulmonary disease)   . Migraine     Past Surgical History  Procedure Laterality Date  . Tubal ligation    . Laparoscopic appendectomy  03/14/2011    Procedure: APPENDECTOMY LAPAROSCOPIC;  Surgeon: Shann Medal, MD;  Location: [place];  Service: General;  Laterality: N/A;  . Appendectomy  03/14/11    There were no vitals taken for this visit.  Visit Diagnosis:  Knee pain, chronic, left  Difficulty walking up stairs      Subjective Assessment - 06/13/14 1146    Symptoms Back 9/10.   shooting from shoulder Rt down back, flared after work Sat,  Better biofreeze.  Knee better with tylenol 6-7/10.  Wearing brace helpful.  Knee swells with being on it all day a t work.   Tries to use good posture at work.  Worse with moving real quick.   Currently in Pain? --  see above                    [facility] Adult PT Treatment/Exercise - 06/13/14 1154    Knee/Hip Exercises: Aerobic   Stationary Bike Nustep   Knee/Hip Exercises: Standing   Heel Raises 10 reps   Forward Step Up --  Rt 10reps, Lt 5 reps  8/10 Lt  hyperextends knee.  Verl Blalock Squat --  felt like knee locked up, ball yellow at back,  small moveme    Knee/Hip Exercises: Supine   Quad Sets --  Lt. poor contraction, painfu., small towel roll helpful.   Ultrasound   Ultrasound Location medial knee   Ultrasound Parameters 1Mhz, 50%   Ultrasound Goals Pain                PT Education - 06/13/14 1213    Education provided Yes   Education Details terminal knee, green band issued from drawer   Person(s) Educated Patient   Methods Explanation;Demonstration;Handout   Comprehension Verbalized understanding;Returned demonstration             PT Long Term Goals - 06/13/14 1256    PT LONG TERM GOAL #1   Title Pt will be able to walk with brace for up to 20-30 min with pain tolerable.    Time 6   Period Weeks   Status On-going   PT LONG TERM GOAL #2   Title Pt will be able to negotiate stairs with min pain, UE support   Time 6   Status On-going   PT LONG TERM GOAL #3   Title Pt will be able to improve antalgic gait following PT  session   Time 6   Period Weeks   Status On-going   PT LONG TERM GOAL #4   Title Pt. will demo hip strength to 4/5 or greater for improved gait stability and work activities   Time 6   Period Weeks   Status On-going               Plan - 06/13/14 1246    Clinical Impression Statement Has an appointment with sports Medicine.  Has pain intermittantly during exercises,  able to make progress toward home exercise goal.   PT Next Visit Plan cont to progress knee and hip, include more standing.   Notes to Sports MED.   Consulted and Agree with Plan of Care Patient        Problem List Patient Active Problem List   Diagnosis Date Noted  . GERD (gastroesophageal reflux disease) 03/14/2013  . Anemia 03/14/2013  . Smoking 03/14/2013  . Appendicitis 03/14/2011   Melvenia Needles, PTA 06/13/2014 12:58 PM Phone: [phone] Fax: [phone]   [facility] 06/13/2014, 12:58 PM  [facility] [address] [place], [place],  [address] Phone: [phone]   Fax:  [phone]

## 2014-06-14 ENCOUNTER — Other Ambulatory Visit: Payer: Self-pay | Admitting: Family Medicine

## 2014-06-14 DIAGNOSIS — K219 Gastro-esophageal reflux disease without esophagitis: Secondary | ICD-10-CM

## 2014-06-14 MED ORDER — OMEPRAZOLE 20 MG PO CPDR: 20.0000 mg | DELAYED_RELEASE_CAPSULE | Freq: Every day | ORAL | Status: DC

## 2014-06-16 ENCOUNTER — Ambulatory Visit: Payer: No Typology Code available for payment source | Admitting: Physical Therapy

## 2014-06-16 DIAGNOSIS — M25562 Pain in left knee: Principal | ICD-10-CM

## 2014-06-16 DIAGNOSIS — R262 Difficulty in walking, not elsewhere classified: Secondary | ICD-10-CM

## 2014-06-16 DIAGNOSIS — M25662 Stiffness of left knee, not elsewhere classified: Secondary | ICD-10-CM

## 2014-06-16 DIAGNOSIS — G8929 Other chronic pain: Secondary | ICD-10-CM

## 2014-06-16 NOTE — Therapy (Signed)
[place], [place], [address] Phone: [phone]   Fax:  [phone]  Physical Therapy Treatment  Patient Details  Name: SHANTINIQUE PICAZO MRN: [id number] Date of Birth: [birth year] Referring Provider:  Lorayne Marek, MD  Encounter Date: 06/16/2014      PT End of Session - 06/16/14 1014    Visit Number 6   Number of Visits 12   Date for PT Re-Evaluation 06/24/14   PT Start Time 5176   PT Stop Time 1607   PT Time Calculation (min) 45 min   Activity Tolerance Patient limited by pain;Patient tolerated treatment well      Past Medical History  Diagnosis Date  . Headache(784.0)   . Anemia   . Weight loss   . Sore throat   . Cough   . Constipation   . Weakness   . COPD (chronic obstructive pulmonary disease)   . Migraine     Past Surgical History  Procedure Laterality Date  . Tubal ligation    . Laparoscopic appendectomy  03/14/2011    Procedure: APPENDECTOMY LAPAROSCOPIC;  Surgeon: Shann Medal, MD;  Location: [place];  Service: General;  Laterality: N/A;  . Appendectomy  03/14/11    There were no vitals taken for this visit.  Visit Diagnosis:  Knee pain, chronic, left  Difficulty walking up stairs  Stiffness of joint, lower leg, left      Subjective Assessment - 06/16/14 1002    Symptoms Knee feels about the same, back is better. Pt having diff at work, her supervisor scheduled her on therapy days.    Currently in Pain? Yes   Pain Score 6                     [facility] Adult PT Treatment/Exercise - 06/16/14 1006    Knee/Hip Exercises: Standing   Terminal Knee Extension Strengthening;1 set   Theraband Level (Terminal Knee Extension) --  ball into wall   Wall Squat 2 sets;10 reps   SLS with Vectors hip abd 2 sets x 10 mod VCs   Other Standing Knee Exercises gastroc stretch   Other Standing Knee Exercises towel slides   unable to maintain L flat foot with sm squat for lat.  arcs    Manual Therapy   Manual Therapy Massage   Massage post L knee, medial hamstring and gastoc, applied bifreeze post   Knee/Hip Exercises: Machines for Strengthening   Total Gym Leg Press 1 plate x 15 double and 1 x 1 plate LLE x 10                PT Education - 06/16/14 1046    Education provided Yes   Education Details hip stability   Person(s) Educated Patient   Methods Explanation;Demonstration   Comprehension Verbalized understanding;Returned demonstration             PT Long Term Goals - 06/13/14 1256    PT LONG TERM GOAL #1   Title Pt will be able to walk with brace for up to 20-30 min with pain tolerable.    Time 6   Period Weeks   Status On-going   PT LONG TERM GOAL #2   Title Pt will be able to negotiate stairs with min pain, UE support   Time 6   Status On-going   PT LONG TERM GOAL #3   Title Pt will be able to improve antalgic gait following PT session  Time 6   Period Weeks   Status On-going   PT LONG TERM GOAL #4   Title Pt. will demo hip strength to 4/5 or greater for improved gait stability and work activities   Time 6   Period Weeks   Status On-going               Plan - 06/16/14 1014    Clinical Impression Statement Tolerates today's session well, pain moderate. Pain focused more post-medial today   PT Next Visit Plan see what MD said, work standing hip/knee, stretch post knee. SEND MD NOTE   PT Home Exercise Plan progress next week   Consulted and Agree with Plan of Care --        Problem List Patient Active Problem List   Diagnosis Date Noted  . GERD (gastroesophageal reflux disease) 03/14/2013  . Anemia 03/14/2013  . Smoking 03/14/2013  . Appendicitis 03/14/2011    Evalise Abruzzese 06/16/2014, 10:52 AM  [facility] [address] [place], [place], [address] Phone: [phone]   Fax:  [phone]

## 2014-06-20 ENCOUNTER — Ambulatory Visit: Payer: No Typology Code available for payment source | Admitting: Physical Therapy

## 2014-06-20 DIAGNOSIS — M25562 Pain in left knee: Secondary | ICD-10-CM

## 2014-06-20 DIAGNOSIS — G8929 Other chronic pain: Secondary | ICD-10-CM

## 2014-06-20 DIAGNOSIS — M25662 Stiffness of left knee, not elsewhere classified: Secondary | ICD-10-CM

## 2014-06-21 ENCOUNTER — Ambulatory Visit (INDEPENDENT_AMBULATORY_CARE_PROVIDER_SITE_OTHER): Payer: No Typology Code available for payment source | Admitting: Sports Medicine

## 2014-06-21 ENCOUNTER — Encounter: Payer: Self-pay | Admitting: Sports Medicine

## 2014-06-21 ENCOUNTER — Ambulatory Visit: Payer: No Typology Code available for payment source | Admitting: Internal Medicine

## 2014-06-21 VITALS — BP 144/86 | Ht 63.0 in | Wt 130.0 lb

## 2014-06-21 DIAGNOSIS — M546 Pain in thoracic spine: Secondary | ICD-10-CM

## 2014-06-21 DIAGNOSIS — M545 Low back pain, unspecified: Secondary | ICD-10-CM

## 2014-06-21 DIAGNOSIS — M25562 Pain in left knee: Secondary | ICD-10-CM

## 2014-06-21 MED ORDER — METHYLPREDNISOLONE ACETATE 40 MG/ML IJ SUSP
40.0000 mg | Freq: Once | INTRAMUSCULAR | Status: AC
  Administered 2014-06-21: 40 mg via INTRA_ARTICULAR

## 2014-06-21 NOTE — Therapy (Signed)
[place], [place], [address] Phone: [phone]   Fax:  [phone]  Physical Therapy Treatment  Patient Details  Name: Nicole Cisneros MRN: [id number] Date of Birth: [birth year] Referring Provider:  Lorayne Marek, MD  Encounter Date: 06/20/2014      PT End of Session - 06/21/14 1757    Visit Number 7   Number of Visits 12   Date for PT Re-Evaluation 06/24/14   PT Start Time 1100   PT Stop Time 1155   PT Time Calculation (min) 55 min   Activity Tolerance Patient limited by pain      Past Medical History  Diagnosis Date  . Headache(784.0)   . Anemia   . Weight loss   . Sore throat   . Cough   . Constipation   . Weakness   . COPD (chronic obstructive pulmonary disease)   . Migraine     Past Surgical History  Procedure Laterality Date  . Tubal ligation    . Laparoscopic appendectomy  03/14/2011    Procedure: APPENDECTOMY LAPAROSCOPIC;  Surgeon: Shann Medal, MD;  Location: [place];  Service: General;  Laterality: N/A;  . Appendectomy  03/14/11    There were no vitals taken for this visit.  Visit Diagnosis:  Stiffness of joint, lower leg, left  Knee pain, chronic, left      Subjective Assessment - 06/20/14 1106    Symptoms 5-6/ 10 Better with mineral Ice,  Rt calf, Lt posterior knee  Feels stiff like I stretched too much.  Bears more weight through RT side  Doing her home exercises.                    [facility] Adult PT Treatment/Exercise - 06/20/14 1119    Knee/Hip Exercises: Stretches   Passive Hamstring Stretch 3 reps;30 seconds   Quad Stretch 3 reps;30 seconds  With patella glides distally   Gastroc Stretch 3 reps;30 seconds  Lt   Knee/Hip Exercises: Supine   Quad Sets 10 reps  too painful to do without small towel roll.  Poor quad contr   Cryotherapy   Number Minutes Cryotherapy 15 Minutes   Cryotherapy Location --  knee   Type of Cryotherapy --  cold pack   Electrical Stimulation   Electrical Stimulation Location knee   Electrical Stimulation Action IFC   Electrical Stimulation Parameters to tolerance   Electrical Stimulation Goals Pain                     PT Long Term Goals - 06/20/14 1111    PT LONG TERM GOAL #1   Title Pt will be able to walk with brace for up to 20-30 min with pain tolerable.    Baseline Pain with walking 5-7/10   Time 6   Period Weeks   Status On-going   PT LONG TERM GOAL #2   Title Pt will be able to negotiate stairs with min pain, UE support   Baseline 6/10  12 steps   Time 6   Period Weeks   Status On-going   PT LONG TERM GOAL #3   Title Pt will be able to improve antalgic gait following PT session   Baseline Limps less after PT    Time 6   Period Weeks   Status On-going   PT LONG TERM GOAL #4   Title Pt. will demo hip strength to 4/5 or greater for improved gait stability  and work activities   Baseline Adductor4-/5, Extension, Abduction, Flexion 4/5   Time 6   Period Weeks   Status Partially Met               Plan - 06/21/14 1759    Clinical Impression Statement Sees MD tomorrow.  Pain limits exercise.  Modalities helpful.  ROM excellent   PT Next Visit Plan see what MD said, work standing hip/knee, stretch post knee. SEND MD NOTE   Consulted and Agree with Plan of Care Patient        Problem List Patient Active Problem List   Diagnosis Date Noted  . GERD (gastroesophageal reflux disease) 03/14/2013  . Anemia 03/14/2013  . Smoking 03/14/2013  . Appendicitis 03/14/2011  Melvenia Needles, PTA 06/21/2014 6:01 PM Phone: [phone] Fax: [phone]   Melvenia Needles 06/21/2014, 6:01 PM  [facility] [address] [place], [place], [address] Phone: [phone]   Fax:  [phone]

## 2014-06-21 NOTE — Progress Notes (Signed)
  Nicole Cisneros - 44 y.o. female MRN [id number]  Date of birth: July 28, 1970  SUBJECTIVE: CC: Left knee pain, initial evaluation HPI: Patient presents with multiple orthopedic complaints including bilateral neck and shoulder pain as well as bilateral knee pain, left greater than right. Right is only minimal.  She reports pain over the anterior lateral aspect of her knee. She has occasional clicking and popping however no locking or giving way.   working with physical therapy for her bilateral knee pain  Not taking any medication on a regular basis due to reflux issues but has been prescribed meloxicam and Advil. On gabapentin and Flexeril. Prescription for Ultram  Works as a housekeeper/maid.  ROS: Denies any fevers, chills, recent weight gain or weight loss.  HISTORY:  Past Medical, Surgical, Social, and Family History reviewed & updated per EMR.  Pertinent Historical Findings include:  reports that she has been smoking.  She does not have any smokeless tobacco history on file. Family history negative for rheumatologic conditions. GERD, Current everyday smoker, interested in cutting back.  Historical Data Reviewed: MRI lumbar spine 08/22/2013: Congenitally short pedicles with minimal spondylosis. 4 view x-ray left knee: 05/10/2014: Nonweightbearing: No acute fracture dislocation. Some joint line sclerosis without evidence of significant joint space loss.   OBJECTIVE:  VS:   HT:5\' 3"  (160 cm)   WT:130 lb (58.968 kg)  BMI:23.1          BP:(!) 144/86 mmHg  HR: bpm  TEMP: ( )  RESP:   PHYSICAL EXAM:  GENERAL: Adult, thin, African-American female. No acute distress PSYCH: Alert and appropriately interactive. SKIN: No open skin lesions or abnormal skin markings on areas inspected as below VASCULAR: DP and PT pulses 2+/4. No pretibial edema. NEURO: Bilateral negative straight leg raise. Sensation grossly intact. LEFT KNEE: Overall well aligned, poor muscular development. No  significant effusion. Positive J sign. Negative patellar grind. Negative patellar apprehension. No medial or lateral joint line TTP. Stable to varus and valgus strain, anterior posterior drawer. Negative McMurray's. Small palpable soft tissue plica on medial aspect. BACK: No midline tenderness, bilateral SI joint tenderness.  ASSESSMENT: 1. Left knee pain   2. Bilateral low back pain without sciatica     Problem  Left Knee Pain   likely coming from patellofemoral pain syndrome versus medial plica  PROCEDURE NOTE : Left Knee Injection After discussing the risks, benefits and expected outcomes of the injection and all questions were reviewed and answered,  she wished to undergo the above named procedure.  Written consent was obtained. After an appropriate time out was taken the Left Knee was sterilely prepped and injected as below: Prep:    Betadine and alcohol,  Ethel chloride.  Approach:  Bent knee anterior medial Needle:  25g 1.5inch Meds:   3cc 1% lidocaine and 1cc 40mg  depomedrol A bandaid was applied to the area. This procedure was well tolerated and there were no complications.    PLAN: See problem based charting & AVS for additional documentation.  Injection today.  Continue with physical therapy & have her work with basic core strengthening  HEP: Quad sets Rx'd today > Return in about 6 weeks (around 08/02/2014), or if symptoms worsen or fail to improve.

## 2014-06-21 NOTE — Patient Instructions (Signed)
Arnica Gel has been shown to be helpful for pain relief  Keep working on the exercises I provided you and keep working with Physical Therapy

## 2014-06-23 ENCOUNTER — Ambulatory Visit: Payer: No Typology Code available for payment source | Admitting: Physical Therapy

## 2014-06-23 DIAGNOSIS — R262 Difficulty in walking, not elsewhere classified: Secondary | ICD-10-CM

## 2014-06-23 DIAGNOSIS — G8929 Other chronic pain: Secondary | ICD-10-CM

## 2014-06-23 DIAGNOSIS — M25562 Pain in left knee: Secondary | ICD-10-CM

## 2014-06-23 DIAGNOSIS — M25662 Stiffness of left knee, not elsewhere classified: Secondary | ICD-10-CM

## 2014-06-23 NOTE — Therapy (Signed)
[place], [place], [address] Phone: [phone]   Fax:  [phone]  Physical Therapy Treatment  Patient Details  Name: Nicole Cisneros MRN: [id number] Date of Birth: 01-15-1971 Referring Provider:  Lorayne Marek, MD  Encounter Date: 06/23/2014      PT End of Session - 06/23/14 1107    Visit Number 8   Number of Visits 12   Date for PT Re-Evaluation 06/24/14      Past Medical History  Diagnosis Date  . Headache(784.0)   . Anemia   . Weight loss   . Sore throat   . Cough   . Constipation   . Weakness   . COPD (chronic obstructive pulmonary disease)   . Migraine     Past Surgical History  Procedure Laterality Date  . Tubal ligation    . Laparoscopic appendectomy  03/14/2011    Procedure: APPENDECTOMY LAPAROSCOPIC;  Surgeon: Shann Medal, MD;  Location: [place];  Service: General;  Laterality: N/A;  . Appendectomy  03/14/11    There were no vitals filed for this visit.  Visit Diagnosis:  Stiffness of joint, lower leg, left  Knee pain, chronic, left  Difficulty walking up stairs      Subjective Assessment - 06/23/14 1102    Symptoms The cortisone shot helped.  He siad my knee cap is moving in a J shape and he wants me to straighten it out.  My back hurts now.    Currently in Pain? Yes   Pain Score 3    Pain Location Knee   Pain Score 8   Pain Type Chronic pain   Pain Location Back   Pain Orientation Upper;Mid;Right   Pain Onset On-going             [facility] Adult PT Treatment/Exercise - 06/23/14 1106    Knee/Hip Exercises: Aerobic   Stationary Bike level 2 for 7 min    Knee/Hip Exercises: Seated   Long Arc Quad Left;1 set;20 reps;Weights   Long Arc Quad Weight 4 lbs.   Knee/Hip Exercises: Supine   Short Arc Quad Sets Strengthening;Left;1 set;20 reps   Straight Leg Raises Strengthening;Both;1 set;10 reps   Straight Leg Raise with External Rotation Left;1 set;10 reps     Supine: bridging x10, added knee ext x 5 each LE Standing: Wall slides with ball x 10  Towel arcs on LLE x5 (was [previously unable to do) no UE support  Stand hip abd x 10 each needed UE support x 1  Small ROM step ups LLE x 10, decr control  Hamstring stretch x 3LLE Ice pack supine 8 min      PT Long Term Goals - 06/20/14 1111    PT LONG TERM GOAL #1   Title Pt will be able to walk with brace for up to 20-30 min with pain tolerable.    Baseline Pain with walking 5-7/10   Time 6   Period Weeks   Status On-going   PT LONG TERM GOAL #2   Title Pt will be able to negotiate stairs with min pain, UE support   Baseline 6/10  12 steps   Time 6   Period Weeks   Status On-going   PT LONG TERM GOAL #3   Title Pt will be able to improve antalgic gait following PT session   Baseline Limps less after PT    Time 6   Period Weeks   Status On-going   PT LONG  TERM GOAL #4   Title Pt. will demo hip strength to 4/5 or greater for improved gait stability and work activities   Baseline Adductor4-/5, Extension, Abduction, Flexion 4/5   Time 6   Period Weeks   Status Partially Met               Plan - 06/23/14 1107    Clinical Impression Statement Pt responded well to her injection and was able to tolerate ex better, pain less than 4/10 in knee the entire session. Will eval back neck week.    PT Next Visit Plan Renewal, ERO for lumbar spine to include knee PT    PT Home Exercise Plan cont    Consulted and Agree with Plan of Care Patient        Problem List Patient Active Problem List   Diagnosis Date Noted  . Left knee pain 06/21/2014  . GERD (gastroesophageal reflux disease) 03/14/2013  . Anemia 03/14/2013  . Smoking 03/14/2013  . Appendicitis 03/14/2011    PAA,JENNIFER 06/23/2014, 11:46 AM  [facility] [address] [place], [place], [address] Phone: [phone]   Fax:  [phone]

## 2014-07-03 ENCOUNTER — Ambulatory Visit: Payer: No Typology Code available for payment source | Admitting: Physical Therapy

## 2014-07-05 ENCOUNTER — Encounter: Payer: Self-pay | Admitting: Physical Therapy

## 2014-07-05 ENCOUNTER — Ambulatory Visit: Payer: No Typology Code available for payment source | Admitting: Physical Therapy

## 2014-07-05 DIAGNOSIS — R262 Difficulty in walking, not elsewhere classified: Secondary | ICD-10-CM

## 2014-07-05 DIAGNOSIS — M25562 Pain in left knee: Secondary | ICD-10-CM

## 2014-07-05 DIAGNOSIS — G8929 Other chronic pain: Secondary | ICD-10-CM

## 2014-07-05 DIAGNOSIS — M25662 Stiffness of left knee, not elsewhere classified: Secondary | ICD-10-CM

## 2014-07-05 NOTE — Therapy (Signed)
[place], [place], [address] Phone: [phone]   Fax:  [phone]  Physical Therapy Treatment/Renewal Patient Details  Name: Nicole Cisneros MRN: [id number] Date of Birth: 1970-12-22 Referring Provider:  Lorayne Marek, MD  Encounter Date: 07/05/2014      PT End of Session - 07/05/14 1037    Visit Number 9   Number of Visits 24   Date for PT Re-Evaluation 08/16/14   PT Start Time 1017   PT Stop Time 1106   PT Time Calculation (min) 49 min   Activity Tolerance Patient tolerated treatment well      Past Medical History  Diagnosis Date  . Headache(784.0)   . Anemia   . Weight loss   . Sore throat   . Cough   . Constipation   . Weakness   . COPD (chronic obstructive pulmonary disease)   . Migraine     Past Surgical History  Procedure Laterality Date  . Tubal ligation    . Laparoscopic appendectomy  03/14/2011    Procedure: APPENDECTOMY LAPAROSCOPIC;  Surgeon: Shann Medal, MD;  Location: [place];  Service: General;  Laterality: N/A;  . Appendectomy  03/14/11    There were no vitals filed for this visit.  Visit Diagnosis:  Stiffness of joint, lower leg, left  Knee pain, chronic, left  Difficulty walking up stairs      Subjective Assessment - 07/05/14 1029    Symptoms Min to mod knee pain today, 5/10.  I feel the creaking in my knee. The shot is wearing off.    How long can you stand comfortably? 60 min    How long can you walk comfortably? 45-60 min, uses cart in the store   Currently in Pain? Yes   Pain Score 5    Pain Location Knee   Pain Orientation Left   Pain Type Chronic pain   Pain Onset More than a month ago            [facility] PT Assessment - 07/05/14 1033    Strength   Right Knee Flexion 5/5   Right Knee Extension 5/5   Left Knee Flexion 4+/5   Left Knee Extension 4+/5                   [facility] Adult PT Treatment/Exercise - 07/05/14 1047    Knee/Hip  Exercises: Stretches   Active Hamstring Stretch 2 reps;30 seconds   ITB Stretch 2 reps;30 seconds   Knee/Hip Exercises: Aerobic   Stationary Bike NuStep level 6, 6 min   Knee/Hip Exercises: Standing   Forward Lunges Both;3 sets;Other (comment)   Forward Lunges Limitations 30 sec   Functional Squat 2 sets;10 reps   SLS with Vectors hip abd and hip ext x 10 each   Knee/Hip Exercises: Seated   Long Arc Quad Strengthening;Left;2 sets;10 reps   Long Arc Quad Weight 4 lbs.   Long Arc Quad Limitations ball squeeze   Knee/Hip Exercises: Supine   Straight Leg Raises Strengthening;Left;2 sets   Straight Leg Raise with External Rotation Left;1 set;10 reps   Other Supine Knee Exercises bridges x 10    Modalities   Modalities Cryotherapy   Cryotherapy   Number Minutes Cryotherapy 8 Minutes   Cryotherapy Location Knee   Type of Cryotherapy Ice pack                PT Education - 07/05/14 1241    Education provided Yes  Education Details POC, renewal and possible PT for back   Person(s) Educated Patient   Methods Explanation;Demonstration   Comprehension Verbalized understanding;Returned demonstration             PT Long Term Goals - 07/05/14 1041    PT LONG TERM GOAL #1   Title Pt will be able to walk with brace for up to 20-30 min with pain tolerable.    Status Achieved   PT LONG TERM GOAL #2   Title Pt will be able to negotiate stairs with min pain, UE support   Status On-going   PT LONG TERM GOAL #3   Title Pt will be able to improve antalgic gait following PT session   Status On-going   PT LONG TERM GOAL #4   Title Pt. will demo hip strength to 4/5 or greater for improved gait stability and work activities   Status Partially Met               Plan - 07/05/14 1056    Clinical Impression Statement Patient reports a 75%  improvement since beginning PT. She can tolerate more exercise, activity and work.  She cont to have limp, weakness in LE and core. She  also c/o pain in mid and low back which contribute to decr mobility, pain with standing and walking. She will benefit from PT eval and treatment for back pain.    Pt will benefit from skilled therapeutic intervention in order to improve on the following deficits Abnormal gait;Decreased range of motion;Difficulty walking;Impaired flexibility;Improper body mechanics;Increased edema;Decreased activity tolerance;Decreased balance;Decreased mobility;Decreased strength   Rehab Potential Good   PT Frequency 2x / week   PT Duration 6 weeks   PT Treatment/Interventions Therapeutic activities;Patient/family education;Passive range of motion;Therapeutic exercise;DME Instruction;Gait training;Ultrasound;Balance training;Manual techniques;Neuromuscular re-education;Stair training;Cryotherapy;Electrical Stimulation;Functional mobility training   PT Next Visit Plan Renewal, ERO for lumbar spine to include knee PT    PT Home Exercise Plan cont    Consulted and Agree with Plan of Care Patient        Problem List Patient Active Problem List   Diagnosis Date Noted  . Left knee pain 06/21/2014  . GERD (gastroesophageal reflux disease) 03/14/2013  . Anemia 03/14/2013  . Smoking 03/14/2013  . Appendicitis 03/14/2011    PAA,JENNIFER 07/05/2014, 12:45 PM  [facility] [address] [place], [place], [address] Phone: [phone]   Fax:  [phone]

## 2014-07-17 ENCOUNTER — Ambulatory Visit: Payer: No Typology Code available for payment source | Admitting: Physical Therapy

## 2014-07-21 ENCOUNTER — Ambulatory Visit: Payer: No Typology Code available for payment source | Attending: Physician Assistant | Admitting: Physical Therapy

## 2014-07-21 DIAGNOSIS — M25662 Stiffness of left knee, not elsewhere classified: Secondary | ICD-10-CM

## 2014-07-21 DIAGNOSIS — R262 Difficulty in walking, not elsewhere classified: Secondary | ICD-10-CM

## 2014-07-21 DIAGNOSIS — M25562 Pain in left knee: Secondary | ICD-10-CM | POA: Insufficient documentation

## 2014-07-21 DIAGNOSIS — G8929 Other chronic pain: Secondary | ICD-10-CM

## 2014-07-21 NOTE — Therapy (Signed)
[place], [place], [address] Phone: [phone]   Fax:  [phone]  Physical Therapy Treatment  Patient Details  Name: DANIAL SISLEY MRN: [id number] Date of Birth: 22-Nov-1970 Referring Provider:  Lorayne Marek, MD  Encounter Date: 07/21/2014      PT End of Session - 07/21/14 1119    Visit Number 10   Number of Visits 24   Date for PT Re-Evaluation 08/16/14   PT Start Time 1056   PT Stop Time 1155   PT Time Calculation (min) 59 min   Activity Tolerance Patient tolerated treatment well   Behavior During Therapy [facility] for tasks assessed/performed      Past Medical History  Diagnosis Date  . Headache(784.0)   . Anemia   . Weight loss   . Sore throat   . Cough   . Constipation   . Weakness   . COPD (chronic obstructive pulmonary disease)   . Migraine     Past Surgical History  Procedure Laterality Date  . Tubal ligation    . Laparoscopic appendectomy  03/14/2011    Procedure: APPENDECTOMY LAPAROSCOPIC;  Surgeon: Shann Medal, MD;  Location: [place];  Service: General;  Laterality: N/A;  . Appendectomy  03/14/11    There were no vitals filed for this visit.  Visit Diagnosis:  Stiffness of joint, lower leg, left  Knee pain, chronic, left  Difficulty walking up stairs      Subjective Assessment - 07/21/14 1059    Subjective Pain is minimal today (6/10), but states that her leg "gave out" on her and she fell on Friday. Now pain "moved from my knee to upper thigh, I feel it deep in both thighs".      Pain Score 6    Pain Location --  Left and Right ant thigh, L>R   Pain Orientation Left   Pain Descriptors / Indicators Aching   Pain Type Chronic pain   Pain Radiating Towards anterior thigh   Multiple Pain Sites No          [facility] PT Assessment - 07/21/14 1150    Strength   Left Hip ADduction 4/5          [facility] Adult PT Treatment/Exercise - 07/21/14 1120    Knee/Hip  Exercises: Aerobic   Stationary Bike NuStep level 6, 7 min   Knee/Hip Exercises: Machines for Strengthening   Total Gym Leg Press 2 plates double legs T70, 1 plate double leg V77   Knee/Hip Exercises: Standing   SLS on foam 20 seconds   Rebounder SLS 20 max, couldn't do on foam   Knee/Hip Exercises: Supine   Bridges 2 sets;Strengthening;5 reps  ball between knees   Bridges Limitations needed rest break   Straight Leg Raises Strengthening;Both;2 sets;10 reps;Other (comment)  3 lbs ankle wts   Straight Leg Raise with External Rotation Strengthening;Both;1 set;10 reps;Other (comment)  3 lb ankle wts   Other Supine Knee Exercises sideline hip abd with 3 lb ankle wts x 10reps, 2 sets   Moist Heat Therapy   Number Minutes Moist Heat 15 Minutes   Moist Heat Location --  both anterior thighs           PT Education - 07/21/14 1206    Education provided Yes   Education Details pain management, massage thighs, HEP   Person(s) Educated Patient   Methods Explanation;Demonstration   Comprehension Verbalized understanding  PT Long Term Goals - 07/21/14 1111    PT LONG TERM GOAL #1   Title Pt will be able to walk with brace for up to 20-30 min with pain tolerable.    Status Achieved   PT LONG TERM GOAL #2   Title Pt will be able to negotiate stairs with min pain, UE support   Status Partially Met   PT LONG TERM GOAL #3   Title Pt will be able to improve antalgic gait following PT session   Status Achieved   PT LONG TERM GOAL #4   Title Pt. will demo hip strength to 4/5 or greater for improved gait stability and work activities   Status Achieved          Plan - 07/21/14 1208    Clinical Impression Statement Pt came in today with no limp, c/o anterior thigh pain bilaterally that started after she fell on Friday, did not c/o back pain today. Pt tolerated treatment well, she continues to improve in her LLE and core strength and L knee stability.    PT Next Visit Plan  Renewal, ERO for lumbar spine to include knee PT, check on anterior thigh pain, continue LLE strengthening, incorporate static balance exercises   PT Home Exercise Plan as previous   Consulted and Agree with Plan of Care Patient     Problem List Patient Active Problem List   Diagnosis Date Noted  . Left knee pain 06/21/2014  . GERD (gastroesophageal reflux disease) 03/14/2013  . Anemia 03/14/2013  . Smoking 03/14/2013  . Appendicitis 03/14/2011    Ileana Roup 07/21/2014, 12:16 PM  [facility] [address] [place], [place], [address] Phone: [phone]   Fax:  [phone]

## 2014-07-24 ENCOUNTER — Ambulatory Visit: Payer: No Typology Code available for payment source | Admitting: Physical Therapy

## 2014-07-24 DIAGNOSIS — R262 Difficulty in walking, not elsewhere classified: Secondary | ICD-10-CM

## 2014-07-24 DIAGNOSIS — M25662 Stiffness of left knee, not elsewhere classified: Secondary | ICD-10-CM

## 2014-07-24 DIAGNOSIS — M545 Low back pain: Secondary | ICD-10-CM

## 2014-07-24 DIAGNOSIS — M546 Pain in thoracic spine: Secondary | ICD-10-CM

## 2014-07-24 DIAGNOSIS — G8929 Other chronic pain: Secondary | ICD-10-CM

## 2014-07-24 DIAGNOSIS — M25562 Pain in left knee: Secondary | ICD-10-CM

## 2014-07-24 NOTE — Patient Instructions (Signed)
BACK: Child's Pose (Sciatica)   Sit in knee-chest position and reach arms forward. Separate knees for comfort. Hold position for _3__ breaths. Repeat _3-5__ times. Do __2_ times per day.  Copyright  VHI. All rights reserved.  PELVIC STABILIZATION: Pelvic Tilt (Lying)   Exhaling, pull belly toward spine, tilting pelvis forward. Inhaling, release. Repeat _10__ times. Do 2___ times per day.  Copyright  VHI. All rights reserved.  Abduction: Clam (Eccentric) - Side-Lying   Lie on side with knees bent. Lift top knee, keeping feet together. Keep trunk steady. Slowly lower for 3-5 seconds. _10-20__ reps per set, __1_ sets per day, __5_ days per week. Add ___ lbs when you achieve ___ repetitions.  Copyright  VHI. All rights reserved.
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
## 2014-07-28 ENCOUNTER — Ambulatory Visit: Payer: No Typology Code available for payment source | Admitting: Physical Therapy

## 2014-07-31 ENCOUNTER — Ambulatory Visit: Payer: No Typology Code available for payment source | Admitting: Physical Therapy

## 2014-07-31 DIAGNOSIS — G8929 Other chronic pain: Secondary | ICD-10-CM

## 2014-07-31 DIAGNOSIS — M25562 Pain in left knee: Secondary | ICD-10-CM

## 2014-07-31 DIAGNOSIS — M25662 Stiffness of left knee, not elsewhere classified: Secondary | ICD-10-CM

## 2014-07-31 DIAGNOSIS — M545 Low back pain: Secondary | ICD-10-CM

## 2014-07-31 DIAGNOSIS — R262 Difficulty in walking, not elsewhere classified: Secondary | ICD-10-CM

## 2014-07-31 NOTE — Patient Instructions (Signed)
Abduction: Clam (Eccentric) - Side-Lying   Lie on side with knees bent. Lift top knee, keeping feet together. Keep trunk steady. Slowly lower for 3-5 seconds. 10___ reps per set, _3__ sets per day, _5__ days per week.   Copyright  VHI. All rights reserved.   Low Back Stretch: Two Leg (Supine)   Lying on back, bring knees toward chest by pulling gently behind knees. Hold __60__ seconds. Repeat 3-5 times a day.  Copyright  VHI. All rights reserved.

## 2014-07-31 NOTE — Therapy (Signed)
[place], [place], [address] Phone: [phone]   Fax:  [phone]  Physical Therapy Treatment  Patient Details  Name: Nicole Cisneros MRN: [id number] Date of Birth: 05-03-1970 Referring Provider:  Lorayne Marek, MD  Encounter Date: 07/31/2014      PT End of Session - 07/31/14 1200    Visit Number 12   Number of Visits 24   Date for PT Re-Evaluation 09/04/14   PT Start Time 1053   PT Stop Time 1154   PT Time Calculation (min) 61 min   Activity Tolerance Patient tolerated treatment well;No increased pain   Behavior During Therapy [facility] for tasks assessed/performed      Past Medical History  Diagnosis Date  . Headache(784.0)   . Anemia   . Weight loss   . Sore throat   . Cough   . Constipation   . Weakness   . COPD (chronic obstructive pulmonary disease)   . Migraine     Past Surgical History  Procedure Laterality Date  . Tubal ligation    . Laparoscopic appendectomy  03/14/2011    Procedure: APPENDECTOMY LAPAROSCOPIC;  Surgeon: Shann Medal, MD;  Location: [place];  Service: General;  Laterality: N/A;  . Appendectomy  03/14/11    There were no vitals filed for this visit.  Visit Diagnosis:  Stiffness of joint, lower leg, left  Knee pain, chronic, left  Left low back pain, with sciatica presence unspecified  Difficulty walking up stairs      Subjective Assessment - 07/31/14 1106    Subjective L knee hurts today more than usual possibly because of the work that [place] had to do last week, wants to work on her low back and knee today.    Currently in Pain? Yes   Pain Score 8    Pain Location Knee   Pain Orientation Left;Lateral   Pain Type Chronic pain   Multiple Pain Sites Yes  low back, moderate, not rated           [facility] Adult PT Treatment/Exercise - 07/31/14 1112    Lumbar Exercises: Stretches   Active Hamstring Stretch 3 reps;30 seconds   Double Knee to Chest Stretch  3 reps;30 seconds   Lower Trunk Rotation 3 reps;20 seconds  both sides   ITB Stretch 3 reps;30 seconds   Lumbar Exercises: Supine   Bridge 10 reps;5 seconds  feet on the ball (orange)   Other Supine Lumbar Exercises clams with green band, each side x 10, 2 sets   Lumbar Exercises: Quadruped   Opposite Arm/Leg Raise 10 reps  worked arms only with ball behind for stability   Knee/Hip Exercises: Aerobic   Stationary Bike 7 minutes, level 3   Manual Therapy   Manual Therapy --  McConnel's taping for L knee lateral patella gliding     Pt is taking her time during exercises and stretches to avoid agrevating pain. 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         PT Education - 07/31/14 1126    Education provided Yes   Education Details HEP - double knee to chest, clams   Person(s) Educated Patient   Methods Explanation;Demonstration;Tactile cues;Verbal cues   Comprehension Verbalized understanding;Returned demonstration;Verbal cues required          PT Long Term Goals - 07/31/14 1159    PT LONG TERM GOAL #1   Title Pt will be able to  walk with brace for up to 20-30 min with pain tolerable.    Status Achieved   PT LONG TERM GOAL #2   Title Pt will be able to negotiate stairs with min pain, UE support   PT LONG TERM GOAL #3   Title Pt will be able to improve antalgic gait following PT session   Status Achieved   PT LONG TERM GOAL #4   Title Pt. will demo hip strength to 4/5 or greater for improved gait stability and work activities   Status Partially Met   PT LONG TERM GOAL #5   Title Patient will be able to stand/walk for up to an hour with pain <3/10 in low back.    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     Status On-going   PT LONG TERM GOAL #7   Title Pt will be able to demo safe lifting, body mechanics to prevent re-injury.     Status On-going          Plan - 07/31/14 1201    Clinical Impression Statement Princetta performed well doing lumbar exercises, at the end of the session c/o not feeling well and asked for BP measurement, BP 125/80 WNL, O2 sat and HR were WNL (98 and 62 respectively). Tried taping left knee today for lateral patella glide.    PT Next Visit Plan Check pelvis in standing, try hip hinging ex., body mech, quadruped stab and give  HEP, balance screen, give handout for clams and double knee to chest, please   PT Home Exercise Plan added clams and double knee to chest, but didn't give hand out, give hand out please   Consulted and Agree with Plan of Care Patient     Problem List Patient Active Problem List   Diagnosis Date Noted  . Left knee pain 06/21/2014  . GERD (gastroesophageal reflux disease) 03/14/2013  . Anemia 03/14/2013  . Smoking 03/14/2013  . Appendicitis 03/14/2011    Ileana Roup 07/31/2014, 12:06 PM  [facility] [address] [place], [place], [address] Phone: [phone]   Fax:  [phone]

## 2014-08-07 ENCOUNTER — Ambulatory Visit: Payer: No Typology Code available for payment source | Admitting: Physical Therapy

## 2014-08-11 ENCOUNTER — Ambulatory Visit: Payer: No Typology Code available for payment source | Admitting: Physical Therapy

## 2014-08-11 DIAGNOSIS — M545 Low back pain: Secondary | ICD-10-CM

## 2014-08-11 DIAGNOSIS — M546 Pain in thoracic spine: Secondary | ICD-10-CM

## 2014-08-11 NOTE — Therapy (Signed)
[place], [place], [address] Phone: [phone]   Fax:  [phone]  Physical Therapy Treatment  Patient Details  Name: Nicole Cisneros MRN: [id number] Date of Birth: 1971/01/24 Referring Provider:  Lorayne Marek, MD  Encounter Date: 08/11/2014      PT End of Session - 08/11/14 1206    Visit Number 13   Number of Visits 24   Date for PT Re-Evaluation 09/04/14   PT Start Time 1058   PT Stop Time 1155   PT Time Calculation (min) 57 min   Activity Tolerance Patient tolerated treatment well   Behavior During Therapy [facility] for tasks assessed/performed      Past Medical History  Diagnosis Date  . Headache(784.0)   . Anemia   . Weight loss   . Sore throat   . Cough   . Constipation   . Weakness   . COPD (chronic obstructive pulmonary disease)   . Migraine     Past Surgical History  Procedure Laterality Date  . Tubal ligation    . Laparoscopic appendectomy  03/14/2011    Procedure: APPENDECTOMY LAPAROSCOPIC;  Surgeon: Shann Medal, MD;  Location: [place];  Service: General;  Laterality: N/A;  . Appendectomy  03/14/11    There were no vitals filed for this visit.  Visit Diagnosis:  Left low back pain, with sciatica presence unspecified  Midline thoracic back pain      Subjective Assessment - 08/11/14 1102    Subjective Back hurts today 6/10. Anterior thigh also hurts 6/10, especially when weight bearing and climbing stairs. Wants to work on her back today.    Currently in Pain? Yes   Pain Score 6    Pain Location Back   Pain Orientation Mid;Right   Pain Descriptors / Indicators Aching   Pain Type Chronic pain   Pain Onset More than a month ago   Pain Frequency Intermittent   Aggravating Factors  Working, sitting on the bus coming here, going up the stairs    Pain Relieving Factors rest   Multiple Pain Sites Yes  anterior thigh          [facility] Adult PT Treatment/Exercise - 08/11/14  1107    Lumbar Exercises: Stretches   Single Knee to Chest Stretch 4 reps;30 seconds   Double Knee to Chest Stretch 60 seconds;1 rep  Child's pose and supine   Lower Trunk Rotation 4 reps;30 seconds   Lumbar Exercises: Quadruped   Single Arm Raise 10 reps;3 seconds   Other Quadruped Lumbar Exercise child's pose x 2, 30 sec   Knee/Hip Exercises: Aerobic   Nu Step Nu Step 7 minutes, level 2 (had to decrease from 6 to 4 to 2 because her Lt knee was bothering her incr in pain)   Manual Therapy   Massage thoracic back STW   Myofascial Release rhomboids, teres major, and middle trap trigger point release     Discussed dry needling with [place] and educated her on benefits, physiological effects, and procedure. She said she will think about it.       PT Long Term Goals - 07/31/14 1159    PT LONG TERM GOAL #1   Title Pt will be able to walk with brace for up to 20-30 min with pain tolerable.    Status Achieved   PT LONG TERM GOAL #2   Title Pt will be able to negotiate stairs with min pain, UE support   PT LONG TERM  GOAL #3   Title Pt will be able to improve antalgic gait following PT session   Status Achieved   PT LONG TERM GOAL #4   Title Pt. will demo hip strength to 4/5 or greater for improved gait stability and work activities   Status Partially Met   PT LONG TERM GOAL #5   Title Patient will be able to stand/walk for up to an hour with pain <3/10 in low back.    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     Status On-going   PT LONG TERM GOAL #7   Title Pt will be able to demo safe lifting, body mechanics to prevent re-injury.    Status On-going           Plan - 08/11/14 1208    Clinical Impression Statement Kiki c/o localized back pain today. She was found to have significant pain in post shoulder (teres, rhomboid) during trigger point release. Applied biofreeze at the end of the session  for pain.  No further goals met.  Will attempt to schedule with TPDN therapist and continue with POC.     PT Treatment/Interventions Moist Heat;Therapeutic activities;Patient/family education;Passive range of motion;Therapeutic exercise;Ultrasound;Balance training;Manual techniques;Cryotherapy;Stair training;Neuromuscular re-education;Functional mobility training;Electrical Stimulation;ADLs/Self Care Home Management;Dry needling   PT Next Visit Plan Check pelvis in standing, try hip hinging ex., body mech, quadruped stab and give  HEP, balance screen, give handout for clams and double knee to chest, please. Update goals.    PT Home Exercise Plan as previous   Recommended Other Services DN for TrP release   Consulted and Agree with Plan of Care Patient        Problem List Patient Active Problem List   Diagnosis Date Noted  . Left knee pain 06/21/2014  . GERD (gastroesophageal reflux disease) 03/14/2013  . Anemia 03/14/2013  . Smoking 03/14/2013  . Appendicitis 03/14/2011    PAA,JENNIFER 08/11/2014, 6:36 PM  [facility] [address] [place], [place], [address] Phone: [phone]   Fax:  [phone]

## 2014-08-29 ENCOUNTER — Ambulatory Visit: Payer: No Typology Code available for payment source | Attending: Physician Assistant | Admitting: Physical Therapy

## 2014-08-29 DIAGNOSIS — M545 Low back pain: Secondary | ICD-10-CM

## 2014-08-29 DIAGNOSIS — G8929 Other chronic pain: Secondary | ICD-10-CM | POA: Insufficient documentation

## 2014-08-29 DIAGNOSIS — M25662 Stiffness of left knee, not elsewhere classified: Secondary | ICD-10-CM

## 2014-08-29 DIAGNOSIS — M25562 Pain in left knee: Secondary | ICD-10-CM | POA: Insufficient documentation

## 2014-08-29 DIAGNOSIS — M546 Pain in thoracic spine: Secondary | ICD-10-CM

## 2014-08-29 DIAGNOSIS — R262 Difficulty in walking, not elsewhere classified: Secondary | ICD-10-CM | POA: Insufficient documentation

## 2014-08-29 NOTE — Patient Instructions (Signed)
    http://gt2.exer.us/243   Copyright  VHI. All rights reserved.  Back Hyperextension: Using Arms     Lie face down, hands close to chest. Press trunk up, arching back. Keep neck long, shoulders down. Tighten buttocks to protect lower back. Hold ____ seconds. Repeat ____ times. Do ____ sessions per day.  Copyright  VHI. All rights reserved.   Copyright  VHI. All rights reserved.   Copyright  VHI. All rights reserved.  Backward Bend (Standing)   Arch backward to make hollow of back deeper. Hold ____ seconds. Repeat ____ times per set. Do ____ sets per session. Do ____ sessions per day.  http://orth.exer.us/178   Copyright  VHI. All rights reserved.   Trigger Point Dry Needling  . What is Trigger Point Dry Needling (DN)? o DN is a physical therapy technique used to treat muscle pain and dysfunction. Specifically, DN helps deactivate muscle trigger points (muscle knots).  o A thin filiform needle is used to penetrate the skin and stimulate the underlying trigger point. The goal is for a local twitch response (LTR) to occur and for the trigger point to relax. No medication of any kind is injected during the procedure.   . What Does Trigger Point Dry Needling Feel Like?  o The procedure feels different for each individual patient. Some patients report that they do not actually feel the needle enter the skin and overall the process is not painful. Very mild bleeding may occur. However, many patients feel a deep cramping in the muscle in which the needle was inserted. This is the local twitch response.   Marland Kitchen How Will I feel after the treatment? o Soreness is normal, and the onset of soreness may not occur for a few hours. Typically this soreness does not last longer than two days.  o Bruising is uncommon, however; ice can be used to decrease any possible bruising.  o In rare cases feeling tired or nauseous after the treatment is normal. In addition, your symptoms may get worse  before they get better, this period will typically not last longer than 24 hours.   . What Can I do After My Treatment? o Increase your hydration by drinking more water for the next 24 hours. o You may place ice or heat on the areas treated that have become sore, however, do not use heat on inflamed or bruised areas. Heat often brings more relief post needling. o You can continue your regular activities, but vigorous activity is not recommended initially after the treatment for 24 hours. o DN is best combined with other physical therapy such as strengthening, stretching, and other therapies.

## 2014-08-29 NOTE — Therapy (Signed)
[place], [place], [address] Phone: [phone]   Fax:  [phone]  Physical Therapy Treatment  Patient Details  Name: Nicole Cisneros MRN: [id number] Date of Birth: [birth year] Referring Provider:  Lorayne Marek, MD  Encounter Date: 08/29/2014      PT End of Session - 08/29/14 1326    Visit Number 14   Number of Visits 24   Date for PT Re-Evaluation 09/04/14   PT Start Time 0930   PT Stop Time 1027   PT Time Calculation (min) 57 min   Activity Tolerance Patient tolerated treatment well      Past Medical History  Diagnosis Date  . Headache(784.0)   . Anemia   . Weight loss   . Sore throat   . Cough   . Constipation   . Weakness   . COPD (chronic obstructive pulmonary disease)   . Migraine     Past Surgical History  Procedure Laterality Date  . Tubal ligation    . Laparoscopic appendectomy  03/14/2011    Procedure: APPENDECTOMY LAPAROSCOPIC;  Surgeon: Shann Medal, MD;  Location: [place];  Service: General;  Laterality: N/A;  . Appendectomy  03/14/11    There were no vitals filed for this visit.  Visit Diagnosis:  Left low back pain, with sciatica presence unspecified  Midline thoracic back pain  Stiffness of joint, lower leg, left      Subjective Assessment - 08/29/14 0934    Subjective Last week had cramping in her left foot when she woke up and right thigh pain after work.  Currently starts upper back and radiates down to her lower back today.     Currently in Pain? Yes   Pain Score 7    Pain Location Back   Pain Orientation Mid   Pain Type Chronic pain   Aggravating Factors  working all day; sometimes walking; sitting   Pain Relieving Factors pain pills;  sitting on bed with legs extended out                         [facility] Adult PT Treatment/Exercise - 08/29/14 1001    Exercises   Exercises Lumbar   Lumbar Exercises: Standing   Other Standing Lumbar  Exercises standing back extension 10x   Lumbar Exercises: Seated   Other Seated Lumbar Exercises seated thoracic extension over ball 15x   Lumbar Exercises: Prone   Other Prone Lumbar Exercises prone press up 10x   Moist Heat Therapy   Number Minutes Moist Heat 12 Minutes   Moist Heat Location --  thoracic and lumbar prone   Manual Therapy   Myofascial Release rhomboids, teres major, and middle trap trigger point release          Trigger Point Dry Needling - 08/29/14 1325    Consent Given? Yes   Education Handout Provided Yes  including risks/signs and symptoms of pneumothorax   Muscles Treated Upper Body Rhomboids;Subscapularis;Infraspinatus   Muscles Treated Lower Body --  bilateral lumbar multifidi   Rhomboids Response Palpable increased muscle length   Infraspinatus Response Palpable increased muscle length   Subscapularis Response Palpable increased muscle length              PT Education - 08/29/14 1250    Education provided Yes   Education Details dry needling info;  prone and standing back extensions    Person(s) Educated Patient   Methods Explanation;Demonstration;Handout   Comprehension  Verbalized understanding;Returned demonstration             PT Long Term Goals - 08/29/14 1741    PT LONG TERM GOAL #1   Title Pt will be able to walk with brace for up to 20-30 min with pain tolerable.    Status Achieved   PT LONG TERM GOAL #2   Title Pt will be able to negotiate stairs with min pain, UE support   Time 6   Period Weeks   Status Partially Met   PT LONG TERM GOAL #3   Title Pt will be able to improve antalgic gait following PT session   Status Achieved   PT LONG TERM GOAL #4   Title Pt. will demo hip strength to 4/5 or greater for improved gait stability and work activities   Time 6   Period Weeks   Status Partially Met   PT LONG TERM GOAL #5   Title Patient will be able to stand/walk for up to an hour with pain <3/10 in low back.    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     Time 6   Period Weeks   Status On-going   PT LONG TERM GOAL #7   Title Pt will be able to demo safe lifting, body mechanics to prevent re-injury.    Time 6   Period Weeks   Status On-going               Plan - 08/29/14 1735    Clinical Impression Statement Assess response to dry needling, manual interventions and associated home stretching of these areas.  Patient has multi-regional pain which may or may not respond to these specific muscular interventions.  She seems fearful of performing her home exercises and has discontinued some in response to increased pain (quadruped bird dogs).  Therapist closely monitoring pain throughout treatment session and monitoring as needed.  Assess progress toward goals next visit for recertification vs. discharge from PT.   PT Next Visit Plan Assess response to dry needling, manual interventions and thoracic and lumbar extension HEP;  assess goals and recert vs. discharge from PT        Problem List Patient Active Problem List   Diagnosis Date Noted  . Left knee pain 06/21/2014  . GERD (gastroesophageal reflux disease) 03/14/2013  . Anemia 03/14/2013  . Smoking 03/14/2013  . Appendicitis 03/14/2011    Alvera Singh 08/29/2014, 5:43 PM  [facility] [address] [place], [place], [address] Phone: [phone]   Fax:  [phone]  Ruben Im, PT 08/29/2014 5:43 PM Phone: [phone] Fax: [phone]

## 2014-09-01 ENCOUNTER — Ambulatory Visit: Payer: No Typology Code available for payment source | Admitting: Physical Therapy

## 2014-09-01 DIAGNOSIS — M546 Pain in thoracic spine: Secondary | ICD-10-CM

## 2014-09-01 DIAGNOSIS — M25562 Pain in left knee: Secondary | ICD-10-CM

## 2014-09-01 DIAGNOSIS — M25662 Stiffness of left knee, not elsewhere classified: Secondary | ICD-10-CM

## 2014-09-01 DIAGNOSIS — G8929 Other chronic pain: Secondary | ICD-10-CM

## 2014-09-01 DIAGNOSIS — R262 Difficulty in walking, not elsewhere classified: Secondary | ICD-10-CM

## 2014-09-01 DIAGNOSIS — M545 Low back pain: Secondary | ICD-10-CM

## 2014-09-01 NOTE — Therapy (Signed)
[place], [place], [address] Phone: [phone]   Fax:  [phone]  Physical Therapy Treatment  Patient Details  Name: Nicole Cisneros MRN: [id number] Date of Birth: 1970-05-19 Referring Provider:  Lorayne Marek, MD  Encounter Date: 09/01/2014      PT End of Session - 09/01/14 0933    Visit Number 15   Number of Visits 24   Date for PT Re-Evaluation 09/04/14   PT Start Time 0930   PT Stop Time 1025   PT Time Calculation (min) 55 min   Activity Tolerance Patient tolerated treatment well   Behavior During Therapy [facility] for tasks assessed/performed      Past Medical History  Diagnosis Date  . Headache(784.0)   . Anemia   . Weight loss   . Sore throat   . Cough   . Constipation   . Weakness   . COPD (chronic obstructive pulmonary disease)   . Migraine     Past Surgical History  Procedure Laterality Date  . Tubal ligation    . Laparoscopic appendectomy  03/14/2011    Procedure: APPENDECTOMY LAPAROSCOPIC;  Surgeon: Shann Medal, MD;  Location: [place];  Service: General;  Laterality: N/A;  . Appendectomy  03/14/11    There were no vitals filed for this visit.  Visit Diagnosis:  Left low back pain, with sciatica presence unspecified  Midline thoracic back pain  Stiffness of joint, lower leg, left  Knee pain, chronic, left  Difficulty walking up stairs      Subjective Assessment - 09/01/14 0935    Subjective No knee pain today, back is much better. Liked dry needling.    How long can you sit comfortably? not limited   How long can you stand comfortably? stands at work and reports min pain overall    How long can you walk comfortably? Has to take her time related to knee pain. Sometimes back pain increases.    Currently in Pain? No/denies            [facility] PT Assessment - 09/01/14 0937    AROM   Right Knee Flexion 125   Left Knee Flexion 125   Lumbar Flexion WNL   Lumbar  Extension WNL   Lumbar - Right Side Bend WNL   Lumbar - Left Side Bend WNL   Lumbar - Right Rotation WNL   Lumbar - Left Rotation WNL   Strength   Right Hip Flexion 5/5   Right Hip ABduction 4+/5   Left Hip Flexion 5/5   Left Hip ABduction 4+/5   Right Knee Flexion 5/5   Right Knee Extension 4+/5   Left Knee Flexion 5/5   Left Knee Extension 4+/5   Palpation   Palpation TTP with patellar mob            [facility] Adult PT Treatment/Exercise - 09/01/14 0950    Lumbar Exercises: Stretches   Single Knee to Chest Stretch 2 reps;30 seconds   Lower Trunk Rotation 5 reps   Lower Trunk Rotation Limitations 2 sets   Lumbar Exercises: Aerobic   Stationary Bike NuStep level 7 UE and LE    Knee/Hip Exercises: Supine   Bridges Both;1 set;10 reps   Straight Leg Raises Strengthening;Both;2 sets;10 reps;Other (comment)   Straight Leg Raise with External Rotation Strengthening;Both;1 set;10 reps;Other (comment)   Knee/Hip Exercises: Sidelying   Hip ABduction Strengthening;Both;1 set;10 reps   Moist Heat Therapy   Number Minutes Moist Heat 15  Minutes   Moist Heat Location Lumbar Spine                PT Education - 09/01/14 0952    Education provided Yes   Education Details HEP and DC   Person(s) Educated Patient   Methods Explanation;Demonstration   Comprehension Verbalized understanding;Returned demonstration             PT Long Term Goals - 09/01/14 0954    PT LONG TERM GOAL #1   Title Pt will be able to walk with brace for up to 20-30 min with pain tolerable.    Status Achieved   PT LONG TERM GOAL #2   Title Pt will be able to negotiate stairs with min pain, UE support   Status Partially Met   PT LONG TERM GOAL #3   Title Pt will be able to improve antalgic gait following PT session   Status Achieved   PT LONG TERM GOAL #4   Title Pt. will demo hip strength to 4/5 or greater for improved gait stability and work activities   Status Achieved   PT LONG TERM GOAL  #5   Title Patient will be able to stand/walk for up to an hour with pain <3/10 in low back.    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     Status Partially Met   PT LONG TERM GOAL #7   Title Pt will be able to demo safe lifting, body mechanics to prevent re-injury.    Status Achieved               Plan - 09/01/14 0953    Clinical Impression Statement Dry needling helped.  She feels ready to DC, will cont with HEP at home.  Cont to have generalized knee and back stiffness during activities.  Work ca be difficult but has improved overall a great deal. Encouraged her to walk, stretch for exercise.    PT Next Visit Plan DC   Consulted and Agree with Plan of Care Patient        Problem List Patient Active Problem List   Diagnosis Date Noted  . Left knee pain 06/21/2014  . GERD (gastroesophageal reflux disease) 03/14/2013  . Anemia 03/14/2013  . Smoking 03/14/2013  . Appendicitis 03/14/2011    PAA,JENNIFER 09/01/2014, 10:19 AM  [facility] [address] [place], [place], [address] Phone: [phone]   Fax:  [phone] Raeford Razor, PT 09/01/2014 10:20 AM Phone: [phone] Fax: [phone]

## 2014-09-01 NOTE — Patient Instructions (Signed)
Cont with HEP as able, daily

## 2014-09-10 ENCOUNTER — Encounter (HOSPITAL_COMMUNITY): Payer: Self-pay | Admitting: Emergency Medicine

## 2014-09-10 ENCOUNTER — Emergency Department (INDEPENDENT_AMBULATORY_CARE_PROVIDER_SITE_OTHER)
Admission: EM | Admit: 2014-09-10 | Discharge: 2014-09-10 | Disposition: A | Discharge status: Home / Self Care | Payer: No Typology Code available for payment source | Source: Home / Self Care | Attending: Emergency Medicine | Admitting: Emergency Medicine

## 2014-09-10 DIAGNOSIS — M545 Low back pain, unspecified: Secondary | ICD-10-CM

## 2014-09-10 DIAGNOSIS — G8929 Other chronic pain: Secondary | ICD-10-CM

## 2014-09-10 DIAGNOSIS — M25511 Pain in right shoulder: Secondary | ICD-10-CM

## 2014-09-10 MED ORDER — METHYLPREDNISOLONE ACETATE 80 MG/ML IJ SUSP
INTRAMUSCULAR | Status: AC
  Filled 2014-09-10: qty 1

## 2014-09-10 MED ORDER — PREDNISONE 50 MG PO TABS: ORAL_TABLET | ORAL | Status: DC

## 2014-09-10 MED ORDER — METHYLPREDNISOLONE ACETATE 80 MG/ML IJ SUSP
80.0000 mg | Freq: Once | INTRAMUSCULAR | Status: AC
  Administered 2014-09-10: 80 mg via INTRAMUSCULAR

## 2014-09-10 MED ORDER — GABAPENTIN 300 MG PO CAPS: 300.0000 mg | ORAL_CAPSULE | Freq: Every day | ORAL | Status: DC

## 2014-09-10 NOTE — ED Provider Notes (Signed)
CSN: [id number]     Arrival date & time 09/10/14  1044 History   First MD Initiated Contact with Patient 09/10/14 1216     Chief Complaint  Patient presents with  . Torticollis  . Shoulder Pain   (Consider location/radiation/quality/duration/timing/severity/associated sxs/prior Treatment) HPI  Nicole Cisneros is a 44 year old woman here for evaluation of right neck and shoulder pain. Nicole Cisneros states this is been intermittent for several months. Nicole Cisneros just underwent a dry needling process 2-3 weeks ago for the pain. Nicole Cisneros states this did not help at all. The pain is located in the right neck and across the right shoulder. Nicole Cisneros denies any radiation down into her arm or hand. No numbness, tingling, weakness in her upper extremities. 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 Nicole Cisneros works in housekeeping.  Nicole Cisneros has tried ibuprofen and Flexeril without improvement.  Past Medical History  Diagnosis Date  . Headache(784.0)   . Anemia   . Weight loss   . Sore throat   . Cough   . Constipation   . Weakness   . COPD (chronic obstructive pulmonary disease)   . Migraine    Past Surgical History  Procedure Laterality Date  . Tubal ligation    . Laparoscopic appendectomy  03/14/2011    Procedure: APPENDECTOMY LAPAROSCOPIC;  Surgeon: Shann Medal, MD;  Location: [facility];  Service: General;  Laterality: N/A;  . Appendectomy  03/14/11   Family History  Problem Relation Age of Onset  . Cancer Father     bone  . Cancer Sister     breast  . Heart disease Mother   . Hypertension Mother    History  Substance Use Topics  . Smoking status: Current Every Day Smoker -- 1.00 packs/day for 12 years  . Smokeless tobacco: Not on file     Comment: Smoking 1 ppd  . Alcohol Use: No   OB History    No data available     Review of Systems As in history of present illness Allergies  Amoxicillin; Erythromycin; Percocet; and Vicodin  Home Medications   Prior to Admission medications   Medication Sig Start Date  End Date Taking? Authorizing Provider  aspirin-acetaminophen-caffeine (EXCEDRIN MIGRAINE) [phone] MG per tablet Take 1-2 tablets by mouth every 8 (eight) hours as needed for headache.    Historical Provider, MD  benzocaine (ORAJEL) 10 % mucosal gel Use as directed 1 application in the mouth or throat as needed for mouth pain.    Historical Provider, MD  cyclobenzaprine (FLEXERIL) 10 MG tablet Take 1 tablet (10 mg total) by mouth 3 (three) times daily as needed for muscle spasms. Patient not taking: Reported on 05/10/2014 02/07/14   Lorayne Marek, MD  ferrous sulfate 325 (65 FE) MG tablet Take 1 tablet (325 mg total) by mouth 3 (three) times daily with meals. Patient not taking: Reported on 05/10/2014 02/07/14   Lorayne Marek, MD  gabapentin (NEURONTIN) 300 MG capsule Take 1 capsule (300 mg total) by mouth at bedtime. 09/10/14   Nicole Overly, MD  ibuprofen (ADVIL,MOTRIN) 800 MG tablet Take 1 tablet (800 mg total) by mouth every 8 (eight) hours as needed for headache or mild pain. Patient not taking: Reported on 05/10/2014 02/07/14   Lorayne Marek, MD  meloxicam (MOBIC) 15 MG tablet Take 1 tablet (15 mg total) by mouth daily. 05/08/14   Harden Mo, MD  nicotine (NICODERM CQ) 21 mg/24hr patch Place 1 patch (21 mg total) onto the skin daily. Patient not  taking: Reported on 05/10/2014 05/20/13   Lorayne Marek, MD  omeprazole (PRILOSEC) 20 MG capsule Take 1 capsule (20 mg total) by mouth daily. 06/14/14   Nicole Funches, MD  predniSONE (DELTASONE) 50 MG tablet Take 1 pill daily for 5 days. 09/10/14   Nicole Overly, MD  Vitamin D, Ergocalciferol, (DRISDOL) 50000 UNITS CAPS capsule Take 1 capsule (50,000 Units total) by mouth every 7 (seven) days. Patient not taking: Reported on 05/10/2014 02/07/14   Lorayne Marek, MD   BP 138/92 mmHg  Pulse 66  Temp(Src) 97.5 F (36.4 C) (Oral)  Resp 16  SpO2 100%  LMP 08/24/2014 Physical Exam  Constitutional: Nicole Cisneros is oriented to person, place, and time. Nicole Cisneros appears  well-developed and well-nourished. No distress.  Cardiovascular: Normal rate.   Pulmonary/Chest: Effort normal.  Musculoskeletal:  Neck: No vertebral tenderness or step-offs. Nicole Cisneros is tender along the right lateral neck and just above the spine of the scapula. Positive Spurling's on right. 5 out of 5 strength in grip and interosseous.  Neurological: Nicole Cisneros is alert and oriented to person, place, and time.    ED Course  Procedures (including critical care time) Labs Review Labs Reviewed - No data to display  Imaging Review No results found.   MDM   1. Right shoulder pain   2. Chronic low back pain    I suspect Nicole Cisneros may have some nerve root irritation secondary to cervical arthritis. Recommended gabapentin 3 mg at bedtime for the next 2 weeks. Nicole Cisneros states Nicole Cisneros had previously been on this medication for her knees without improvement. We'll treat with a five-day course of prednisone. Depo-Medrol 80 mg IM given here as Nicole Cisneros will not be able to get her medications until Tuesday. Follow-up with sports medicine as needed.    Nicole Overly, MD 09/10/14 1258

## 2014-09-10 NOTE — ED Notes (Signed)
Pt comes in with neck pain radiating to R shoulder that has been ongoing for months States she is taking Flexeril/Ibuprofen for pain Dry needle procedure done on Friday without relief  Constant, throb pain noted

## 2014-09-10 NOTE — Discharge Instructions (Signed)
I think your pain is coming from an irritated nerve in your neck. We gave you a shot of steroid today to help. Take gabapentin 1 pill at bedtime for the next 2 weeks. Take prednisone 1 pill daily for 5 days. Follow-up with sports medicine as needed.

## 2014-09-13 ENCOUNTER — Ambulatory Visit: Payer: No Typology Code available for payment source | Attending: Internal Medicine | Admitting: Internal Medicine

## 2014-09-13 ENCOUNTER — Ambulatory Visit (HOSPITAL_COMMUNITY)
Admission: RE | Admit: 2014-09-13 | Discharge: 2014-09-13 | Disposition: A | Discharge status: Home / Self Care | Payer: No Typology Code available for payment source | Source: Ambulatory Visit | Attending: Internal Medicine | Admitting: Internal Medicine

## 2014-09-13 ENCOUNTER — Encounter: Payer: Self-pay | Admitting: Internal Medicine

## 2014-09-13 ENCOUNTER — Telehealth: Payer: Self-pay | Admitting: Sports Medicine

## 2014-09-13 ENCOUNTER — Ambulatory Visit (HOSPITAL_COMMUNITY): Admission: RE | Admit: 2014-09-13 | Payer: Self-pay | Source: Ambulatory Visit | Admitting: Emergency Medicine

## 2014-09-13 VITALS — BP 126/80 | HR 96 | Temp 98.0°F | Resp 16 | Wt 132.6 lb

## 2014-09-13 DIAGNOSIS — M545 Low back pain, unspecified: Secondary | ICD-10-CM

## 2014-09-13 DIAGNOSIS — Z720 Tobacco use: Secondary | ICD-10-CM

## 2014-09-13 DIAGNOSIS — M2550 Pain in unspecified joint: Secondary | ICD-10-CM

## 2014-09-13 DIAGNOSIS — G8929 Other chronic pain: Secondary | ICD-10-CM | POA: Insufficient documentation

## 2014-09-13 DIAGNOSIS — F17200 Nicotine dependence, unspecified, uncomplicated: Secondary | ICD-10-CM

## 2014-09-13 DIAGNOSIS — M25511 Pain in right shoulder: Secondary | ICD-10-CM

## 2014-09-13 DIAGNOSIS — Z8261 Family history of arthritis: Secondary | ICD-10-CM

## 2014-09-13 DIAGNOSIS — F17210 Nicotine dependence, cigarettes, uncomplicated: Secondary | ICD-10-CM | POA: Insufficient documentation

## 2014-09-13 NOTE — Progress Notes (Signed)
MRN: [id number] Name: Nicole Cisneros  Sex: female Age: 44 y.o. DOB: Feb 23, 1971  Allergies: Amoxicillin; Erythromycin; Percocet; and Vicodin  Chief Complaint  Patient presents with  . Back Pain    HPI: Patient is 44 y.o. female who has to of chronic lower back pain, bilateral knee pain, recently went to the urgent care with the symptoms of right shoulder pain, EMR reviewed her patient was prescribed her prednisone and was advised to follow with sports medicine which she has scheduled appointment tomorrow, patient does report family history of rheumatoid arthritis and does report stiffness of hand joint, she denies any fever chills chest pain shortness of breath still smokes cigarettes, I have counseled patient to quit smoking  Past Medical History  Diagnosis Date  . Headache(784.0)   . Anemia   . Weight loss   . Sore throat   . Cough   . Constipation   . Weakness   . COPD (chronic obstructive pulmonary disease)   . Migraine     Past Surgical History  Procedure Laterality Date  . Tubal ligation    . Laparoscopic appendectomy  03/14/2011    Procedure: APPENDECTOMY LAPAROSCOPIC;  Surgeon: Shann Medal, MD;  Location: [facility];  Service: General;  Laterality: N/A;  . Appendectomy  03/14/11      Medication List       This list is accurate as of: 09/13/14  5:20 PM.  Always use your most recent med list.               aspirin-acetaminophen-caffeine 250-250-65 MG per tablet  Commonly known as:  EXCEDRIN MIGRAINE  Take 1-2 tablets by mouth every 8 (eight) hours as needed for headache.     benzocaine 10 % mucosal gel  Commonly known as:  ORAJEL  Use as directed 1 application in the mouth or throat as needed for mouth pain.     cyclobenzaprine 10 MG tablet  Commonly known as:  FLEXERIL  Take 1 tablet (10 mg total) by mouth 3 (three) times daily as needed for muscle spasms.     ferrous sulfate 325 (65 FE) MG tablet  Take 1 tablet (325 mg total) by mouth 3 (three) times  daily with meals.     gabapentin 300 MG capsule  Commonly known as:  NEURONTIN  Take 1 capsule (300 mg total) by mouth at bedtime.     ibuprofen 800 MG tablet  Commonly known as:  ADVIL,MOTRIN  Take 1 tablet (800 mg total) by mouth every 8 (eight) hours as needed for headache or mild pain.     meloxicam 15 MG tablet  Commonly known as:  MOBIC  Take 1 tablet (15 mg total) by mouth daily.     nicotine 21 mg/24hr patch  Commonly known as:  NICODERM CQ  Place 1 patch (21 mg total) onto the skin daily.     omeprazole 20 MG capsule  Commonly known as:  PRILOSEC  Take 1 capsule (20 mg total) by mouth daily.     predniSONE 50 MG tablet  Commonly known as:  DELTASONE  Take 1 pill daily for 5 days.     Vitamin D (Ergocalciferol) 50000 UNITS Caps capsule  Commonly known as:  DRISDOL  Take 1 capsule (50,000 Units total) by mouth every 7 (seven) days.        No orders of the defined types were placed in this encounter.    Immunization History  Administered Date(s) Administered  . Influenza,inj,Quad PF,36+ Mos  02/07/2014    Family History  Problem Relation Age of Onset  . Cancer Father     bone  . Cancer Sister     breast  . Heart disease Mother   . Hypertension Mother     History  Substance Use Topics  . Smoking status: Current Every Day Smoker -- 1.00 packs/day for 12 years  . Smokeless tobacco: Not on file     Comment: Smoking 1 ppd  . Alcohol Use: No    Review of Systems   As noted in HPI  Filed Vitals:   09/13/14 1644  BP: 126/80  Pulse: 96  Temp: 98 F (36.7 C)  Resp: 16    Physical Exam  Physical Exam  Constitutional: No distress.  Cardiovascular: Normal rate and regular rhythm.   Pulmonary/Chest: Breath sounds normal. No respiratory distress. She has no wheezes. She has no rales.  Musculoskeletal:  Right shoulder good range of motion, cross arm test positive, positive Neer's test .  Minimal lower lumbar paraspinal tenderness, equal strength  both lower extremities    CBC    Component Value Date/Time   WBC 6.0 05/20/2013 1522   RBC 3.87 05/20/2013 1522   HGB 9.5* 05/20/2013 1522   HCT 30.5* 05/20/2013 1522   PLT 275 05/20/2013 1522   MCV 78.8 05/20/2013 1522   LYMPHSABS 1.5 05/20/2013 1522   MONOABS 0.3 05/20/2013 1522   EOSABS 0.1 05/20/2013 1522   BASOSABS 0.0 05/20/2013 1522    CMP     Component Value Date/Time   NA 141 05/20/2013 1522   K 3.5 05/20/2013 1522   CL 106 05/20/2013 1522   CO2 27 05/20/2013 1522   GLUCOSE 79 05/20/2013 1522   BUN 9 05/20/2013 1522   CREATININE 0.64 05/20/2013 1522   CREATININE 0.66 03/12/2013 1432   CALCIUM 9.2 05/20/2013 1522   PROT 7.1 05/20/2013 1522   ALBUMIN 4.1 05/20/2013 1522   AST 13 05/20/2013 1522   ALT 8 05/20/2013 1522   ALKPHOS 47 05/20/2013 1522   BILITOT 0.3 05/20/2013 1522   GFRNONAA >89 05/20/2013 1522   GFRNONAA >90 03/12/2013 1432   GFRAA >89 05/20/2013 1522   GFRAA >90 03/12/2013 1432    Lab Results  Component Value Date/Time   CHOL 161 05/20/2013 03:22 PM    No results found for: HGBA1C  Lab Results  Component Value Date/Time   AST 13 05/20/2013 03:22 PM    Assessment and Plan  Chronic low back pain Patient takes pain medication when necessary  Right shoulder pain - Plan:  ?Arthritis versus impingement, will get baseline x-ray, patient has scheduled appointment with sports medicine tomorrow.  Smoking Also patient to quit smoking  Multiple joint pain - Plan: Rheumatoid factor, ANA, Cyclic citrul peptide antibody, IgG, Uric acid  Family history of rheumatoid arthritis - Plan: Rheumatoid factor, Cyclic citrul peptide antibody, IgG   Return in about 3 months (around 12/14/2014), or if symptoms worsen or fail to improve.   This note has been created with Surveyor, quantity. Any transcriptional errors are unintentional.    Lorayne Marek, MD

## 2014-09-13 NOTE — Progress Notes (Signed)
Patient here for follow up on her chronic back pain Also complains of bilateral knee pain but  Currently following up with ortho Patient was seen at the urgent care last week for the same issues Currently taking prednisone prescribed by the urgent care

## 2014-09-14 ENCOUNTER — Telehealth: Payer: Self-pay

## 2014-09-14 ENCOUNTER — Ambulatory Visit (HOSPITAL_COMMUNITY)
Admission: RE | Admit: 2014-09-14 | Discharge: 2014-09-14 | Disposition: A | Discharge status: Home / Self Care | Payer: No Typology Code available for payment source | Source: Ambulatory Visit | Attending: Sports Medicine | Admitting: Sports Medicine

## 2014-09-14 ENCOUNTER — Ambulatory Visit (INDEPENDENT_AMBULATORY_CARE_PROVIDER_SITE_OTHER): Payer: Self-pay | Admitting: Sports Medicine

## 2014-09-14 ENCOUNTER — Encounter: Payer: Self-pay | Admitting: Sports Medicine

## 2014-09-14 VITALS — BP 139/82 | HR 75 | Ht 63.0 in | Wt 132.0 lb

## 2014-09-14 DIAGNOSIS — M25519 Pain in unspecified shoulder: Secondary | ICD-10-CM | POA: Insufficient documentation

## 2014-09-14 DIAGNOSIS — M542 Cervicalgia: Secondary | ICD-10-CM

## 2014-09-14 DIAGNOSIS — M79601 Pain in right arm: Secondary | ICD-10-CM | POA: Insufficient documentation

## 2014-09-14 DIAGNOSIS — M25511 Pain in right shoulder: Secondary | ICD-10-CM

## 2014-09-14 LAB — RHEUMATOID FACTOR: Rhuematoid fact SerPl-aCnc: <10 IU/mL (ref <=14)

## 2014-09-14 LAB — ANA: Anti Nuclear Antibody(ANA): NEGATIVE

## 2014-09-14 LAB — URIC ACID: Uric Acid, Serum: 3.1 mg/dL (ref 2.4–7.0)

## 2014-09-14 MED ORDER — DULOXETINE HCL 20 MG PO CPEP: 20.0000 mg | ORAL_CAPSULE | Freq: Every day | ORAL | Status: DC

## 2014-09-14 NOTE — Telephone Encounter (Signed)
Spoke with patient and she is aware of blood work results And x ray results

## 2014-09-14 NOTE — Assessment & Plan Note (Signed)
R shoulder pain suspicious for cervical or upper thoracic etiology Eval neck with plain film Compounded on several other pain complaints, consider fibromyalgia No improvement with gabapentin, start cymblata Consider MRI but eval with plain film, wait for help from steroids, and give SNRI time to work before that with no red flag findings

## 2014-09-14 NOTE — Telephone Encounter (Signed)
-----   Message from Lorayne Marek, MD sent at 09/14/2014  2:20 PM EDT ----- Call and let the patient know that her blood work is normal

## 2014-09-14 NOTE — Progress Notes (Signed)
  Nicole Cisneros - 44 y.o. female MRN [id number]  Date of birth: 03/19/1971  SUBJECTIVE: Including CC, HPI, ROS HISTORY:  CC: Multiple problems: 1. Low back pain 2. Right neck and shoulder pain  HPI:  Reports 2 weeks of R shoulder pain with no inciting event or trauma. 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 She describes it as a burning R scapular pain that radaites to her R upper arm and Midline upper thoracic area and goes down her spine to her lumbar area. Also has midline and R sided cervical neck pain which radiates in to her upper arm.  The pain is helped slightly by prednisone, which she has been using for 2 days now.  It is helped by topical cream for approx 1 hour.   ROS:  No fevers, chills sweats + R shoulder, Low back, R neck, and R hip pain + Joint stiffness for greater than 1 hour in L foot and BL hands in am when walking, also at the end of the day Per HPI, otherwise negative    multiple allergies including oxycodone and hydrocodone History of migraines Medications reviewed currently on prednisone dose pack Prior history of vitamin D deficiency status post 50,000 units 12 weeks, no recheck Rheumatoid panel pending from office visit yesterday. No specialty comments available. Social History   Occupational History  . Not on file.   Social History Main Topics  . Smoking status: Current Every Day Smoker -- 1.00 packs/day for 12 years  . Smokeless tobacco: Not on file     Comment: Smoking 1 ppd  . Alcohol Use: No  . Drug Use: No  . Sexual Activity: Yes    Birth Control/ Protection: None      No problems updated.  OBJECTIVE: HT:5\' 3"  (160 cm) WT:132 lb (59.875 kg) BMI:23.4 BP:139/82 mmHg HR:75bpm TEMP: ( ) RESP:  PHYSICAL EXAM: GENERAL: Adult AAF. No acute distress PSYCH: Alert and appropriately interactive. SKIN: No open skin lesions or abnormal skin markings on areas inspected as  below VASCULAR: intact NEURO: Strnegth 5/5 and sensation intact IN BL Upper extremities including hands, elbow, shoulder, sensation altered on R lateral shoudler compaered to L, 2+ reflexes oN L compared to 1+ brachioradialis on R otherwise 2+.  MSK: Tenderness to palpation Of L shouder which is greatest at scapula, Full ROM with pain on abduction, pain with hawkins and empty can    ASSESSMENT & PLAN: See Patient instructions for additional information Problem List Items Addressed This Visit    Pain in joint, shoulder region - Primary    R shoulder pain suspicious for cervical or upper thoracic etiology Eval neck with plain film Compounded on several other pain complaints, consider fibromyalgia No improvement with gabapentin, start cymblata Consider MRI but eval with plain film, wait for help from steroids, and give SNRI time to work before that with no red flag findings        Relevant Medications   DULoxetine (CYMBALTA) 20 MG capsule   Other Relevant Orders   DG Cervical Spine 2 or 3 views (Completed)   DG Thoracic Spine 2 View (Completed)    Other Visit Diagnoses    Neck pain               FOLLOW UP:  No Follow-up on file.

## 2014-09-15 LAB — CYCLIC CITRUL PEPTIDE ANTIBODY, IGG: Cyclic Citrullin Peptide Ab: <2 U/mL (ref 0.0–5.0)

## 2014-09-20 ENCOUNTER — Ambulatory Visit: Payer: No Typology Code available for payment source | Admitting: Sports Medicine

## 2014-09-29 ENCOUNTER — Encounter: Payer: Self-pay | Admitting: Sports Medicine

## 2014-10-23 ENCOUNTER — Ambulatory Visit (INDEPENDENT_AMBULATORY_CARE_PROVIDER_SITE_OTHER): Payer: No Typology Code available for payment source | Admitting: Family Medicine

## 2014-10-23 ENCOUNTER — Encounter: Payer: Self-pay | Admitting: Family Medicine

## 2014-10-23 VITALS — BP 121/85 | Ht 63.0 in | Wt 130.0 lb

## 2014-10-23 DIAGNOSIS — M791 Myalgia: Secondary | ICD-10-CM

## 2014-10-23 DIAGNOSIS — M609 Myositis, unspecified: Secondary | ICD-10-CM

## 2014-10-23 DIAGNOSIS — IMO0001 Reserved for inherently not codable concepts without codable children: Secondary | ICD-10-CM | POA: Insufficient documentation

## 2014-10-23 MED ORDER — FE FUM-VIT C-VIT B12-FA 460-60-0.01-1 MG PO CAPS: 1.0000 | ORAL_CAPSULE | Freq: Every day | ORAL | Status: DC

## 2014-10-23 NOTE — Progress Notes (Signed)
Patient ID: Nicole Cisneros, female   DOB: 02-11-1971, 44 y.o.   MRN: [id number]  Nicole Cisneros - 44 y.o. female MRN [id number]  Date of birth: 1971/03/24    SUBJECTIVE:     Complaint of multiple areas of sharp shooting pain, particularly her legs, knees left greater than right. Also has shooting pains from her back. Yesterday had acute pain in her left lower back that radiated into her left buttock and left leg causing her to have to sit down suddenly. Did not having numbness or weakness in her leg and it self resolved. She has similar pains in her thighs and knees and occasionally has it in her right shoulder and neck. These have been going on for years but have been worse in the last 1-2 months. ROS:     No unusual weight change, fever, sweats, chills. She's had no unusual bruising and no bleeding from the gums when brushing her teeth. She has felt fatigued. She does have a lot of stress in her life. Denies chest pain, does have some shortness of breath with exertion but no chest pain with exertion. No lower extremity swelling. No palpitations. Admits to some depressed mood in stressors with mild anxiety intermittently but denies suicidal or homicidal ideation.  PERTINENT  PMH / PSH FH / / SH:  Past Medical, Surgical, Social, and Family History Reviewed & Updated in the EMR.  Pertinent findings include:  History of anemia Current smoker History of reflux  OBJECTIVE: BP 121/85 mmHg  Ht 5\' 3"  (1.6 m)  Wt 130 lb (58.968 kg)  BMI 23.03 kg/m2  Physical Exam:  Vital signs are reviewed. GEN.: Well-developed female no acute distress MSK. Full range of motion of all joints. KNEES: No specific tenderness medial or lateral joint line of the knees, there is no effusion noted. She has full range of motion flexion extension. Negative patellofemoral grind test. Ligamentously intact to varus and valgus stress. BACK: Can rise from a chair without assistance. It flexor strength 5 out of 5. Imaging: Reviewed  her prior MRI of her lumbar spine, prior knee film and prior shoulder film. None of these show any significant osteoarthritic changes. There are no bulging disks on her back MRI.  ASSESSMENT & PLAN:  See problem based charting & AVS for pt instructions.

## 2014-10-23 NOTE — Assessment & Plan Note (Addendum)
Very unclear why she is having multiple areas of myalgia. These do not seem to be related to her joints. I reviewed all of her imaging and there is no sign of osteoarthritis. I think her myalgias are result of multiple factors which I suspect include:  #1 chronic anemia. For this I will send in a different, more tolerable iron supplement.  #2 chronic smoking,  #3. Deconditioning. I recommended she start some type of activity program. Unfortunately with her multiple muscle aches and pains I suspect she has not been very active and that is probably the exactly the treatment she needs.  #4. I wonder if some of this is related to chronic stress and if she would benefit from SSRI-type medication. She was evidently recently given a prescription for Cymbalta but says she is not filled it because "I did not want to start a medicine when they did not even know what they were treating." Perhaps her PCP could have a more in depth discussion with her regarding these issues.  I have discussed all this in detail with her. Going to refer her back to her PCP for further evaluation and management.

## 2014-10-30 ENCOUNTER — Encounter: Payer: Self-pay | Admitting: Internal Medicine

## 2014-10-30 ENCOUNTER — Ambulatory Visit: Payer: No Typology Code available for payment source | Attending: Internal Medicine | Admitting: Internal Medicine

## 2014-10-30 VITALS — BP 118/76 | HR 74 | Temp 98.0°F | Resp 16 | Wt 132.8 lb

## 2014-10-30 DIAGNOSIS — J449 Chronic obstructive pulmonary disease, unspecified: Secondary | ICD-10-CM | POA: Insufficient documentation

## 2014-10-30 DIAGNOSIS — N926 Irregular menstruation, unspecified: Secondary | ICD-10-CM | POA: Insufficient documentation

## 2014-10-30 DIAGNOSIS — N921 Excessive and frequent menstruation with irregular cycle: Secondary | ICD-10-CM

## 2014-10-30 DIAGNOSIS — N920 Excessive and frequent menstruation with regular cycle: Secondary | ICD-10-CM | POA: Insufficient documentation

## 2014-10-30 DIAGNOSIS — F17210 Nicotine dependence, cigarettes, uncomplicated: Secondary | ICD-10-CM | POA: Insufficient documentation

## 2014-10-30 DIAGNOSIS — D649 Anemia, unspecified: Secondary | ICD-10-CM | POA: Insufficient documentation

## 2014-10-30 DIAGNOSIS — Z79899 Other long term (current) drug therapy: Secondary | ICD-10-CM | POA: Insufficient documentation

## 2014-10-30 LAB — ANEMIA PANEL
%SAT: 12 % — ABNORMAL LOW (ref 20–55)
ABS Retic: 48.2 10*3/uL (ref 19.0–186.0)
Ferritin: 8 ng/mL — ABNORMAL LOW (ref 10–291)
Folate: 10.9 ng/mL
Iron: 50 ug/dL (ref 42–145)
RBC.: 4.02 MIL/uL (ref 3.87–5.11)
Retic Ct Pct: 1.2 % (ref 0.4–2.3)
TIBC: 432 ug/dL (ref 250–470)
UIBC: 382 ug/dL (ref 125–400)
Vitamin B-12: 315 pg/mL (ref 211–911)

## 2014-10-30 LAB — CBC WITH DIFFERENTIAL/PLATELET
Basophils Absolute: 0 10*3/uL (ref 0.0–0.1)
Basophils Relative: 0 % (ref 0–1)
Eosinophils Absolute: 0.1 10*3/uL (ref 0.0–0.7)
Eosinophils Relative: 1 % (ref 0–5)
HCT: 33.7 % — ABNORMAL LOW (ref 36.0–46.0)
Hemoglobin: 11 g/dL — ABNORMAL LOW (ref 12.0–15.0)
Lymphocytes Relative: 18 % (ref 12–46)
Lymphs Abs: 1.4 10*3/uL (ref 0.7–4.0)
MCH: 27.4 pg (ref 26.0–34.0)
MCHC: 32.6 g/dL (ref 30.0–36.0)
MCV: 83.8 fL (ref 78.0–100.0)
MPV: 10.3 fL (ref 8.6–12.4)
Monocytes Absolute: 0.6 10*3/uL (ref 0.1–1.0)
Monocytes Relative: 8 % (ref 3–12)
Neutro Abs: 5.5 10*3/uL (ref 1.7–7.7)
Neutrophils Relative %: 73 % (ref 43–77)
Platelets: 277 10*3/uL (ref 150–400)
RBC: 4.02 MIL/uL (ref 3.87–5.11)
RDW: 17.7 % — ABNORMAL HIGH (ref 11.5–15.5)
WBC: 7.5 10*3/uL (ref 4.0–10.5)

## 2014-10-30 MED ORDER — FERROUS SULFATE 325 (65 FE) MG PO TABS: 325.0000 mg | ORAL_TABLET | Freq: Every day | ORAL | Status: DC

## 2014-10-30 NOTE — Progress Notes (Signed)
Patient here for follow up Patient was seen at sports medicine and told to follow up With her doctor on her anemia Patient feels sluggish at times Still having heavy periods And also  Having trouble sleeping at night

## 2014-10-30 NOTE — Progress Notes (Signed)
MRN: [id number] Name: Nicole Cisneros  Sex: female Age: 44 y.o. DOB: 21-Jan-1971  Allergies: Amoxicillin; Erythromycin; Percocet; and Vicodin  Chief Complaint  Patient presents with  . Follow-up    HPI: Patient is 44 y.o. female who has history of myalgia/arthritis following up with sports medicine , she does complain of feeling tired, the report history of menorrhagia recently she was prescribed iron supplements she has not started taking yet, currently denies any dizziness chest and shortness of breath, patient reports these heavy menstrual periods on and off for almost a year, patient has not been seen by gynecologist and also has not had a Pap smear done.  Past Medical History  Diagnosis Date  . Headache(784.0)   . Anemia   . Weight loss   . Sore throat   . Cough   . Constipation   . Weakness   . COPD (chronic obstructive pulmonary disease)   . Migraine     Past Surgical History  Procedure Laterality Date  . Tubal ligation    . Laparoscopic appendectomy  03/14/2011    Procedure: APPENDECTOMY LAPAROSCOPIC;  Surgeon: Shann Medal, MD;  Location: [facility];  Service: General;  Laterality: N/A;  . Appendectomy  03/14/11      Medication List       This list is accurate as of: 10/30/14 10:37 AM.  Always use your most recent med list.               benzocaine 10 % mucosal gel  Commonly known as:  ORAJEL  Use as directed 1 application in the mouth or throat as needed for mouth pain.     DULoxetine 20 MG capsule  Commonly known as:  CYMBALTA  Take 1 capsule (20 mg total) by mouth daily.     Fe Fum-Vit C-Vit B12-FA 460-60-0.01-1 MG Caps capsule  Commonly known as:  TRIGELS-F  Take 1 capsule by mouth daily.     ferrous sulfate 325 (65 FE) MG tablet  Take 1 tablet (325 mg total) by mouth daily with breakfast.     gabapentin 300 MG capsule  Commonly known as:  NEURONTIN  Take 1 capsule (300 mg total) by mouth at bedtime.     omeprazole 20 MG capsule  Commonly  known as:  PRILOSEC  Take 1 capsule (20 mg total) by mouth daily.        Meds ordered this encounter  Medications  . ferrous sulfate 325 (65 FE) MG tablet    Sig: Take 1 tablet (325 mg total) by mouth daily with breakfast.    Dispense:  30 tablet    Refill:  3    Immunization History  Administered Date(s) Administered  . Influenza,inj,Quad PF,36+ Mos 02/07/2014    Family History  Problem Relation Age of Onset  . Cancer Father     bone  . Cancer Sister     breast  . Heart disease Mother   . Hypertension Mother     History  Substance Use Topics  . Smoking status: Current Every Day Smoker -- 1.00 packs/day for 12 years  . Smokeless tobacco: Not on file     Comment: Smoking 1 ppd  . Alcohol Use: No    Review of Systems   As noted in HPI  Filed Vitals:   10/30/14 0939  BP: 118/76  Pulse: 74  Temp: 98 F (36.7 C)  Resp: 16    Physical Exam  Physical Exam  Constitutional: No distress.  Eyes:  EOM are normal. Pupils are equal, round, and reactive to light.  Cardiovascular: Normal rate and regular rhythm.   Pulmonary/Chest: Breath sounds normal. No respiratory distress. She has no wheezes. She has no rales.  Musculoskeletal: She exhibits no edema.    CBC    Component Value Date/Time   WBC 6.0 05/20/2013 1522   RBC 3.87 05/20/2013 1522   HGB 9.5* 05/20/2013 1522   HCT 30.5* 05/20/2013 1522   PLT 275 05/20/2013 1522   MCV 78.8 05/20/2013 1522   LYMPHSABS 1.5 05/20/2013 1522   MONOABS 0.3 05/20/2013 1522   EOSABS 0.1 05/20/2013 1522   BASOSABS 0.0 05/20/2013 1522    CMP     Component Value Date/Time   NA 141 05/20/2013 1522   K 3.5 05/20/2013 1522   CL 106 05/20/2013 1522   CO2 27 05/20/2013 1522   GLUCOSE 79 05/20/2013 1522   BUN 9 05/20/2013 1522   CREATININE 0.64 05/20/2013 1522   CREATININE 0.66 03/12/2013 1432   CALCIUM 9.2 05/20/2013 1522   PROT 7.1 05/20/2013 1522   ALBUMIN 4.1 05/20/2013 1522   AST 13 05/20/2013 1522   ALT 8  05/20/2013 1522   ALKPHOS 47 05/20/2013 1522   BILITOT 0.3 05/20/2013 1522   GFRNONAA >89 05/20/2013 1522   GFRNONAA >90 03/12/2013 1432   GFRAA >89 05/20/2013 1522   GFRAA >90 03/12/2013 1432    Lab Results  Component Value Date/Time   CHOL 161 05/20/2013 03:22 PM    No results found for: HGBA1C  Lab Results  Component Value Date/Time   AST 13 05/20/2013 03:22 PM    Assessment and Plan  Anemia, unspecified anemia type - Plan: we'll check Anemia panel, CBC with Differential/Platelet, also advised to start taking iron supplement 3 times a day.  Menorrhagia with irregular cycle - Plan:last TSH level was normal, Ambulatory referral to Gynecology    Return in about 3 months (around 01/30/2015), or if symptoms worsen or fail to improve.   This note has been created with Surveyor, quantity. Any transcriptional errors are unintentional.    Lorayne Marek, MD

## 2014-10-31 ENCOUNTER — Telehealth: Payer: Self-pay

## 2014-10-31 NOTE — Telephone Encounter (Signed)
Patient not available Left message on voice mail to return our call 

## 2014-10-31 NOTE — Telephone Encounter (Signed)
-----   Message from Lorayne Marek, MD sent at 10/31/2014 11:48 AM EDT ----- Call and let the patient know that her CBC shows improvement in hemoglobin level , she has low ferritin level patient has iron deficiency anemia, continue with iron supplement.

## 2014-11-01 ENCOUNTER — Telehealth: Payer: Self-pay

## 2014-11-01 NOTE — Telephone Encounter (Signed)
Returned patient phone call Patient not available Left message on voice mail to return our call 

## 2014-11-01 NOTE — Telephone Encounter (Signed)
Patient is returning phone call, please f/u °

## 2014-11-02 ENCOUNTER — Telehealth: Payer: Self-pay

## 2014-11-02 NOTE — Telephone Encounter (Signed)
Pt returning call

## 2014-11-02 NOTE — Telephone Encounter (Signed)
Returned patient phone call Patient not available Left message on voice mail to return our call 

## 2014-11-03 ENCOUNTER — Telehealth: Payer: Self-pay

## 2014-11-03 NOTE — Telephone Encounter (Signed)
Returned patient phone call and all is good She is aware to continue with iron supplements

## 2014-11-22 ENCOUNTER — Encounter: Payer: Self-pay | Admitting: Obstetrics & Gynecology

## 2014-11-27 ENCOUNTER — Ambulatory Visit: Payer: Self-pay

## 2014-11-29 ENCOUNTER — Telehealth: Payer: Self-pay | Admitting: Internal Medicine

## 2014-11-29 ENCOUNTER — Encounter (HOSPITAL_COMMUNITY): Payer: Self-pay | Admitting: Emergency Medicine

## 2014-11-29 ENCOUNTER — Emergency Department (HOSPITAL_COMMUNITY)
Admission: EM | Admit: 2014-11-29 | Discharge: 2014-11-29 | Disposition: A | Discharge status: Home / Self Care | Payer: Self-pay | Attending: Emergency Medicine | Admitting: Emergency Medicine

## 2014-11-29 DIAGNOSIS — Z9089 Acquired absence of other organs: Secondary | ICD-10-CM | POA: Insufficient documentation

## 2014-11-29 DIAGNOSIS — Z880 Allergy status to penicillin: Secondary | ICD-10-CM | POA: Insufficient documentation

## 2014-11-29 DIAGNOSIS — Z8709 Personal history of other diseases of the respiratory system: Secondary | ICD-10-CM | POA: Insufficient documentation

## 2014-11-29 DIAGNOSIS — Z720 Tobacco use: Secondary | ICD-10-CM | POA: Insufficient documentation

## 2014-11-29 DIAGNOSIS — G43909 Migraine, unspecified, not intractable, without status migrainosus: Secondary | ICD-10-CM | POA: Insufficient documentation

## 2014-11-29 DIAGNOSIS — Z79899 Other long term (current) drug therapy: Secondary | ICD-10-CM | POA: Insufficient documentation

## 2014-11-29 DIAGNOSIS — D649 Anemia, unspecified: Secondary | ICD-10-CM | POA: Insufficient documentation

## 2014-11-29 DIAGNOSIS — J449 Chronic obstructive pulmonary disease, unspecified: Secondary | ICD-10-CM | POA: Insufficient documentation

## 2014-11-29 DIAGNOSIS — Z8719 Personal history of other diseases of the digestive system: Secondary | ICD-10-CM | POA: Insufficient documentation

## 2014-11-29 DIAGNOSIS — K219 Gastro-esophageal reflux disease without esophagitis: Secondary | ICD-10-CM | POA: Insufficient documentation

## 2014-11-29 MED ORDER — GI COCKTAIL ~~LOC~~
30.0000 mL | Freq: Once | ORAL | Status: AC
Start: 2014-11-29 — End: 2014-11-29
  Administered 2014-11-29: 30 mL via ORAL
  Filled 2014-11-29: qty 30

## 2014-11-29 MED ORDER — ONDANSETRON 4 MG PO TBDP
4.0000 mg | ORAL_TABLET | Freq: Once | ORAL | Status: AC
  Administered 2014-11-29: 4 mg via ORAL
  Filled 2014-11-29: qty 1

## 2014-11-29 MED ORDER — OMEPRAZOLE 20 MG PO CPDR: 20.0000 mg | DELAYED_RELEASE_CAPSULE | Freq: Every day | ORAL | Status: DC

## 2014-11-29 NOTE — Telephone Encounter (Signed)
Patient called requesting a medication refill for Prilosec. Please follow up.

## 2014-11-29 NOTE — ED Notes (Signed)
Patient states having "really bad reflux.  I feel the acid going up into my throat.  It was so bad I couldn't sleep last night".  Patient states she normally takes prilosec at home, but she is out.  Patient staes needs to redo her orange card Friday also.

## 2014-11-29 NOTE — Discharge Instructions (Signed)
Gastroesophageal Reflux Disease, Adult Gastroesophageal reflux disease (GERD) happens when acid from your stomach flows up into the esophagus. When acid comes in contact with the esophagus, the acid causes soreness (inflammation) in the esophagus. Over time, GERD may create small holes (ulcers) in the lining of the esophagus. CAUSES   Increased body weight. This puts pressure on the stomach, making acid rise from the stomach into the esophagus.  Smoking. This increases acid production in the stomach.  Drinking alcohol. This causes decreased pressure in the lower esophageal sphincter (valve or ring of muscle between the esophagus and stomach), allowing acid from the stomach into the esophagus.  Late evening meals and a full stomach. This increases pressure and acid production in the stomach.  A malformed lower esophageal sphincter. Sometimes, no cause is found. SYMPTOMS   Burning pain in the lower part of the mid-chest behind the breastbone and in the mid-stomach area. This may occur twice a week or more often.  Trouble swallowing.  Sore throat.  Dry cough.  Asthma-like symptoms including chest tightness, shortness of breath, or wheezing. DIAGNOSIS  Your caregiver may be able to diagnose GERD based on your symptoms. In some cases, X-rays and other tests may be done to check for complications or to check the condition of your stomach and esophagus. TREATMENT  Your caregiver may recommend over-the-counter or prescription medicines to help decrease acid production. Ask your caregiver before starting or adding any new medicines.  HOME CARE INSTRUCTIONS   Change the factors that you can control. Ask your caregiver for guidance concerning weight loss, quitting smoking, and alcohol consumption.  Avoid foods and drinks that make your symptoms worse, such as:  Caffeine or alcoholic drinks.  Chocolate.  Peppermint or mint flavorings.  Garlic and onions.  Spicy foods.  Citrus fruits,  such as oranges, lemons, or limes.  Tomato-based foods such as sauce, chili, salsa, and pizza.  Fried and fatty foods.  Avoid lying down for the 3 hours prior to your bedtime or prior to taking a nap.  Eat small, frequent meals instead of large meals.  Wear loose-fitting clothing. Do not wear anything tight around your waist that causes pressure on your stomach.  Raise the head of your bed 6 to 8 inches with wood blocks to help you sleep. Extra pillows will not help.  Only take over-the-counter or prescription medicines for pain, discomfort, or fever as directed by your caregiver.  Do not take aspirin, ibuprofen, or other nonsteroidal anti-inflammatory drugs (NSAIDs). SEEK IMMEDIATE MEDICAL CARE IF:   You have pain in your arms, neck, jaw, teeth, or back.  Your pain increases or changes in intensity or duration.  You develop nausea, vomiting, or sweating (diaphoresis).  You develop shortness of breath, or you faint.  Your vomit is green, yellow, black, or looks like coffee grounds or blood.  Your stool is red, bloody, or black. These symptoms could be signs of other problems, such as heart disease, gastric bleeding, or esophageal bleeding. MAKE SURE YOU:   Understand these instructions.  Will watch your condition.  Will get help right away if you are not doing well or get worse. Document Released: 01/08/2005 Document Revised: 06/23/2011 Document Reviewed: 10/18/2010 [facility] Patient Information 2015 [place], [place]. This information is not intended to replace advice given to you by your health care provider. Make sure you discuss any questions you have with your health care provider.  Food Choices for Gastroesophageal Reflux Disease When you have gastroesophageal reflux disease (GERD), the foods  you eat and your eating habits are very important. Choosing the right foods can help ease your discomfort.  WHAT GUIDELINES DO I NEED TO FOLLOW?   Choose fruits, vegetables, whole  grains, and low-fat dairy products.   Choose low-fat meat, fish, and poultry.  Limit fats such as oils, salad dressings, butter, nuts, and avocado.   Keep a food diary. This helps you identify foods that cause symptoms.   Avoid foods that cause symptoms. These may be different for everyone.   Eat small meals often instead of 3 large meals a day.   Eat your meals slowly, in a place where you are relaxed.   Limit fried foods.   Cook foods using methods other than frying.   Avoid drinking alcohol.   Avoid drinking large amounts of liquids with your meals.   Avoid bending over or lying down until 2-3 hours after eating.  WHAT FOODS ARE NOT RECOMMENDED?  These are some foods and drinks that may make your symptoms worse: Vegetables Tomatoes. Tomato juice. Tomato and spaghetti sauce. Chili peppers. Onion and garlic. Horseradish. Fruits Oranges, grapefruit, and lemon (fruit and juice). Meats High-fat meats, fish, and poultry. This includes hot dogs, ribs, ham, sausage, salami, and bacon. Dairy Whole milk and chocolate milk. Sour cream. Cream. Butter. Ice cream. Cream cheese.  Drinks Coffee and tea. Bubbly (carbonated) drinks or energy drinks. Condiments Hot sauce. Barbecue sauce.  Sweets/Desserts Chocolate and cocoa. Donuts. Peppermint and spearmint. Fats and Oils High-fat foods. This includes [place] fries and potato chips. Other Vinegar. Strong spices. This includes black pepper, white pepper, red pepper, cayenne, curry powder, cloves, ginger, and chili powder. The items listed above may not be a complete list of foods and drinks to avoid. Contact your dietitian for more information. Document Released: 09/30/2011 Document Revised: 04/05/2013 Document Reviewed: 02/02/2013 [facility] Patient Information 2015 [place], [place]. This information is not intended to replace advice given to you by your health care provider. Make sure you discuss any questions you have with your  health care provider.

## 2014-11-29 NOTE — ED Provider Notes (Signed)
CSN: [id number]     Arrival date & time 11/29/14  0715 History   First MD Initiated Contact with Patient 11/29/14 [phone]     Chief Complaint  Patient presents with  . Gastrophageal Reflux     (Consider location/radiation/quality/duration/timing/severity/associated sxs/prior Treatment) HPI   44 year old female with history of GERD currently on Prilosec who presents for evaluation of throat irritation. Patient reports increased heartburn for the past several weeks. She was seen by her PCP  for this and was prescribed Prilosec which has helped. She however reports she ran out of her Prilosec. For the past several days she has noticed increasing moderate burning sensation from mid chest up to her throat. Symptoms intensified last night which kept her up intermittently throughout the night. She still endorsed the same burning sensation currently. She admits to eating spicy food last night and may aggravate her symptoms. She denies any associated fever, chills, lightheadedness, dizziness, shortness of breath, productive cough, hemoptysis, hematemesis, black or tarry stool. She denies persistent NSAIDs use. She also denies alcohol abuse. Family history of cardiac disease but patient without cardiac disease. She is a smoker. She did attempt to buy Prilosec at [facility] but states the medication did not provide the same relief as her prescribed omeprazole.      Past Medical History  Diagnosis Date  . Headache(784.0)   . Anemia   . Weight loss   . Sore throat   . Cough   . Constipation   . Weakness   . COPD (chronic obstructive pulmonary disease)   . Migraine    Past Surgical History  Procedure Laterality Date  . Tubal ligation    . Laparoscopic appendectomy  03/14/2011    Procedure: APPENDECTOMY LAPAROSCOPIC;  Surgeon: Shann Medal, MD;  Location: [place];  Service: General;  Laterality: N/A;  . Appendectomy  03/14/11   Family History  Problem Relation Age of Onset  . Cancer Father     bone   . Cancer Sister     breast  . Heart disease Mother   . Hypertension Mother    Social History  Substance Use Topics  . Smoking status: Current Every Day Smoker -- 1.00 packs/day for 12 years    Types: Cigarettes  . Smokeless tobacco: None     Comment: Smoking 1 ppd  . Alcohol Use: No   OB History    No data available     Review of Systems  All other systems reviewed and are negative.     Allergies  Amoxicillin; Erythromycin; Percocet; and Vicodin  Home Medications   Prior to Admission medications   Medication Sig Start Date End Date Taking? Authorizing Provider  benzocaine (ORAJEL) 10 % mucosal gel Use as directed 1 application in the mouth or throat as needed for mouth pain.    Historical Provider, MD  DULoxetine (CYMBALTA) 20 MG capsule Take 1 capsule (20 mg total) by mouth daily. 09/14/14   Gerda Diss, MD  Fe Fum-Vit C-Vit B12-FA (TRIGELS-F) 460-60-0.01-1 MG CAPS capsule Take 1 capsule by mouth daily. 10/23/14   Dickie La, MD  ferrous sulfate 325 (65 FE) MG tablet Take 1 tablet (325 mg total) by mouth daily with breakfast. 10/30/14   Lorayne Marek, MD  gabapentin (NEURONTIN) 300 MG capsule Take 1 capsule (300 mg total) by mouth at bedtime. 09/10/14   Melony Overly, MD  omeprazole (PRILOSEC) 20 MG capsule Take 1 capsule (20 mg total) by mouth daily. 06/14/14   Boykin Nearing, MD  BP 122/72 mmHg  Pulse 55  Temp(Src) 98 F (36.7 C) (Oral)  Resp 20  Ht 5\' 3"  (1.6 m)  Wt 130 lb (58.968 kg)  BMI 23.03 kg/m2  SpO2 99% Physical Exam  Constitutional: She appears well-developed and well-nourished. No distress.  HENT:  Head: Atraumatic.  Mouth/Throat: Oropharynx is clear and moist.  Eyes: Conjunctivae are normal.  Neck: Neck supple.  Cardiovascular: Normal rate and regular rhythm.   Pulmonary/Chest: Effort normal and breath sounds normal.  Abdominal: Soft. There is tenderness (very mild epigastric tenderness without guarding or rebound tenderness).  Neurological: She  is alert.  Skin: No rash noted.  Psychiatric: She has a normal mood and affect.  Nursing note and vitals reviewed.   ED Course  Procedures (including critical care time)  Patient presents with symptoms consistence with gastric reflux. I have low suspicion for ACS. Patient will be receiving a GI cocktail. I will prescribe Prilosec for symptomatic relief and patient will follow-up with PCP for further care. I recommend avoiding spicy foods, chocolate, mint, or eating late as it can aggravate her GERD.  8:11 AM Pt developed mild nausea after GI cocktail.  Zofran given.  Pt now report her heart burn sxs has improved and request to be discharge.  She is stable for discharge.  Return precaution given.  prilosec prescription written.  i instruct pt to take it 30 min before meal for maximal efficacy.    MDM   Final diagnoses:  Gastroesophageal reflux disease, esophagitis presence not specified    BP 122/72 mmHg  Pulse 55  Temp(Src) 98 F (36.7 C) (Oral)  Resp 20  Ht 5\' 3"  (1.6 m)  Wt 130 lb (58.968 kg)  BMI 23.03 kg/m2  SpO2 99%     Domenic Moras, PA-C 11/29/14 1103  Noemi Chapel, MD 11/29/14 2240

## 2014-11-29 NOTE — ED Notes (Signed)
Declined W/C at D/C and was escorted to lobby by RN. 

## 2014-12-01 ENCOUNTER — Ambulatory Visit: Payer: Self-pay

## 2014-12-01 ENCOUNTER — Ambulatory Visit: Payer: Self-pay | Attending: Family Medicine

## 2014-12-07 ENCOUNTER — Encounter: Payer: Self-pay | Admitting: Obstetrics & Gynecology

## 2014-12-07 ENCOUNTER — Ambulatory Visit (INDEPENDENT_AMBULATORY_CARE_PROVIDER_SITE_OTHER): Payer: Self-pay | Admitting: Obstetrics & Gynecology

## 2014-12-07 VITALS — BP 149/88 | HR 67 | Temp 97.7°F | Ht 63.0 in | Wt 133.6 lb

## 2014-12-07 DIAGNOSIS — N938 Other specified abnormal uterine and vaginal bleeding: Secondary | ICD-10-CM

## 2014-12-07 LAB — CBC
HCT: 31.9 % — ABNORMAL LOW (ref 36.0–46.0)
Hemoglobin: 10.3 g/dL — ABNORMAL LOW (ref 12.0–15.0)
MCH: 27.7 pg (ref 26.0–34.0)
MCHC: 32.3 g/dL (ref 30.0–36.0)
MCV: 85.8 fL (ref 78.0–100.0)
MPV: 10 fL (ref 8.6–12.4)
Platelets: 312 10*3/uL (ref 150–400)
RBC: 3.72 MIL/uL — ABNORMAL LOW (ref 3.87–5.11)
RDW: 15.8 % — ABNORMAL HIGH (ref 11.5–15.5)
WBC: 4.2 10*3/uL (ref 4.0–10.5)

## 2014-12-07 LAB — TSH: TSH: 1.931 u[IU]/mL (ref 0.350–4.500)

## 2014-12-07 NOTE — Patient Instructions (Signed)
Fibroids Fibroids are lumps (tumors) that can occur any place in a woman's body. These lumps are not cancerous. Fibroids vary in size, weight, and where they grow. HOME CARE  Do not take aspirin.  Write down the number of pads or tampons you use during your period. Tell your doctor. This can help determine the best treatment for you. GET HELP RIGHT AWAY IF:  You have pain in your lower belly (abdomen) that is not helped with medicine.  You have cramps that are not helped with medicine.  You have more bleeding between or during your period.  You feel lightheaded or pass out (faint).  Your lower belly pain gets worse. MAKE SURE YOU:  Understand these instructions.  Will watch your condition.  Will get help right away if you are not doing well or get worse. Document Released: 05/03/2010 Document Revised: 06/23/2011 Document Reviewed: 05/03/2010 [employer] Patient Information 2015 [employer]. This information is not intended to replace advice given to you by your health care provider. Make sure you discuss any questions you have with your health care provider.  

## 2014-12-07 NOTE — Progress Notes (Signed)
Pt reports having to wear two to three thick pads.  Also states that her periods last for weeks at a time and she has clots.  [place] scheduled for August 31st @ 0800 Free Pap Screening # given to pt to schedule; faxed Mammogram Scholarship application to Radiology

## 2014-12-07 NOTE — Progress Notes (Signed)
   Subjective:    Patient ID: Nicole Cisneros, female    DOB: June 03, 1970, 44 y.o.   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 She bleeds up to 3 weeks per month. She has not had a work up for this. She uses [facility] as her primary care. She tells me that she knows that she is anemic and takes iron daily.   Review of Systems She had a BTL in the 90s.    Objective:   Physical Exam [id number] Breathing, conversing, and ambulating normally Abd- benign EG, vagina, and cervix- normal Bimanual exam reveals a RV uterus suspicous for fibroids, normal adnexal exam       Assessment & Plan:  DUB- check CBC, TSH, gyn u/s She was given info on mammogram scholarship and the free pap clinic RTC 2 weeks

## 2014-12-13 ENCOUNTER — Ambulatory Visit (HOSPITAL_COMMUNITY)
Admission: RE | Admit: 2014-12-13 | Discharge: 2014-12-13 | Disposition: A | Discharge status: Home / Self Care | Payer: No Typology Code available for payment source | Source: Ambulatory Visit | Attending: Obstetrics & Gynecology | Admitting: Obstetrics & Gynecology

## 2014-12-13 DIAGNOSIS — N938 Other specified abnormal uterine and vaginal bleeding: Secondary | ICD-10-CM

## 2014-12-13 DIAGNOSIS — Z9851 Tubal ligation status: Secondary | ICD-10-CM | POA: Insufficient documentation

## 2014-12-19 ENCOUNTER — Other Ambulatory Visit (HOSPITAL_COMMUNITY): Payer: Self-pay | Admitting: Obstetrics & Gynecology

## 2014-12-19 DIAGNOSIS — Z1231 Encounter for screening mammogram for malignant neoplasm of breast: Secondary | ICD-10-CM

## 2014-12-20 ENCOUNTER — Ambulatory Visit (INDEPENDENT_AMBULATORY_CARE_PROVIDER_SITE_OTHER): Payer: Self-pay | Admitting: Obstetrics & Gynecology

## 2014-12-20 ENCOUNTER — Other Ambulatory Visit (HOSPITAL_COMMUNITY)
Admission: RE | Admit: 2014-12-20 | Discharge: 2014-12-20 | Disposition: A | Discharge status: Home / Self Care | Payer: No Typology Code available for payment source | Source: Ambulatory Visit | Attending: Obstetrics & Gynecology | Admitting: Obstetrics & Gynecology

## 2014-12-20 ENCOUNTER — Encounter: Payer: Self-pay | Admitting: Obstetrics & Gynecology

## 2014-12-20 VITALS — BP 132/89 | HR 67 | Temp 98.3°F | Ht 63.0 in | Wt 131.4 lb

## 2014-12-20 DIAGNOSIS — N938 Other specified abnormal uterine and vaginal bleeding: Secondary | ICD-10-CM

## 2014-12-20 DIAGNOSIS — N939 Abnormal uterine and vaginal bleeding, unspecified: Secondary | ICD-10-CM | POA: Insufficient documentation

## 2014-12-20 LAB — POCT PREGNANCY, URINE: Preg Test, Ur: NEGATIVE

## 2014-12-20 NOTE — Progress Notes (Signed)
   Subjective:    Patient ID: Nicole Cisneros, female    DOB: 01/20/1971, 44 y.o.   MRN: [id number]  HPI This 43 yo AA lady is here for a EMBX. Her u/s and TSH were normal; her HBG was 10.7.   Review of Systems     Objective:   Physical Exam  UPT negative, consent signed, time out done Cervix prepped with betadine and grasped with a single tooth tenaculum Uterus sounded to 8 cm Pipelle used for 2 passes with a moderate amount of tissue obtained. She tolerated the procedure well.      Assessment & Plan:  DUB- await EMBX If it is normal as I suspect then she will get a Mirena IUD. She is filling out the scholarship application today.  She will get her mammogram next week.

## 2014-12-28 ENCOUNTER — Ambulatory Visit (HOSPITAL_COMMUNITY)
Admission: RE | Admit: 2014-12-28 | Discharge: 2014-12-28 | Disposition: A | Discharge status: Home / Self Care | Payer: No Typology Code available for payment source | Source: Ambulatory Visit | Attending: Obstetrics & Gynecology | Admitting: Obstetrics & Gynecology

## 2014-12-28 DIAGNOSIS — Z1231 Encounter for screening mammogram for malignant neoplasm of breast: Secondary | ICD-10-CM

## 2015-01-10 ENCOUNTER — Telehealth: Payer: Self-pay | Admitting: Internal Medicine

## 2015-01-10 NOTE — Telephone Encounter (Signed)
Patient called requesting her mammogram results. Please f/u with pt.

## 2015-02-08 IMAGING — CR DG ABDOMEN ACUTE W/ 1V CHEST
3 series · 3 of 3 positions shown · non-contrast
Comparison: 03/14/2011 CT

CLINICAL DATA: Abdominal pain, shortness of breath, chest pain and
nausea.

EXAM:
ACUTE ABDOMEN SERIES (ABDOMEN 2 VIEW & CHEST 1 VIEW)

[w chest pa]
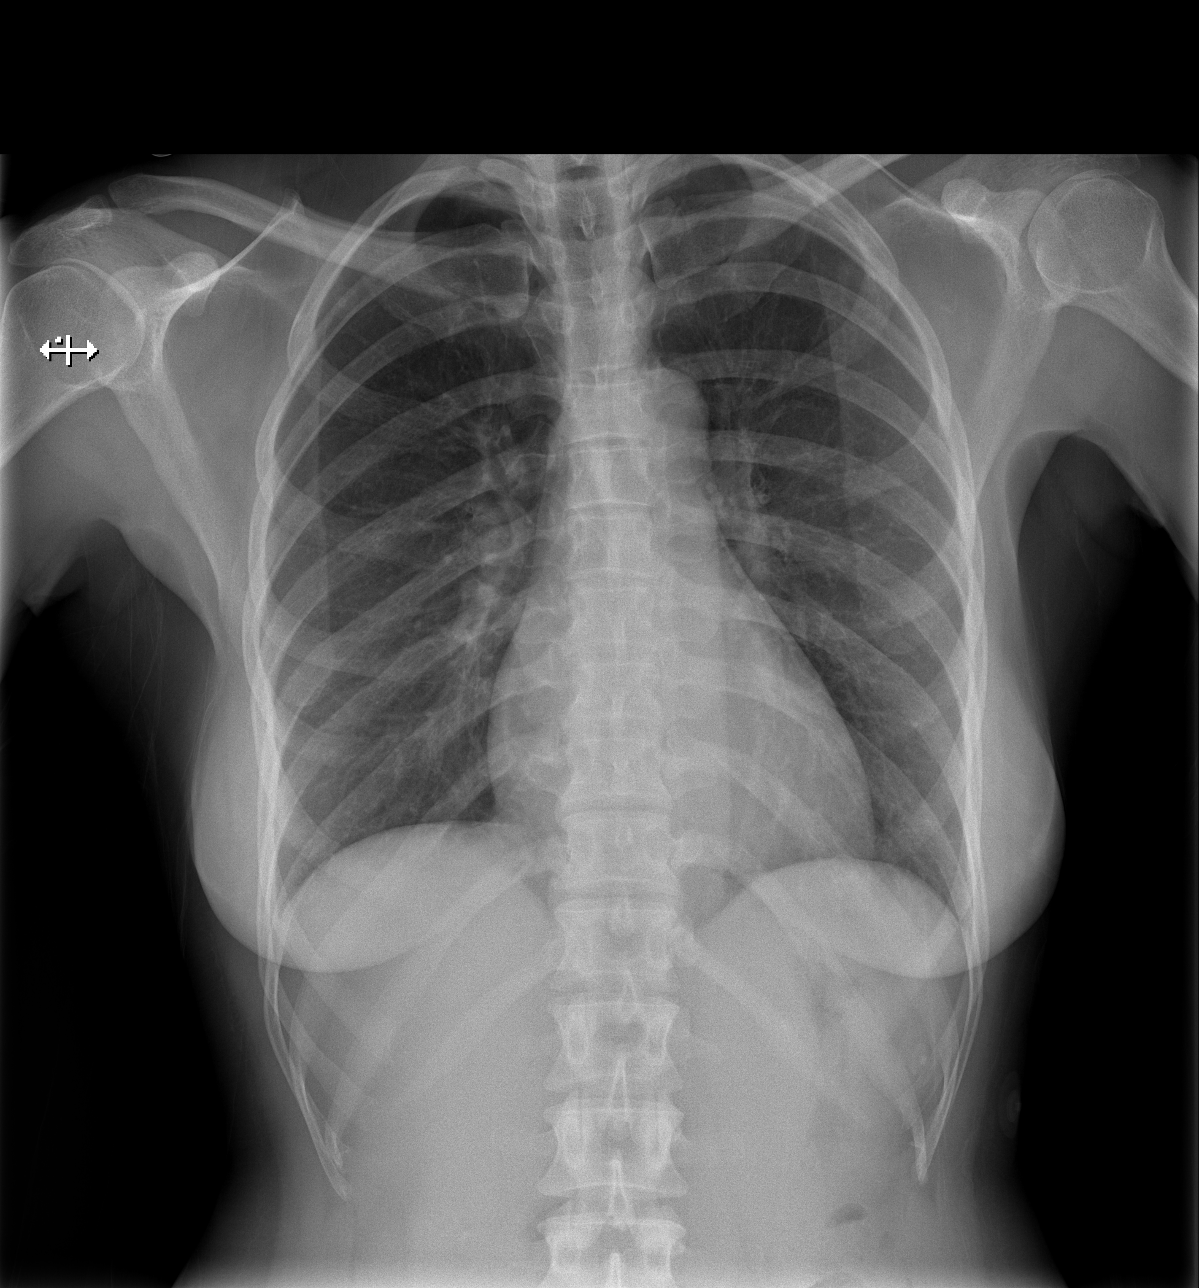

[w abdomen upright]
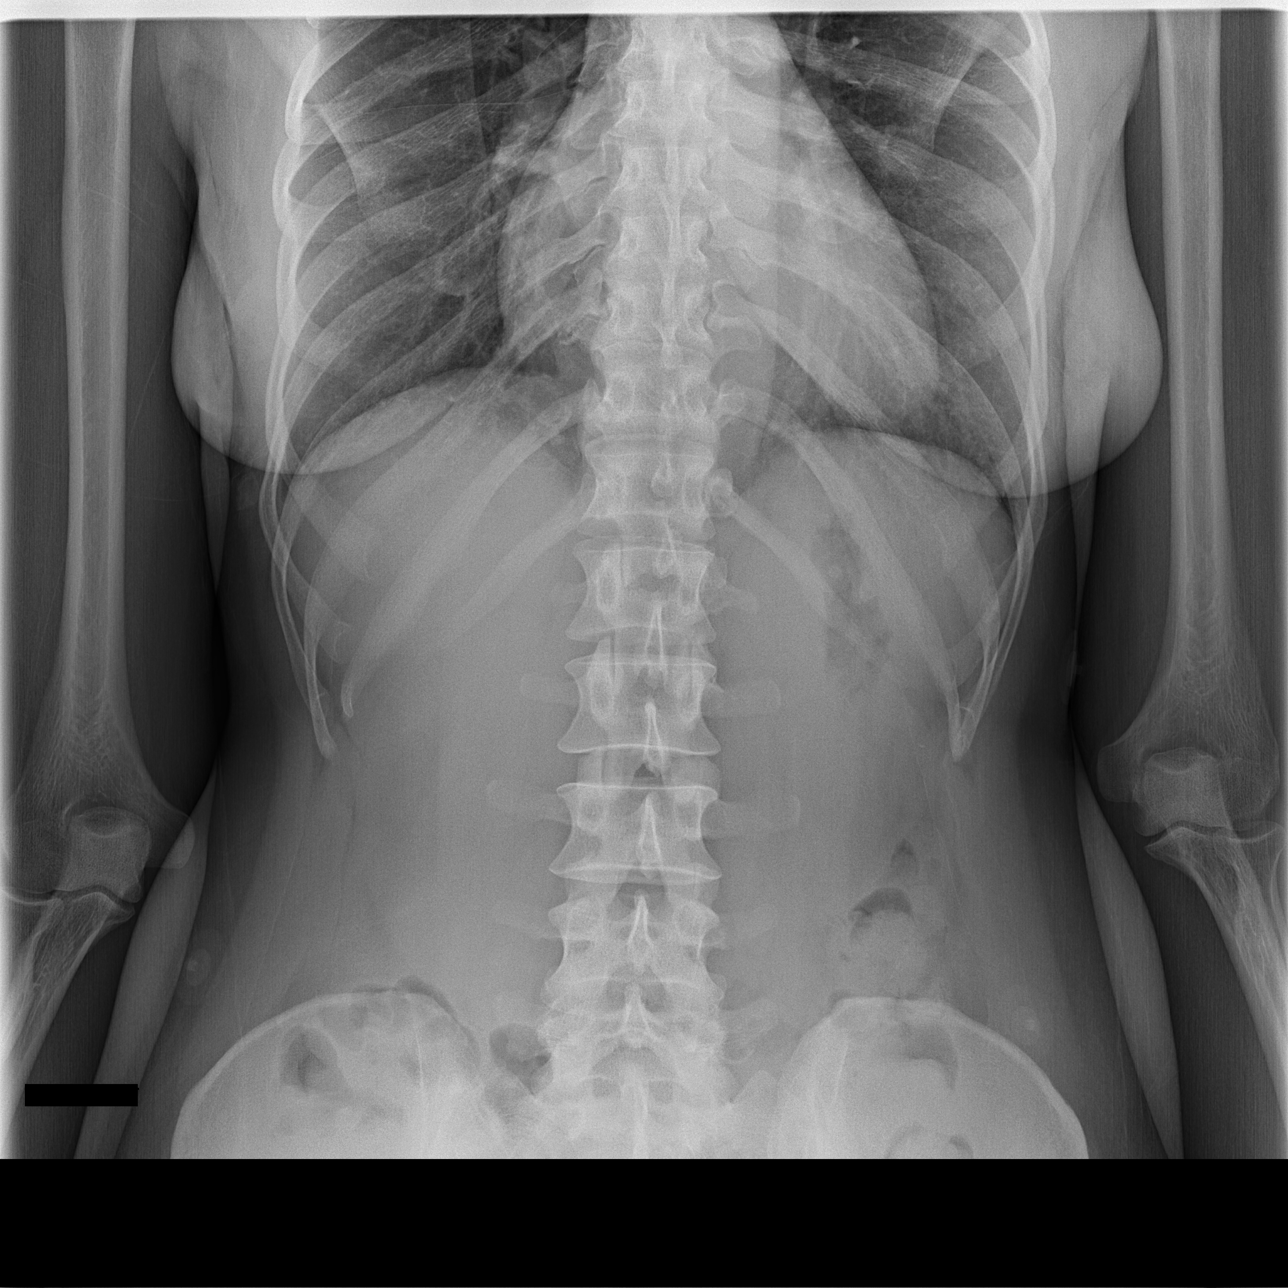

[t abdomen supine]
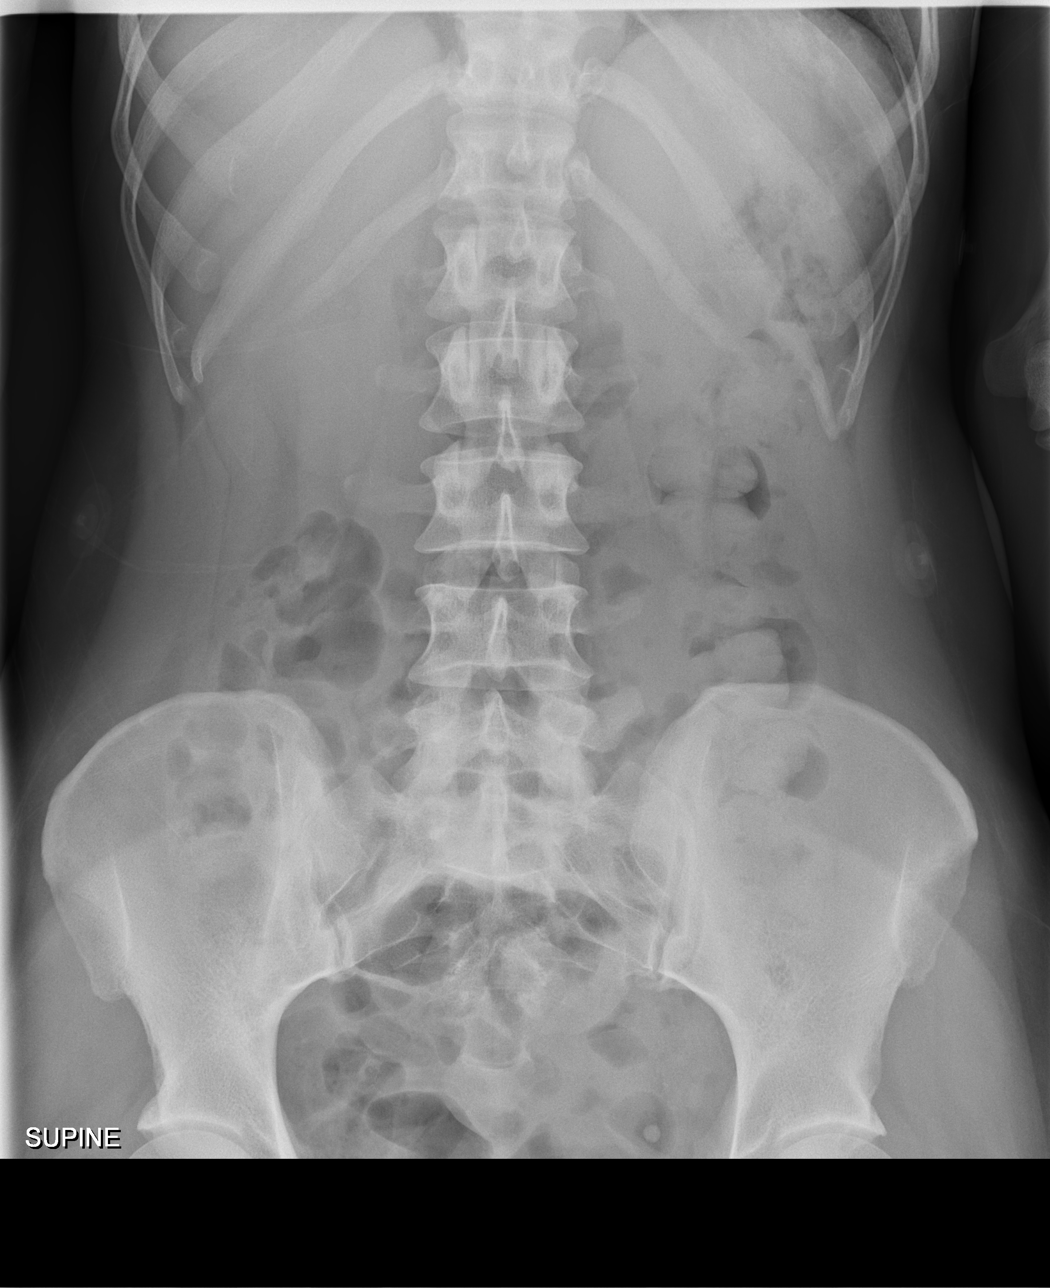

[3 of 3 positions shown; findings below may reference images not displayed]

FINDINGS: The cardiomediastinal silhouette is unremarkable.

There is no evidence of focal airspace disease, pulmonary edema,
suspicious pulmonary nodule/mass, pleural effusion, or pneumothorax.

The bowel gas pattern is unremarkable.

There is no bowel obstruction or pneumoperitoneum.

No suspicious is calcifications are identified.

No acute or suspicious bony abnormalities are noted.
IMPRESSION: Negative abdominal radiographs.  No acute cardiopulmonary disease.

## 2015-04-27 ENCOUNTER — Ambulatory Visit: Payer: No Typology Code available for payment source | Admitting: Family Medicine

## 2015-05-21 ENCOUNTER — Emergency Department (HOSPITAL_COMMUNITY)
Admission: EM | Admit: 2015-05-21 | Discharge: 2015-05-21 | Disposition: A | Discharge status: Home / Self Care | Payer: No Typology Code available for payment source | Attending: Emergency Medicine | Admitting: Emergency Medicine

## 2015-05-21 ENCOUNTER — Encounter (HOSPITAL_COMMUNITY): Payer: Self-pay | Admitting: Cardiology

## 2015-05-21 DIAGNOSIS — Z79899 Other long term (current) drug therapy: Secondary | ICD-10-CM | POA: Insufficient documentation

## 2015-05-21 DIAGNOSIS — F17210 Nicotine dependence, cigarettes, uncomplicated: Secondary | ICD-10-CM | POA: Insufficient documentation

## 2015-05-21 DIAGNOSIS — Z880 Allergy status to penicillin: Secondary | ICD-10-CM | POA: Insufficient documentation

## 2015-05-21 DIAGNOSIS — R21 Rash and other nonspecific skin eruption: Secondary | ICD-10-CM | POA: Insufficient documentation

## 2015-05-21 DIAGNOSIS — J449 Chronic obstructive pulmonary disease, unspecified: Secondary | ICD-10-CM | POA: Insufficient documentation

## 2015-05-21 DIAGNOSIS — D649 Anemia, unspecified: Secondary | ICD-10-CM | POA: Insufficient documentation

## 2015-05-21 DIAGNOSIS — G43909 Migraine, unspecified, not intractable, without status migrainosus: Secondary | ICD-10-CM | POA: Insufficient documentation

## 2015-05-21 MED ORDER — DIPHENHYDRAMINE HCL 25 MG PO CAPS
25.0000 mg | ORAL_CAPSULE | Freq: Once | ORAL | Status: AC
  Administered 2015-05-21: 25 mg via ORAL
  Filled 2015-05-21: qty 1

## 2015-05-21 NOTE — ED Notes (Signed)
Declined W/C at D/C and was escorted to lobby by RN. 

## 2015-05-21 NOTE — Discharge Instructions (Signed)
1. Medications: benadryl for itching, over the counter anti-itch cream, usual home medications 2. Treatment: rest, drink plenty of fluids, wash body and clothes you wear to work 3. Follow Up: please followup with your primary doctor for discussion of your diagnoses and further evaluation after today's visit; if you do not have a primary care doctor use the resource guide provided to find one; please return to the ER for new or worsening symptoms   Emergency Department Resource Guide 1) Find a Doctor and Pay Out of Pocket Although you won't have to find out who is covered by your insurance plan, it is a good idea to ask around and get recommendations. You will then need to call the office and see if the doctor you have chosen will accept you as a new patient and what types of options they offer for patients who are self-pay. Some doctors offer discounts or will set up payment plans for their patients who do not have insurance, but you will need to ask so you aren't surprised when you get to your appointment.  2) Contact Your Local Health Department Not all health departments have doctors that can see patients for sick visits, but many do, so it is worth a call to see if yours does. If you don't know where your local health department is, you can check in your phone book. The CDC also has a tool to help you locate your state's health department, and many state websites also have listings of all of their local health departments.  3) Find a [facility] If your illness is not likely to be very severe or complicated, you may want to try a walk in clinic. These are popping up all over the country in pharmacies, drugstores, and shopping centers. They're usually staffed by nurse practitioners or physician assistants that have been trained to treat common illnesses and complaints. They're usually fairly quick and inexpensive. However, if you have serious medical issues or chronic medical problems, these are  probably not your best option.  No Primary Care Doctor: - Call Health Connect at  [phone] - they can help you locate a primary care doctor that  accepts your insurance, provides certain services, etc. - Physician Referral Service- [phone]  Chronic Pain Problems: Organization         Address  Phone   Notes  [facility]  [phone] Patients need to be referred by their primary care doctor.   Medication Assistance: Organization         Address  Phone   Notes  [facility] Medication [facility] [address] [place], [place] [address] [phone] --Must be a resident of [facility] -- Must have NO insurance coverage whatsoever (no Medicaid/ Medicare, etc.) -- The pt. MUST have a primary care doctor that directs their care regularly and follows them in the community   MedAssist  [phone]   [employer]  [phone]    Agencies that provide inexpensive medical care: Organization         Address  Phone   Notes  [place]  [phone]   [facility]    [phone]   [facility] [place], [place] [address] [phone]   [place] [address] [place] [phone]   Planned Parenthood    [phone]   [facility]    [phone]  Community Health and [place]  [address], [place] Phone:  [phone], Fax:  [phone] Hours of Operation:  9 am - 6 pm, M-F.  Also accepts Medicaid/Medicare and self-pay.  [facility] for [place], [address], [place] Phone: [phone], Fax: [phone]. Hours of Operation:  8:30 am - 5:30 pm, M-F.  Also accepts Medicaid and self-pay.  [facility] [place] [address] [place] Phone: [phone]   [place], [place], [place] [phone], Ext. [phone] Mondays & Thursdays:  7-9 AM.  First 15 patients are seen on a first come, first serve basis.    [place] Providers:  Organization         Address  Phone   Notes  [facility] [address],  [phone] Also accepts self-pay patients.  [facility] [address], [place]  [phone]   [place], [address], [place] [phone]   [facility] [address] [place] [phone]   Lucianne Lei [address], [place]   [phone] Only accepts [place] Access [place] patients after they have their name applied to their card.   Self-Pay (no insurance) in [facility]:  Organization         Address  Phone   Notes  Sickle Cell Patients, [facility] [place] [phone]   [facility] [place] [phone]   [facility] [place]  [place], [place], [place] [phone]   [facility]/Dr. Osei-Bonsu  [address], [place] or [address], [place] [phone] Phone number for both [place] and [place] locations is the same.  Urgent Medical and [facility] [address], [place] [phone]   [facility] [address] [place] or [address] [phone]   [facility] [address], [place] [phone], phone; [phone], fax Sees patients 1st and 3rd Saturday of every month.  Must not qualify for public or private insurance (i.e. Medicaid, Medicare, [place] Health Choice, Veterans' Benefits)  Household income should be no more than 200% of the poverty level The clinic cannot treat you if you are pregnant or think you are pregnant  Sexually transmitted diseases are not treated at the clinic.     Dental Care: Organization         Address  Phone  Notes  [facility] [place] [phone] Accepts children up to age 46 who are enrolled in [place] or [place]; pregnant women with a Medicaid card; and children who have applied for Medicaid or [place] Health Choice, but were declined, whose parents can pay a reduced fee at time of service.  [facility] Department of [facility]  [address], [place] [phone] Accepts children up to age 58 who are enrolled in [place] or [place]; pregnant women with a Medicaid card; and children who have applied for Medicaid or [place] Health Choice, but were declined, whose parents can pay a reduced fee at time of service.  [place]  [phone]  [place] [phone] Patients are seen by appointment only. Walk-ins are not accepted. [place] will see patients 50 years of age and older. Monday - Tuesday (8am-5pm) Most Wednesdays (8:30-5pm) $30 per visit, cash only  [facility] Adult Dental Access PROGRAM  [address], [facility] [phone] Patients are seen by appointment only. Walk-ins are not accepted. [place] will see patients 104 years of age and older. One Wednesday Evening (Monthly: Volunteer Based).  $30 per visit, cash only  [place]  [phone] for adults; Children under age 13, call Graduate Pediatric Dentistry at [phone]. Children aged 62-14, please call [phone] to request a pediatric application.  Dental services are provided in all areas of dental care including fillings, crowns and bridges, complete and partial dentures, implants, gum treatment, root canals, and extractions. Preventive care is also provided. Treatment is provided to both adults and children. Patients are selected via a lottery and there is often a waiting  list.   [facility] [address] [place]  [phone] [email or web address]   [facility] [address] [place], [place] [phone], Ext. [phone] Second and Fourth Thursday of each month, opens at 6:30 AM; Clinic ends at 9 AM.  Patients are seen on a first-come first-served basis, and a limited number are seen during each clinic.   [facility]  [address] [place], [place] [phone]   Eligibility Requirements You must have lived in [place], [place], or [place] counties for at least the last three months.   You cannot be eligible for state or federal sponsored [employer], including [employer], [place], or [employer].   You generally cannot be eligible for healthcare insurance through your employer.    How to apply: Eligibility screenings are held every Tuesday and Wednesday afternoon from 1:00 pm until 4:00 pm. You do not need an appointment for the interview!  [facility] [address], [place], [place]   [place]  [place] Department  [place]  [phone]    Behavioral Health Resources in the Community: Intensive Outpatient Programs Organization         Address  Phone  Notes  [place]. [address] [place], [place] [phone]   [facility] Outpatient [address] [place], [place]   ADS: Alcohol & Drug Svcs [address], [place], [place]   [place] [address],  [place], [place] or [phone]   Substance Abuse Resources Organization         Address  Phone  Notes  Alcohol and Drug Services  [phone]   [place]  [phone]   The [place]   Chinita Pester  [phone]   Residential & Outpatient Substance Abuse Program   [phone]   Psychological Services Organization         Address  Phone  Notes  [facility] [place]  [place]  [phone]   [place] [address] [place] or [phone]    Mobile Crisis Teams Organization         Address  Phone  Notes  [facility], Mobile Crisis Care Unit  [phone]   [facility]  [address] [place], [place]  [facility] [address] [place] [phone]    Self-Help/Support Groups Organization         Address  Phone             Notes  Mental Health Assoc. of [place] - variety of support groups  [place] Call for more information  Narcotics Anonymous (NA), [facility] [address], [employer] [place]  2 meetings at this location   Special educational needs teacher         Address  Phone  Notes  [facility]idential Treatment [place],    [place]  [phone]   [facility]  [address], [place] [address], [place], [place]   [place], [place] [phone] Admissions: 8am-3pm M-F  Incentives Substance [place] [address]    [place], [place] [phone]   [facility] [address] [place], [place], [place]   [facility] [address],  [place], [place]   Insight Programs - Intensive Outpatient [address], [place], [place]   [facility] ([place].,  [place], [place] [phone] or [phone]   [facility] (RTS) [address], [place], [place] Accepts Medicaid  [facility] [place] [address]  [place] 1-2672603702 Substance Abuse/Addiction Treatment   [facility] Organization          Address  Phone  Notes  [facility]  [phone]   Domenic Schwab, PhD [address] [place], [place]   [phone] or [phone]   [place], [place] [phone]   [facility] [phone], [place], [place] [phone] Insurance/Medicaid/sponsorship through [facility] and Families [address] [place]                                    [place], [place] [phone] [place] [address], [place] [phone]    Dr. Adele Schilder  [phone]   [facility] Dept. 1) [address] [place] 2) [place] 3)  [place] [address], [place] [phone]  [phone]   [place] [phone] or [phone] (After Hours)

## 2015-05-21 NOTE — ED Provider Notes (Signed)
CSN: [id number]     Arrival date & time 05/21/15  1024 History   By signing my name below, I, Nicole Kindred, attest that this documentation has been prepared under the direction and in the presence of Enos Muhl C. Irvin Bastin, PA-C.  Electronically Signed: Nicole Kindred, ED Scribe 05/21/2015 at 11:21 AM.  Chief Complaint  Patient presents with  . Rash    The history is provided by the patient. No language interpreter was used.   HPI Comments: Nicole Cisneros is a 45 y.o. female who presents to the Emergency Department complaining of gradual onset, constant, pruritic rash on her upper back, onset about a month. Pt states that itching is made worse when she takes a shower but denies that she has used any new lotions, shampoos, or soaps. 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 She reports that she had a similar rash on her arms and legs that have healed with time. She has used alcohol, benadryl, and bandaids to cover the area with minimal relief. No other alleviating or worsening factors were noted. Pt denies any other pertinent symptoms.     Past Medical History  Diagnosis Date  . Headache(784.0)   . Anemia   . Weight loss   . Sore throat   . Cough   . Constipation   . Weakness   . COPD (chronic obstructive pulmonary disease) ([place])   . Migraine    Past Surgical History  Procedure Laterality Date  . Tubal ligation    . Laparoscopic appendectomy  03/14/2011    Procedure: APPENDECTOMY LAPAROSCOPIC;  Surgeon: Shann Medal, MD;  Location: [facility];  Service: General;  Laterality: N/A;  . Appendectomy  03/14/11   Family History  Problem Relation Age of Onset  . Cancer Father     bone  . Cancer Sister     breast  . Heart disease Mother   . Hypertension Mother    Social History  Substance Use Topics  . Smoking status: Current Every Day Smoker -- 1.00 packs/day for 12 years    Types: Cigarettes  . Smokeless tobacco: None     Comment: Smoking 1 ppd   . Alcohol Use: No   OB History    No data available      Review of Systems  Constitutional: Negative for fever, activity change and fatigue.  Skin: Positive for rash.  Neurological: Negative for headaches.      Allergies  Amoxicillin; Erythromycin; Percocet; and Vicodin  Home Medications   Prior to Admission medications   Medication Sig Start Date End Date Taking? Authorizing Provider  benzocaine (ORAJEL) 10 % mucosal gel Use as directed 1 application in the mouth or throat as needed for mouth pain.    Historical Provider, MD  DULoxetine (CYMBALTA) 20 MG capsule Take 1 capsule (20 mg total) by mouth daily. Patient not taking: Reported on 12/07/2014 09/14/14   Gerda Diss, DO  Fe Fum-Vit C-Vit B12-FA (TRIGELS-F) 460-60-0.01-1 MG CAPS capsule Take 1 capsule by mouth daily. Patient not taking: Reported on 12/07/2014 10/23/14   Dickie La, MD  ferrous sulfate 325 (65 FE) MG tablet Take 1 tablet (325 mg total) by mouth daily with breakfast. Patient not taking: Reported on 12/20/2014 10/30/14   Lorayne Marek, MD  gabapentin (NEURONTIN) 300 MG capsule Take 1 capsule (300 mg total) by mouth at bedtime. Patient not taking: Reported on 12/20/2014 09/10/14   Melony Overly, MD  omeprazole [facility])  20 MG capsule Take 1 capsule (20 mg total) by mouth daily. 11/29/14   Domenic Moras, PA-C    BP 144/98 mmHg  Pulse 79  Temp(Src) 98.6 F (37 C) (Oral)  Resp 18  Wt 131 lb (59.421 kg)  SpO2 99%  LMP 05/07/2015 Physical Exam  Constitutional: She is oriented to person, place, and time. She appears well-developed and well-nourished. No distress.  HENT:  Head: Normocephalic and atraumatic.  Right Ear: External ear normal.  Left Ear: External ear normal.  Nose: Nose normal.  Mouth/Throat: Oropharynx is clear and moist.  Eyes: Conjunctivae and EOM are normal. Pupils are equal, round, and reactive to light. Right eye exhibits no discharge. Left eye exhibits no discharge. No scleral icterus.  Neck:  Normal range of motion. Neck supple.  Cardiovascular: Normal rate, regular rhythm and intact distal pulses.   Pulmonary/Chest: Effort normal and breath sounds normal. No respiratory distress.  Musculoskeletal: Normal range of motion. She exhibits no edema or tenderness.  Neurological: She is alert and oriented to person, place, and time.  Skin: Skin is warm and dry. Rash noted. She is not diaphoretic. No erythema.  Multiple lesions to upper back that appear consistent with bug bites. Diffuse excoriations to upper back.   Psychiatric: She has a normal mood and affect. Her behavior is normal.  Nursing note and vitals reviewed.   ED Course  Procedures (including critical care time)  DIAGNOSTIC STUDIES: Oxygen Saturation is 99% on RA, normal by my interpretation.    COORDINATION OF CARE: 11:19 AM-Discussed treatment plan which includes continued use of benadryl and anti-itch cream with pt at bedside and pt agreed to plan.   Labs Review Labs Reviewed - No data to display  Imaging Review No results found.     EKG Interpretation None      MDM   Final diagnoses:  Rash and nonspecific skin eruption    45 year old female presents with rash to upper back. Reports associated itching. Patient is afebrile. Vital signs stable. On exam, patient has several lesions to her upper back that appear consistent with bug bites. She also has diffuse excoriation to her upper back. No erythema or discharge. No signs of infection. Patient is non-toxic and well-appearing, feel she is stable for discharge at this time. Advised to continue benadryl use. Also recommended OTC itch cream and washing all of her work clothes given possibility of exposure to bed bugs. Patient to follow-up with PCP for further evaluation and management. Return precautions discussed. Patient verbalizes her understanding and is in agreement with plan.  BP 144/98 mmHg  Pulse 79  Temp(Src) 98.6 F (37 C) (Oral)  Resp 18  Wt  59.421 kg  SpO2 99%  LMP 05/07/2015  I personally performed the services described in this documentation, which was scribed in my presence. The recorded information has been reviewed and is accurate.    Marella Chimes, PA-C 05/21/15 1446  Virgel Manifold, MD 05/22/15 [phone]

## 2015-05-21 NOTE — ED Notes (Signed)
Pt reports a rash to the upper back, that has been there a couple of weeks. States she is scratching the area and it makes it worse.

## 2016-05-05 ENCOUNTER — Encounter (HOSPITAL_COMMUNITY): Payer: Self-pay | Admitting: Emergency Medicine

## 2016-05-05 ENCOUNTER — Emergency Department (HOSPITAL_COMMUNITY)
Admission: EM | Admit: 2016-05-05 | Discharge: 2016-05-05 | Disposition: A | Discharge status: Home / Self Care | Payer: No Typology Code available for payment source | Attending: Emergency Medicine | Admitting: Emergency Medicine

## 2016-05-05 DIAGNOSIS — F17210 Nicotine dependence, cigarettes, uncomplicated: Secondary | ICD-10-CM | POA: Insufficient documentation

## 2016-05-05 DIAGNOSIS — Z79899 Other long term (current) drug therapy: Secondary | ICD-10-CM | POA: Insufficient documentation

## 2016-05-05 DIAGNOSIS — J449 Chronic obstructive pulmonary disease, unspecified: Secondary | ICD-10-CM | POA: Insufficient documentation

## 2016-05-05 DIAGNOSIS — M5441 Lumbago with sciatica, right side: Secondary | ICD-10-CM | POA: Insufficient documentation

## 2016-05-05 DIAGNOSIS — G8929 Other chronic pain: Secondary | ICD-10-CM

## 2016-05-05 MED ORDER — METHOCARBAMOL 500 MG PO TABS: 500.0000 mg | ORAL_TABLET | Freq: Three times a day (TID) | ORAL | 0 refills | Status: DC | PRN

## 2016-05-05 MED ORDER — METHOCARBAMOL 500 MG PO TABS
1000.0000 mg | ORAL_TABLET | Freq: Once | ORAL | Status: AC
  Administered 2016-05-05: 1000 mg via ORAL
  Filled 2016-05-05: qty 2

## 2016-05-05 MED ORDER — NAPROXEN 500 MG PO TABS: 500.0000 mg | ORAL_TABLET | Freq: Two times a day (BID) | ORAL | 0 refills | Status: DC

## 2016-05-05 MED ORDER — NAPROXEN 250 MG PO TABS
500.0000 mg | ORAL_TABLET | Freq: Once | ORAL | Status: AC
  Administered 2016-05-05: 500 mg via ORAL
  Filled 2016-05-05: qty 2

## 2016-05-05 NOTE — ED Triage Notes (Signed)
Pt sts lower back pain with radiation down left leg and knee pain
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
## 2016-05-05 NOTE — ED Notes (Signed)
NAD. Pt verbalize DC teaching and medications given.

## 2016-05-05 NOTE — Discharge Instructions (Signed)
Primary are is important for following up for your chronic back pain.  You may require additional physical therapy referral if not improving on medications.

## 2016-06-07 ENCOUNTER — Encounter (HOSPITAL_COMMUNITY): Payer: Self-pay | Admitting: Nurse Practitioner

## 2016-06-07 ENCOUNTER — Emergency Department (HOSPITAL_COMMUNITY)
Admission: EM | Admit: 2016-06-07 | Discharge: 2016-06-07 | Disposition: A | Discharge status: Home / Self Care | Payer: No Typology Code available for payment source | Attending: Emergency Medicine | Admitting: Emergency Medicine

## 2016-06-07 DIAGNOSIS — J449 Chronic obstructive pulmonary disease, unspecified: Secondary | ICD-10-CM | POA: Insufficient documentation

## 2016-06-07 DIAGNOSIS — R197 Diarrhea, unspecified: Secondary | ICD-10-CM

## 2016-06-07 DIAGNOSIS — R112 Nausea with vomiting, unspecified: Secondary | ICD-10-CM | POA: Insufficient documentation

## 2016-06-07 DIAGNOSIS — F17210 Nicotine dependence, cigarettes, uncomplicated: Secondary | ICD-10-CM | POA: Insufficient documentation

## 2016-06-07 DIAGNOSIS — Z79899 Other long term (current) drug therapy: Secondary | ICD-10-CM | POA: Insufficient documentation

## 2016-06-07 LAB — COMPREHENSIVE METABOLIC PANEL
ALT: 16 U/L (ref 14–54)
AST: 24 U/L (ref 15–41)
Albumin: 3.9 g/dL (ref 3.5–5.0)
Alkaline Phosphatase: 43 U/L (ref 38–126)
Anion gap: 5 (ref 5–15)
BUN: 7 mg/dL (ref 6–20)
CO2: 28 mmol/L (ref 22–32)
Calcium: 9.5 mg/dL (ref 8.9–10.3)
Chloride: 106 mmol/L (ref 101–111)
Creatinine, Ser: 0.68 mg/dL (ref 0.44–1.00)
GFR calc Af Amer: >60 mL/min (ref >60)
GFR calc non Af Amer: >60 mL/min (ref >60)
Glucose, Bld: 96 mg/dL (ref 65–99)
Potassium: 3.8 mmol/L (ref 3.5–5.1)
Sodium: 139 mmol/L (ref 135–145)
Total Bilirubin: 0.2 mg/dL — ABNORMAL LOW (ref 0.3–1.2)
Total Protein: 7.3 g/dL (ref 6.5–8.1)

## 2016-06-07 LAB — URINALYSIS, ROUTINE W REFLEX MICROSCOPIC
Bilirubin Urine: NEGATIVE
Glucose, UA: NEGATIVE mg/dL
Hgb urine dipstick: NEGATIVE
Ketones, ur: NEGATIVE mg/dL
Leukocytes, UA: NEGATIVE
Nitrite: NEGATIVE
Protein, ur: NEGATIVE mg/dL
Specific Gravity, Urine: 1.016 (ref 1.005–1.030)
pH: 8 (ref 5.0–8.0)

## 2016-06-07 LAB — CBC
HCT: 27.6 % — ABNORMAL LOW (ref 36.0–46.0)
Hemoglobin: 8.3 g/dL — ABNORMAL LOW (ref 12.0–15.0)
MCH: 22.5 pg — ABNORMAL LOW (ref 26.0–34.0)
MCHC: 30.1 g/dL (ref 30.0–36.0)
MCV: 74.8 fL — ABNORMAL LOW (ref 78.0–100.0)
Platelets: 349 10*3/uL (ref 150–400)
RBC: 3.69 MIL/uL — ABNORMAL LOW (ref 3.87–5.11)
RDW: 20 % — ABNORMAL HIGH (ref 11.5–15.5)
WBC: 6.1 10*3/uL (ref 4.0–10.5)

## 2016-06-07 LAB — I-STAT TROPONIN, ED: Troponin i, poc: 0 ng/mL (ref 0.00–0.08)

## 2016-06-07 MED ORDER — LOPERAMIDE HCL 2 MG PO CAPS
4.0000 mg | ORAL_CAPSULE | Freq: Once | ORAL | Status: AC
  Administered 2016-06-07: 4 mg via ORAL
  Filled 2016-06-07: qty 2

## 2016-06-07 MED ORDER — ONDANSETRON HCL 4 MG PO TABS: 4.0000 mg | ORAL_TABLET | Freq: Three times a day (TID) | ORAL | 0 refills | Status: DC | PRN

## 2016-06-07 MED ORDER — ONDANSETRON HCL 4 MG/2ML IJ SOLN
4.0000 mg | Freq: Once | INTRAMUSCULAR | Status: AC
  Administered 2016-06-07: 4 mg via INTRAVENOUS
  Filled 2016-06-07: qty 2

## 2016-06-07 MED ORDER — SODIUM CHLORIDE 0.9 % IV BOLUS (SEPSIS)
1000.0000 mL | Freq: Once | INTRAVENOUS | Status: AC
  Administered 2016-06-07: 1000 mL via INTRAVENOUS

## 2016-06-07 MED ORDER — HYDROMORPHONE HCL 2 MG/ML IJ SOLN
0.5000 mg | Freq: Once | INTRAMUSCULAR | Status: AC
  Administered 2016-06-07: 0.5 mg via INTRAVENOUS
  Filled 2016-06-07: qty 1

## 2016-06-07 NOTE — ED Provider Notes (Signed)
[facility] DEPT Provider Note   CSN: [id number] Arrival date & time: 06/07/16  1123  By signing my name below, I, Sonum Patel, attest that this documentation has been prepared under the direction and in the presence of Virgel Manifold, MD. Electronically Signed: Sonum Patel, Education administrator. 06/07/16. 12:36 PM.  History   Chief Complaint Chief Complaint  Patient presents with  . GI Problem   The history is provided by the patient. No language interpreter was used.     HPI Comments: Nicole Cisneros is a 47 y.o. female who presents to the Emergency Department complaining of persistent nausea and vomiting with associated diarrhea that began 7 hours ago. She notes several episodes of vomiting and diarrhea that continued as she went to work this morning. She further complains of generalized weakness, dizziness, decreased urine, and urgency that began a few hours ago. She has tried ginger ale without relief. She denies fever, hematochezia, hematemesis.    She also complains of a sore throat. She notes sick contacts diagnosed with the flu and strep throat.   Past Medical History:  Diagnosis Date  . Anemia   . Constipation   . COPD (chronic obstructive pulmonary disease) ([place])   . Cough   . Headache(784.0)   . Migraine   . Sore throat   . Weakness   . Weight loss     Patient Active Problem List   Diagnosis Date Noted  . Myalgias, multiple 10/23/2014  . Pain in joint, shoulder region 09/14/2014  . Left knee pain 06/21/2014  . GERD (gastroesophageal reflux disease) 03/14/2013  . Anemia 03/14/2013  . Smoking 03/14/2013  . Appendicitis 03/14/2011    Past Surgical History:  Procedure Laterality Date  . APPENDECTOMY  03/14/11  . LAPAROSCOPIC APPENDECTOMY  03/14/2011   Procedure: APPENDECTOMY LAPAROSCOPIC;  Surgeon: Shann Medal, MD;  Location: [facility];  Service: General;  Laterality: N/A;  . TUBAL LIGATION      OB History    No data available       Home Medications    Prior to  Admission medications   Medication Sig Start Date End Date Taking? Authorizing Provider  benzocaine (ORAJEL) 10 % mucosal gel Use as directed 1 application in the mouth or throat as needed for mouth pain.    Historical Provider, MD  DULoxetine (CYMBALTA) 20 MG capsule Take 1 capsule (20 mg total) by mouth daily. Patient not taking: Reported on 12/07/2014 09/14/14   Gerda Diss, DO  Fe Fum-Vit C-Vit B12-FA (TRIGELS-F) 460-60-0.01-1 MG CAPS capsule Take 1 capsule by mouth daily. Patient not taking: Reported on 12/07/2014 10/23/14   Dickie La, MD  ferrous sulfate 325 (65 FE) MG tablet Take 1 tablet (325 mg total) by mouth daily with breakfast. Patient not taking: Reported on 12/20/2014 10/30/14   Lorayne Marek, MD  gabapentin (NEURONTIN) 300 MG capsule Take 1 capsule (300 mg total) by mouth at bedtime. Patient not taking: Reported on 12/20/2014 09/10/14   Melony Overly, MD  methocarbamol (ROBAXIN) 500 MG tablet Take 1 tablet (500 mg total) by mouth 3 (three) times daily between meals as needed. 05/05/16   Tanna Furry, MD  methocarbamol (ROBAXIN) 500 MG tablet Take 1 tablet (500 mg total) by mouth 3 (three) times daily between meals as needed. 05/05/16   Tanna Furry, MD  naproxen (NAPROSYN) 500 MG tablet Take 1 tablet (500 mg total) by mouth 2 (two) times daily. 05/05/16   Tanna Furry, MD  naproxen (NAPROSYN) 500 MG tablet Take  1 tablet (500 mg total) by mouth 2 (two) times daily. 05/05/16   Tanna Furry, MD  omeprazole (PRILOSEC) 20 MG capsule Take 1 capsule (20 mg total) by mouth daily. 11/29/14   Domenic Moras, PA-C    Family History Family History  Problem Relation Age of Onset  . Cancer Father     bone  . Cancer Sister     breast  . Heart disease Mother   . Hypertension Mother     Social History Social History  Substance Use Topics  . Smoking status: Current Every Day Smoker    Packs/day: 1.00    Years: 12.00    Types: Cigarettes  . Smokeless tobacco: Never Used     Comment: Smoking 1 ppd  .  Alcohol use No     Allergies   Amoxicillin; Erythromycin; Percocet [oxycodone-acetaminophen]; and Vicodin [hydrocodone-acetaminophen]   Review of Systems Review of Systems  A complete 10 system review of systems was obtained and all systems are negative except as noted in the HPI and PMH.   Physical Exam Updated Vital Signs BP (!) 182/103 (BP Location: Right Arm) Comment: Notified Dustin(RN)  Pulse 64   Temp 97.8 F (36.6 C) (Oral)   Resp 25   Ht 5\' 5"  (1.651 m)   Wt 115 lb (52.2 kg)   SpO2 100%   BMI 19.14 kg/m   Physical Exam  Constitutional: She is oriented to person, place, and time. She appears well-developed and well-nourished. No distress.  HENT:  Head: Normocephalic and atraumatic.  Mouth/Throat: Uvula is midline.  Mild pharyngitis. Uvula midline. Normal sounding voice.  Eyes: EOM are normal.  Neck: Normal range of motion.  Cardiovascular: Normal rate, regular rhythm and normal heart sounds.   Pulmonary/Chest: Effort normal and breath sounds normal.  Abdominal: Soft. She exhibits no distension. There is tenderness. There is no rebound and no guarding.  Mild diffuse tenderness.  Musculoskeletal: Normal range of motion.  Neurological: She is alert and oriented to person, place, and time.  Skin: Skin is warm and dry.  Psychiatric: She has a normal mood and affect. Judgment normal.  Nursing note and vitals reviewed.    ED Treatments / Results  DIAGNOSTIC STUDIES: Oxygen Saturation is 100% on RA, normal by my interpretation.    COORDINATION OF CARE: 12:19 PM Discussed treatment plan with pt at bedside and pt agreed to plan.   Labs (all labs ordered are listed, but only abnormal results are displayed) Labs Reviewed  COMPREHENSIVE METABOLIC PANEL - Abnormal; Notable for the following:       Result Value   Total Bilirubin 0.2 (*)    All other components within normal limits  CBC - Abnormal; Notable for the following:    RBC 3.69 (*)    Hemoglobin 8.3 (*)     HCT 27.6 (*)    MCV 74.8 (*)    MCH 22.5 (*)    RDW 20.0 (*)    All other components within normal limits  URINALYSIS, ROUTINE W REFLEX MICROSCOPIC - Abnormal; Notable for the following:    APPearance CLOUDY (*)    All other components within normal limits  I-STAT TROPOININ, ED    EKG  EKG Interpretation None       Radiology No results found.  Procedures Procedures (including critical care time)  Medications Ordered in ED Medications - No data to display   Initial Impression / Assessment and Plan / ED Course  I have reviewed the triage vital signs and the nursing  notes.  Pertinent labs & imaging results that were available during my care of the patient were reviewed by me and considered in my medical decision making (see chart for details).     45yF with n/v/d. Suspect viral GI illness. Anemia noted. Likely chronic with her vitals signs. Doubt urgent/emergent bleed. It has been determined that no acute conditions requiring further emergency intervention are present at this time. The patient has been advised of the diagnosis and plan. I reviewed any labs and imaging including any potential incidental findings. We have discussed signs and symptoms that warrant return to the ED and they are listed in the discharge instructions.    Final Clinical Impressions(s) / ED Diagnoses   Final diagnoses:  Nausea vomiting and diarrhea    New Prescriptions New Prescriptions   No medications on file   I personally preformed the services scribed in my presence. The recorded information has been reviewed is accurate. Virgel Manifold, MD.    Virgel Manifold, MD 06/23/16 1435

## 2016-06-07 NOTE — ED Triage Notes (Signed)
Pt presents with c/o GI problem. She woke this morning with nausea, vomiting, and diarrhea. Symptoms have been constant since onset. She reports dizziness, sweats, chills,heartburn. She denies headache, sore throat, congestion, cough. Household members have been ill with flu but did not have nausea, vomiting, diarrhea. She tried alka seltzer heartburn with no relief.

## 2016-06-07 NOTE — ED Notes (Signed)
Patient was able to drink 292ml of Sprite and multiple crackers.

## 2016-07-20 ENCOUNTER — Encounter (HOSPITAL_COMMUNITY): Payer: Self-pay

## 2016-07-20 ENCOUNTER — Emergency Department (HOSPITAL_COMMUNITY)
Admission: EM | Admit: 2016-07-20 | Discharge: 2016-07-20 | Disposition: A | Discharge status: Home / Self Care | Payer: Self-pay | Attending: Emergency Medicine | Admitting: Emergency Medicine

## 2016-07-20 ENCOUNTER — Emergency Department (HOSPITAL_COMMUNITY): Payer: Self-pay

## 2016-07-20 DIAGNOSIS — F17210 Nicotine dependence, cigarettes, uncomplicated: Secondary | ICD-10-CM | POA: Insufficient documentation

## 2016-07-20 DIAGNOSIS — J40 Bronchitis, not specified as acute or chronic: Secondary | ICD-10-CM | POA: Insufficient documentation

## 2016-07-20 DIAGNOSIS — J449 Chronic obstructive pulmonary disease, unspecified: Secondary | ICD-10-CM | POA: Insufficient documentation

## 2016-07-20 DIAGNOSIS — R05 Cough: Secondary | ICD-10-CM

## 2016-07-20 DIAGNOSIS — R059 Cough, unspecified: Secondary | ICD-10-CM

## 2016-07-20 MED ORDER — ALBUTEROL SULFATE HFA 108 (90 BASE) MCG/ACT IN AERS
2.0000 | INHALATION_SPRAY | RESPIRATORY_TRACT | Status: DC | PRN
  Administered 2016-07-20: 2 via RESPIRATORY_TRACT
  Filled 2016-07-20: qty 6.7

## 2016-07-20 MED ORDER — PROMETHAZINE-DM 6.25-15 MG/5ML PO SYRP: 5.0000 mL | ORAL_SOLUTION | Freq: Four times a day (QID) | ORAL | 0 refills | Status: DC | PRN

## 2016-07-20 MED ORDER — IPRATROPIUM-ALBUTEROL 0.5-2.5 (3) MG/3ML IN SOLN
3.0000 mL | Freq: Once | RESPIRATORY_TRACT | Status: AC
  Administered 2016-07-20: 3 mL via RESPIRATORY_TRACT
  Filled 2016-07-20: qty 3

## 2016-07-20 MED ORDER — AEROCHAMBER PLUS FLO-VU SMALL MISC
1.0000 | Freq: Once | Status: AC
  Administered 2016-07-20: 1
  Filled 2016-07-20: qty 1

## 2016-07-20 NOTE — ED Provider Notes (Signed)
[facility] DEPT Provider Note   CSN: [id number] Arrival date & time: 07/20/16  1443    By signing my name below, I, Macon Large, attest that this documentation has been prepared under the direction and in the presence of Joline Maxcy, PA. Electronically Signed: Macon Large, ED Scribe. 07/20/16. 10:31 AM.  History   Chief Complaint No chief complaint on file.  The history is provided by the patient. No language interpreter was used.   HPI Comments: Nicole Cisneros is a 46 y.o. female who presents to the Emergency Department with a chief complaint of gradually worsening, persistent, non-productive cough onset two days ago. She reports associated symptoms chest tightness accompanied by SOB and wheezing. She states "my chest is rattling". Pt also reports congestion, rhinorrhea, and headache.  Per pt, she notes having recent sick contact with her granddaughter. Pt denies ear pain, abdominal pain, rash, nausea, vomiting, dysuria. Symptoms were treated at home with nyquil with no relief. Pt denies having a flu-shot administered this year. She reports being a current 1 ppd smoker. She reports she was told about 10 years ago that she had COPD, but has not taken any medication for it since that time. No other chronic medical conditions. No daily medications. She is allergic to amoxicillin and erythromycin.    Past Medical History:  Diagnosis Date  . Anemia   . Constipation   . COPD (chronic obstructive pulmonary disease) ([place])   . Cough   . Headache(784.0)   . Migraine   . Sore throat   . Weakness   . Weight loss     Patient Active Problem List   Diagnosis Date Noted  . Myalgias, multiple 10/23/2014  . Pain in joint, shoulder region 09/14/2014  . Left knee pain 06/21/2014  . GERD (gastroesophageal reflux disease) 03/14/2013  . Anemia 03/14/2013  . Smoking 03/14/2013  . Appendicitis 03/14/2011    Past Surgical History:  Procedure Laterality Date  . APPENDECTOMY   03/14/11  . LAPAROSCOPIC APPENDECTOMY  03/14/2011   Procedure: APPENDECTOMY LAPAROSCOPIC;  Surgeon: Shann Medal, MD;  Location: [facility];  Service: General;  Laterality: N/A;  . TUBAL LIGATION      OB History    No data available       Home Medications    Prior to Admission medications   Medication Sig Start Date End Date Taking? Authorizing Provider  benzocaine (ORAJEL) 10 % mucosal gel Use as directed 1 application in the mouth or throat as needed for mouth pain.    Historical Provider, MD  DULoxetine (CYMBALTA) 20 MG capsule Take 1 capsule (20 mg total) by mouth daily. Patient not taking: Reported on 12/07/2014 09/14/14   Gerda Diss, DO  Fe Fum-Vit C-Vit B12-FA (TRIGELS-F) 460-60-0.01-1 MG CAPS capsule Take 1 capsule by mouth daily. Patient not taking: Reported on 12/07/2014 10/23/14   Dickie La, MD  ferrous sulfate 325 (65 FE) MG tablet Take 1 tablet (325 mg total) by mouth daily with breakfast. Patient not taking: Reported on 12/20/2014 10/30/14   Lorayne Marek, MD  gabapentin (NEURONTIN) 300 MG capsule Take 1 capsule (300 mg total) by mouth at bedtime. Patient not taking: Reported on 12/20/2014 09/10/14   Melony Overly, MD  methocarbamol (ROBAXIN) 500 MG tablet Take 1 tablet (500 mg total) by mouth 3 (three) times daily between meals as needed. 05/05/16   Tanna Furry, MD  methocarbamol (ROBAXIN) 500 MG tablet Take 1 tablet (500 mg total) by mouth 3 (three) times daily between  meals as needed. 05/05/16   Tanna Furry, MD  naproxen (NAPROSYN) 500 MG tablet Take 1 tablet (500 mg total) by mouth 2 (two) times daily. 05/05/16   Tanna Furry, MD  naproxen (NAPROSYN) 500 MG tablet Take 1 tablet (500 mg total) by mouth 2 (two) times daily. 05/05/16   Tanna Furry, MD  omeprazole (PRILOSEC) 20 MG capsule Take 1 capsule (20 mg total) by mouth daily. 11/29/14   Domenic Moras, PA-C  ondansetron (ZOFRAN) 4 MG tablet Take 1 tablet (4 mg total) by mouth every 8 (eight) hours as needed for nausea or vomiting.  06/07/16   Virgel Manifold, MD  promethazine-dextromethorphan (PROMETHAZINE-DM) 6.25-15 MG/5ML syrup Take 5 mLs by mouth 4 (four) times daily as needed for cough. 07/20/16   Rucha Wissinger A Louretta Tantillo, PA-C    Family History Family History  Problem Relation Age of Onset  . Cancer Father     bone  . Cancer Sister     breast  . Heart disease Mother   . Hypertension Mother     Social History Social History  Substance Use Topics  . Smoking status: Current Every Day Smoker    Packs/day: 1.00    Years: 12.00    Types: Cigarettes  . Smokeless tobacco: Never Used     Comment: Smoking 1 ppd  . Alcohol use No     Allergies   Amoxicillin; Erythromycin; Percocet [oxycodone-acetaminophen]; and Vicodin [hydrocodone-acetaminophen]   Review of Systems Review of Systems  Constitutional: Negative for fever.  HENT: Positive for congestion and rhinorrhea. Negative for ear pain, sinus pain, sinus pressure and sore throat.   Respiratory: Positive for cough, shortness of breath and wheezing.   Cardiovascular: Positive for chest pain (tightness).  Gastrointestinal: Negative for abdominal pain, nausea and vomiting.  Genitourinary: Negative for dysuria.  Skin: Negative for rash.  Allergic/Immunologic: Negative for immunocompromised state.  Neurological: Positive for headaches.  Psychiatric/Behavioral: Negative for confusion.   Physical Exam Updated Vital Signs BP 139/81 (BP Location: Left Arm)   Pulse 66   Temp 98 F (36.7 C) (Oral)   Resp 16   Ht 5' 3.5" (1.613 m)   Wt 61.3 kg   LMP 07/12/2016   SpO2 100%   BMI 23.55 kg/m   Physical Exam  Constitutional: She appears well-developed and well-nourished. No distress.  HENT:  Head: Normocephalic and atraumatic.  Right Ear: Tympanic membrane normal.  Left Ear: Tympanic membrane normal.  Nose: Rhinorrhea present. Right sinus exhibits no maxillary sinus tenderness and no frontal sinus tenderness. Left sinus exhibits no maxillary sinus tenderness and  no frontal sinus tenderness.  Mouth/Throat: Uvula is midline, oropharynx is clear and moist and mucous membranes are normal. No posterior oropharyngeal erythema.  Eyes: Conjunctivae are normal.  Neck: Neck supple.  Cardiovascular: Normal rate, regular rhythm and normal heart sounds.  Exam reveals no gallop and no friction rub.   No murmur heard. Pulmonary/Chest: Effort normal. No respiratory distress. She has wheezes. She has no rales.  Bilateral expiratory wheezes in lower fields.   Abdominal: Soft. She exhibits no distension. There is no tenderness. There is no guarding.  Musculoskeletal: She exhibits no edema.  Neurological: She is alert.  Skin: Skin is warm and dry. No rash noted. She is not diaphoretic.  Psychiatric: Her behavior is normal.  Nursing note and vitals reviewed.  ED Treatments / Results   DIAGNOSTIC STUDIES: Oxygen Saturation is 100% on RA, normal by my interpretation.    COORDINATION OF CARE: 10:25 AM Discussed treatment plan with  pt at bedside which includes chest imaging and breathing treatment and pt agreed to plan.   Labs (all labs ordered are listed, but only abnormal results are displayed) Labs Reviewed - No data to display  EKG  EKG Interpretation None       Radiology No results found.  Procedures Procedures (including critical care time)  Medications Ordered in ED Medications  ipratropium-albuterol (DUONEB) 0.5-2.5 (3) MG/3ML nebulizer solution 3 mL (3 mLs Nebulization Given 07/20/16 1128)  AEROCHAMBER PLUS FLO-VU SMALL device MISC 1 each (1 each Other Given 07/20/16 1235)   Initial Impression / Assessment and Plan / ED Course  I have reviewed the triage vital signs and the nursing notes.  Pertinent labs & imaging results that were available during my care of the patient were reviewed by me and considered in my medical decision making (see chart for details).     Patient with symptoms consistent with acute bronchitis. Patient in ED with O2  saturations maintained >90, no current signs of respiratory distress. Lung exam improved after nebulizer treatment. Pt states they are breathing at baseline. Will discharge home with albuterol inhaler, spacer, and symptomatic treatment for cough. BP improved from162/97 to 139/81 in the ED. Discussed return precautions to the ED and encouraged the patient to get established with a primary care provider.   Final Clinical Impressions(s) / ED Diagnoses   Final diagnoses:  Cough  Bronchitis   New Prescriptions Discharge Medication List as of 07/20/2016 12:27 PM    START taking these medications   Details  promethazine-dextromethorphan (PROMETHAZINE-DM) 6.25-15 MG/5ML syrup Take 5 mLs by mouth 4 (four) times daily as needed for cough., Starting Sun 07/20/2016, Print        I personally performed the services described in this documentation, which was scribed in my presence. The recorded information has been reviewed and is accurate.     Joanne Gavel, PA-C 07/22/16 [place] Yao, MD 07/23/16 [phone]

## 2016-07-20 NOTE — Discharge Instructions (Signed)
Please return to the emergency department for new or worsening symptoms.

## 2016-07-20 NOTE — ED Notes (Signed)
Declined W/C at D/C and was escorted to lobby by RN. 

## 2016-07-20 NOTE — ED Triage Notes (Signed)
Patient complains of 3 days of cough and congestion. Cough worse at night. Alert and oriented, NAD

## 2016-11-10 IMAGING — US US PELVIS COMPLETE
1 series · 15 of 25 positions shown · non-contrast
Comparison: 03/14/2011

CLINICAL DATA: Dysfunctional uterine bleeding for 6 years. Previous
tubal ligation. Menses last more than 3 weeks. LMP started
11/27/2014.

EXAM:
TRANSABDOMINAL AND TRANSVAGINAL ULTRASOUND OF PELVIS
TECHNIQUE: Both transabdominal and transvaginal ultrasound examinations of the
pelvis were performed. Transabdominal technique was performed for
global imaging of the pelvis including uterus, ovaries, adnexal
regions, and pelvic cul-de-sac. It was necessary to proceed with
endovaginal exam following the transabdominal exam to visualize the
endometrium and ovaries.

[Series 1: us pelvis complete · 15 of 43 slices shown]
[im 1/43]
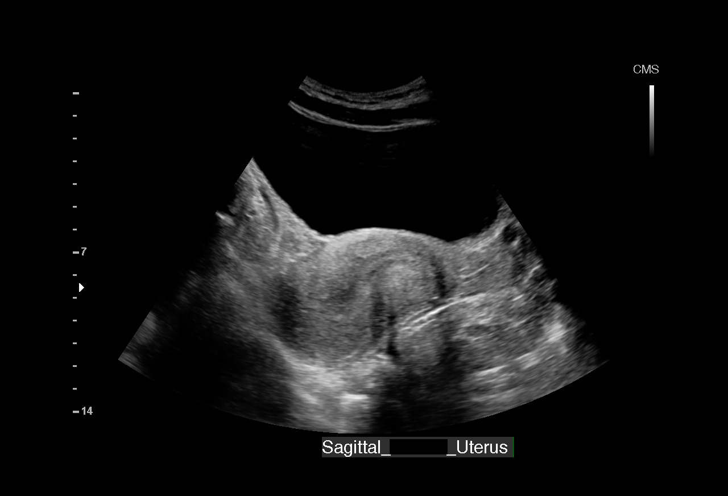
[im 4/43]
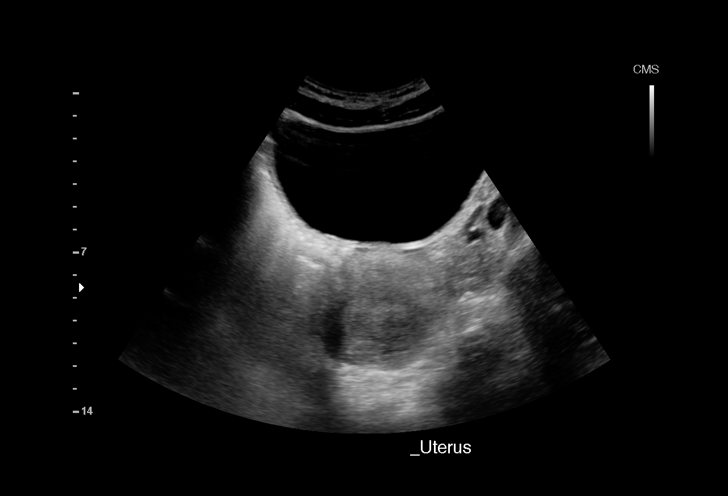
[im 8/43]
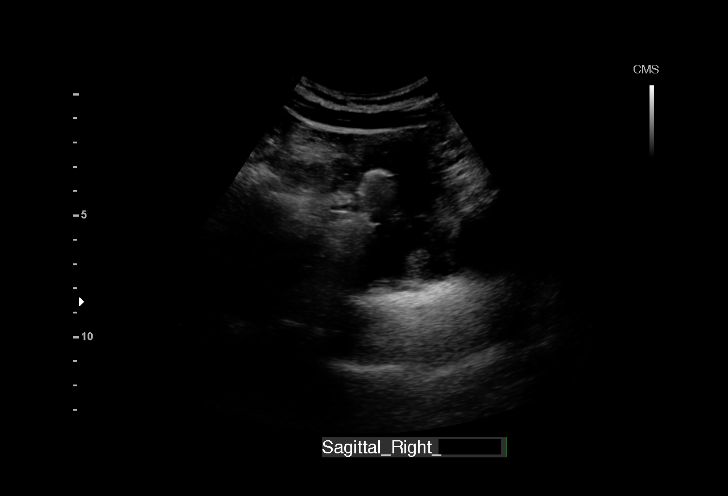
[im 9/43]
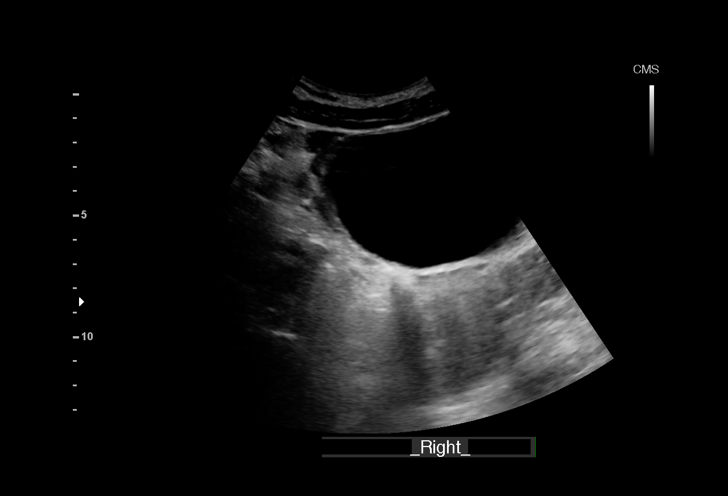
[im 13/43]
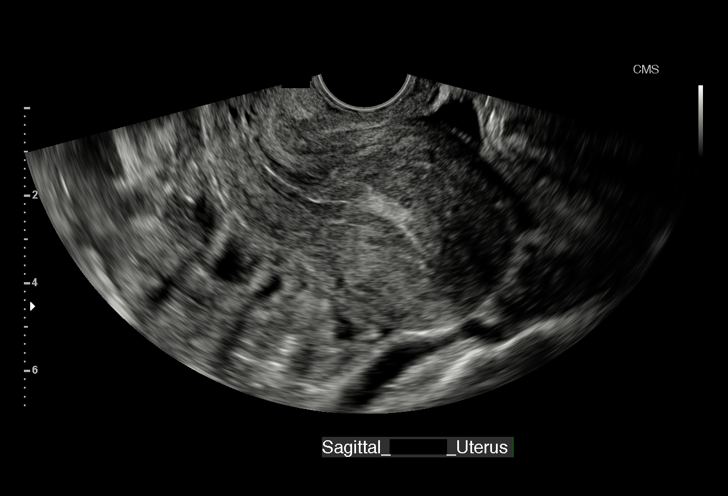
[im 16/43]
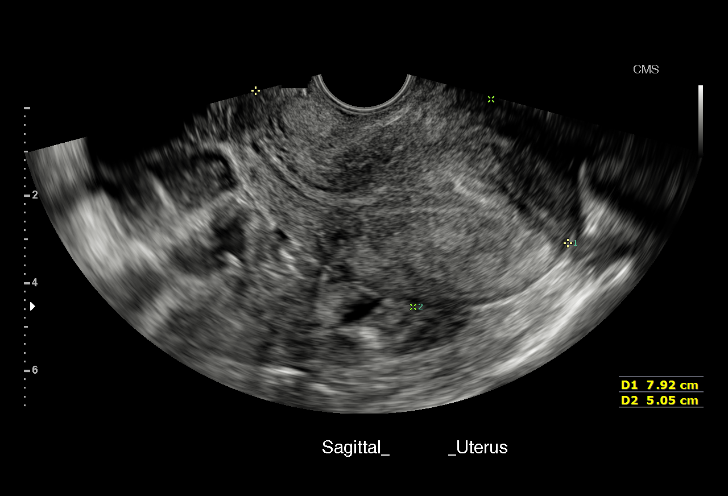
[im 18/43]
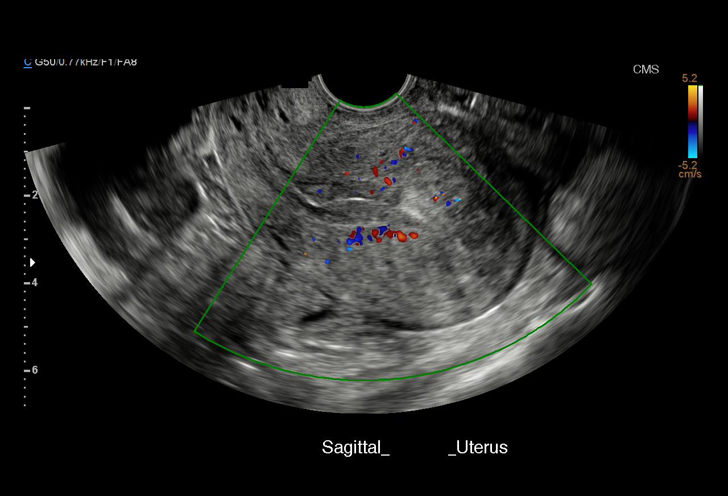
[im 22/43]
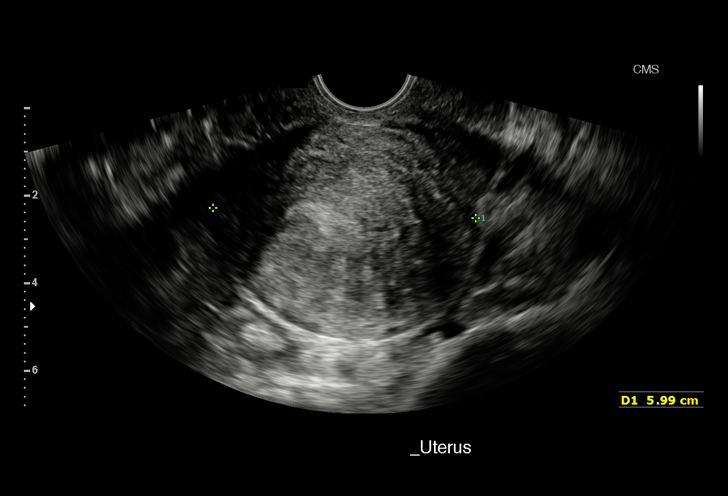
[im 25/43]
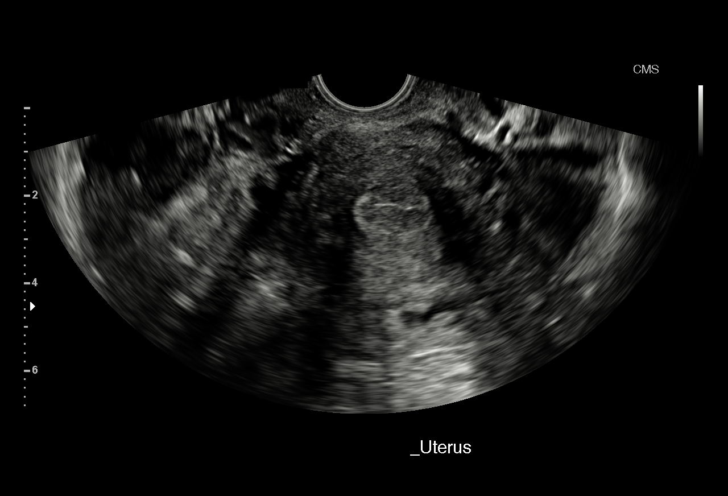
[im 27/43]
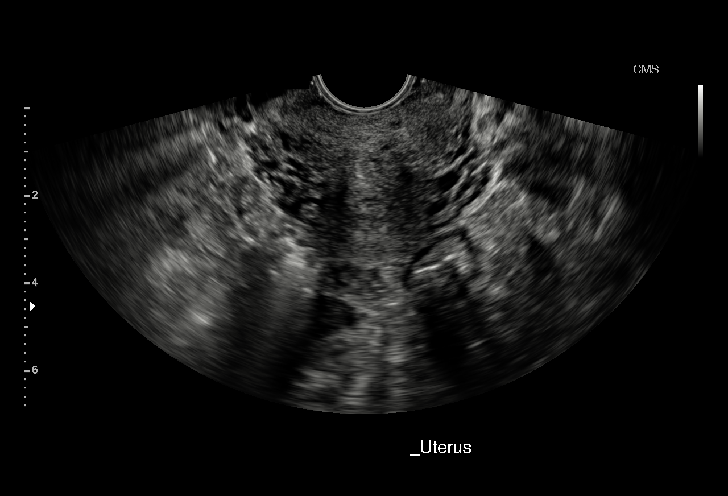
[im 30/43]
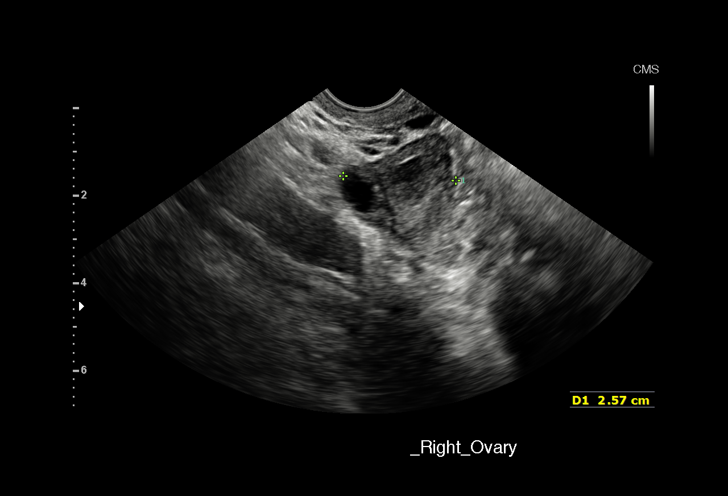
[im 34/43]
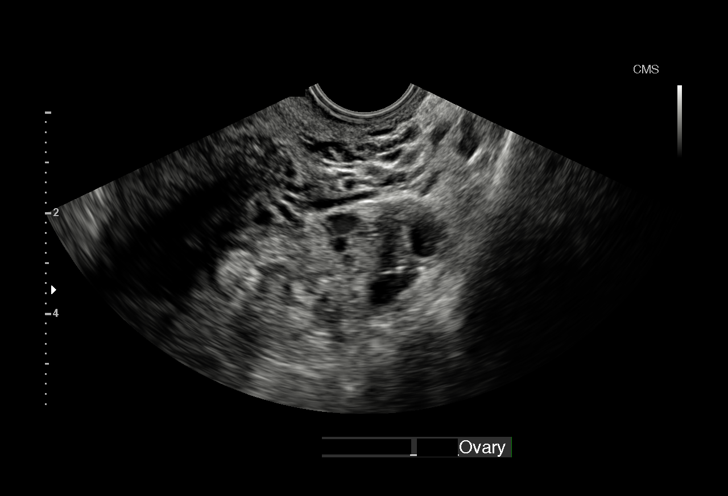
[im 36/43]
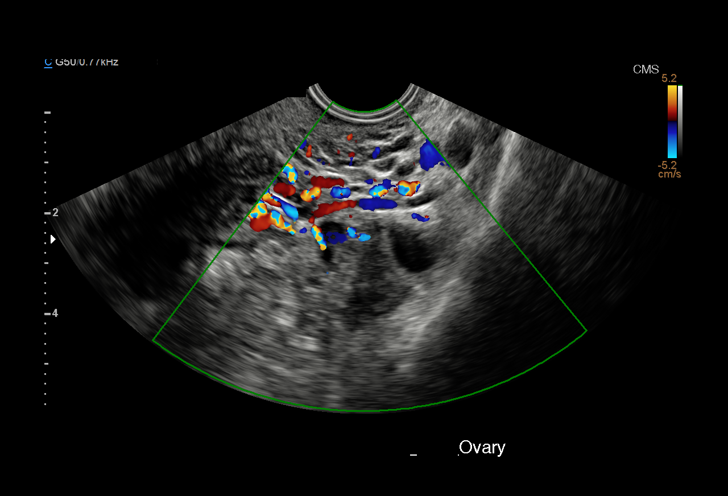
[im 39/43]
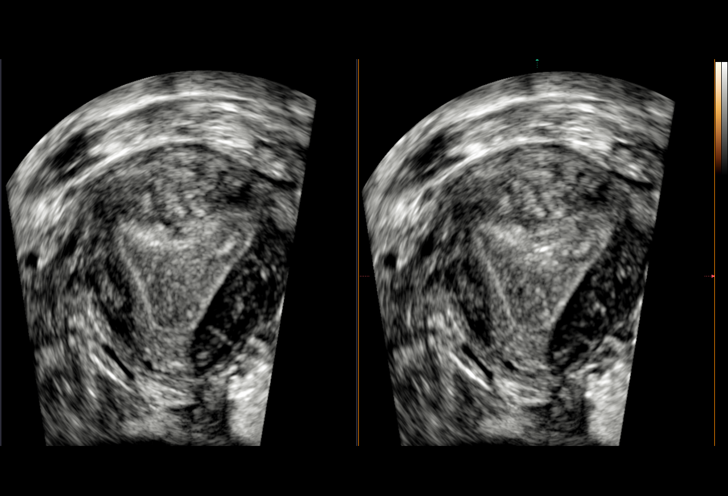
[im 43/43]
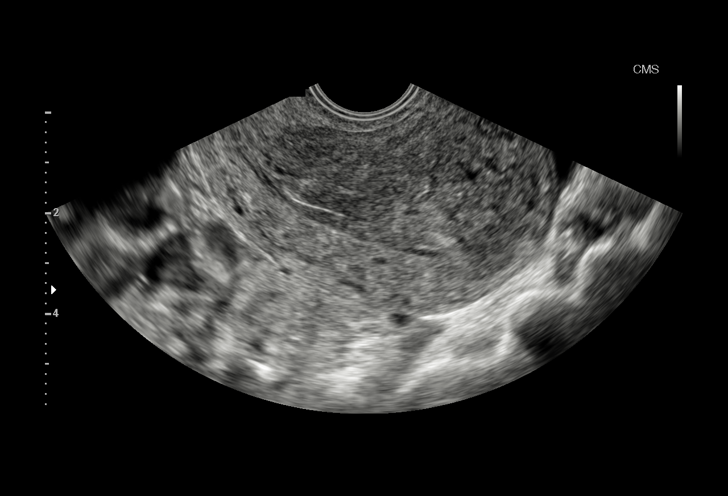

[15 of 25 positions shown; findings below may reference images not displayed]

FINDINGS: Uterus

Measurements: 7.9 x 5.1 x 6.0 cm. No fibroids or other mass
visualized.

Endometrium

Thickness: 9.0 mm. Heterogeneous without definite focal lesion.
Mobile endometrial contents noted likely representing residual clot
or blood.

Right ovary

Measurements: 3.2 x 2.6 x 2.6 cm. Normal appearance/no adnexal mass.

Left ovary

Measurements: 2.1 x 2.6 x 3.1 cm. Normal appearance/no adnexal mass.

Other findings

No free fluid.
IMPRESSION: 1. Normal appearance of the ovaries.
2. Mildly heterogeneous endometrium without definite focal mass.
Suspect debris/clot within the canal. If bleeding remains
unresponsive to hormonal or medical therapy, sonohysterogram should
be considered for focal lesion work-up. (Ref: Radiological
Reasoning: Algorithmic Workup of Abnormal Vaginal Bleeding with
Endovaginal Sonography and Sonohysterography. AJR [id number]; 191:S68-73)

## 2017-01-01 ENCOUNTER — Encounter (HOSPITAL_COMMUNITY): Payer: Self-pay | Admitting: Emergency Medicine

## 2017-01-01 DIAGNOSIS — Z79899 Other long term (current) drug therapy: Secondary | ICD-10-CM | POA: Insufficient documentation

## 2017-01-01 DIAGNOSIS — R112 Nausea with vomiting, unspecified: Secondary | ICD-10-CM | POA: Insufficient documentation

## 2017-01-01 DIAGNOSIS — F17210 Nicotine dependence, cigarettes, uncomplicated: Secondary | ICD-10-CM | POA: Insufficient documentation

## 2017-01-01 DIAGNOSIS — J449 Chronic obstructive pulmonary disease, unspecified: Secondary | ICD-10-CM | POA: Insufficient documentation

## 2017-01-01 DIAGNOSIS — R197 Diarrhea, unspecified: Secondary | ICD-10-CM | POA: Insufficient documentation

## 2017-01-01 LAB — COMPREHENSIVE METABOLIC PANEL
ALT: 15 U/L (ref 14–54)
AST: 23 U/L (ref 15–41)
Albumin: 4.1 g/dL (ref 3.5–5.0)
Alkaline Phosphatase: 49 U/L (ref 38–126)
Anion gap: 10 (ref 5–15)
BUN: 5 mg/dL — ABNORMAL LOW (ref 6–20)
CO2: 23 mmol/L (ref 22–32)
Calcium: 9.2 mg/dL (ref 8.9–10.3)
Chloride: 104 mmol/L (ref 101–111)
Creatinine, Ser: 0.65 mg/dL (ref 0.44–1.00)
GFR calc Af Amer: >60 mL/min (ref >60)
GFR calc non Af Amer: >60 mL/min (ref >60)
Glucose, Bld: 78 mg/dL (ref 65–99)
Potassium: 3.3 mmol/L — ABNORMAL LOW (ref 3.5–5.1)
Sodium: 137 mmol/L (ref 135–145)
Total Bilirubin: 0.5 mg/dL (ref 0.3–1.2)
Total Protein: 7.8 g/dL (ref 6.5–8.1)

## 2017-01-01 LAB — URINALYSIS, ROUTINE W REFLEX MICROSCOPIC
Bilirubin Urine: NEGATIVE
Glucose, UA: NEGATIVE mg/dL
Hgb urine dipstick: NEGATIVE
Ketones, ur: NEGATIVE mg/dL
Leukocytes, UA: NEGATIVE
Nitrite: NEGATIVE
Protein, ur: NEGATIVE mg/dL
Specific Gravity, Urine: 1.011 (ref 1.005–1.030)
pH: 6 (ref 5.0–8.0)

## 2017-01-01 LAB — I-STAT BETA HCG BLOOD, ED (MC, WL, AP ONLY): I-stat hCG, quantitative: <5 m[IU]/mL (ref <5)

## 2017-01-01 LAB — CBC
HCT: 29.2 % — ABNORMAL LOW (ref 36.0–46.0)
Hemoglobin: 8.7 g/dL — ABNORMAL LOW (ref 12.0–15.0)
MCH: 22.1 pg — ABNORMAL LOW (ref 26.0–34.0)
MCHC: 29.8 g/dL — ABNORMAL LOW (ref 30.0–36.0)
MCV: 74.1 fL — ABNORMAL LOW (ref 78.0–100.0)
Platelets: 288 10*3/uL (ref 150–400)
RBC: 3.94 MIL/uL (ref 3.87–5.11)
RDW: 21.3 % — ABNORMAL HIGH (ref 11.5–15.5)
WBC: 5.3 10*3/uL (ref 4.0–10.5)

## 2017-01-01 LAB — LIPASE, BLOOD: Lipase: 28 U/L (ref 11–51)

## 2017-01-01 MED ORDER — IBUPROFEN 400 MG PO TABS
ORAL_TABLET | ORAL | Status: AC
  Filled 2017-01-01: qty 1

## 2017-01-01 MED ORDER — IBUPROFEN 400 MG PO TABS
400.0000 mg | ORAL_TABLET | Freq: Once | ORAL | Status: AC
  Administered 2017-01-01: 400 mg via ORAL

## 2017-01-01 NOTE — ED Triage Notes (Signed)
Pt presents to ED for assessment of of nausea and vomiting since Tuesday night.  Patient states she has been afraid to eat anything because of the nausea.  Pt also c/o left sided headache.  Pt c/o nasal congestion as well.  C/o some diarrhea as well.  Pt denies changes in urination.

## 2017-01-02 ENCOUNTER — Emergency Department (HOSPITAL_COMMUNITY)
Admission: EM | Admit: 2017-01-02 | Discharge: 2017-01-02 | Disposition: A | Discharge status: Home / Self Care | Payer: No Typology Code available for payment source | Attending: Emergency Medicine | Admitting: Emergency Medicine

## 2017-01-02 DIAGNOSIS — R197 Diarrhea, unspecified: Secondary | ICD-10-CM

## 2017-01-02 DIAGNOSIS — R112 Nausea with vomiting, unspecified: Secondary | ICD-10-CM

## 2017-01-02 MED ORDER — ONDANSETRON HCL 4 MG/2ML IJ SOLN
4.0000 mg | Freq: Once | INTRAMUSCULAR | Status: AC
  Administered 2017-01-02: 4 mg via INTRAVENOUS
  Filled 2017-01-02: qty 2

## 2017-01-02 MED ORDER — SODIUM CHLORIDE 0.9 % IV BOLUS (SEPSIS)
1000.0000 mL | Freq: Once | INTRAVENOUS | Status: AC
  Administered 2017-01-02: 1000 mL via INTRAVENOUS

## 2017-01-02 MED ORDER — ONDANSETRON 4 MG PO TBDP: 4.0000 mg | ORAL_TABLET | Freq: Three times a day (TID) | ORAL | 0 refills | Status: DC | PRN

## 2017-01-02 MED ORDER — GI COCKTAIL ~~LOC~~
30.0000 mL | Freq: Once | ORAL | Status: AC
  Administered 2017-01-02: 30 mL via ORAL
  Filled 2017-01-02: qty 30

## 2017-01-02 NOTE — ED Provider Notes (Signed)
[facility] Provider Note   CSN: [id number] Arrival date & time: 01/01/17  1701     History   Chief Complaint Chief Complaint  Patient presents with  . Abdominal Pain  . Headache    HPI Nicole Cisneros is a 46 y.o. female.  Patient reports she's had nausea vomiting diarrhea abdominal pain for the past 3 days. States she's not been able to eat because of nausea. Have Zofran at home which has not been effective. Reports 3-4 episodes of loose nonbloody stools today. Also reports multiple episodes of nonbilious nonbloody emesis. Denies any sick contacts or recent travel. Denies any fever. Denies any antibiotic use. Denies any pain with urination or hematuria. 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 She has a gradual onset headache since she's been sick.patient has a history of COPD and anemia is on iron supplements. Previous appendectomy.   The history is provided by the patient.    Past Medical History:  Diagnosis Date  . Anemia   . Constipation   . COPD (chronic obstructive pulmonary disease) ([place])   . Cough   . Headache(784.0)   . Migraine   . Sore throat   . Weakness   . Weight loss     Patient Active Problem List   Diagnosis Date Noted  . Myalgias, multiple 10/23/2014  . Pain in joint, shoulder region 09/14/2014  . Left knee pain 06/21/2014  . GERD (gastroesophageal reflux disease) 03/14/2013  . Anemia 03/14/2013  . Smoking 03/14/2013  . Appendicitis 03/14/2011    Past Surgical History:  Procedure Laterality Date  . APPENDECTOMY  03/14/11  . LAPAROSCOPIC APPENDECTOMY  03/14/2011   Procedure: APPENDECTOMY LAPAROSCOPIC;  Surgeon: Shann Medal, MD;  Location: [facility];  Service: General;  Laterality: N/A;  . TUBAL LIGATION      OB History    No data available       Home Medications    Prior to Admission medications   Medication Sig Start Date End Date Taking? Authorizing Provider  benzocaine (ORAJEL) 10 %  mucosal gel Use as directed 1 application in the mouth or throat as needed for mouth pain.    [provider]  DULoxetine (CYMBALTA) 20 MG capsule Take 1 capsule (20 mg total) by mouth daily. Patient not taking: Reported on 12/07/2014 09/14/14   Gerda Diss, DO  Fe Fum-Vit C-Vit B12-FA (TRIGELS-F) 460-60-0.01-1 MG CAPS capsule Take 1 capsule by mouth daily. Patient not taking: Reported on 12/07/2014 10/23/14   Dickie La, MD  ferrous sulfate 325 (65 FE) MG tablet Take 1 tablet (325 mg total) by mouth daily with breakfast. Patient not taking: Reported on 12/20/2014 10/30/14   Lorayne Marek, MD  gabapentin (NEURONTIN) 300 MG capsule Take 1 capsule (300 mg total) by mouth at bedtime. Patient not taking: Reported on 12/20/2014 09/10/14   Melony Overly, MD  methocarbamol (ROBAXIN) 500 MG tablet Take 1 tablet (500 mg total) by mouth 3 (three) times daily between meals as needed. 05/05/16   Tanna Furry, MD  methocarbamol (ROBAXIN) 500 MG tablet Take 1 tablet (500 mg total) by mouth 3 (three) times daily between meals as needed. 05/05/16   Tanna Furry, MD  naproxen (NAPROSYN) 500 MG tablet Take 1 tablet (500 mg total) by mouth 2 (two) times daily. 05/05/16   Tanna Furry, MD  naproxen (NAPROSYN) 500 MG tablet Take 1 tablet (500 mg total) by mouth 2 (two) times daily. 05/05/16  Tanna Furry, MD  omeprazole (PRILOSEC) 20 MG capsule Take 1 capsule (20 mg total) by mouth daily. 11/29/14   Domenic Moras, PA-C  ondansetron (ZOFRAN) 4 MG tablet Take 1 tablet (4 mg total) by mouth every 8 (eight) hours as needed for nausea or vomiting. 06/07/16   Virgel Manifold, MD  promethazine-dextromethorphan (PROMETHAZINE-DM) 6.25-15 MG/5ML syrup Take 5 mLs by mouth 4 (four) times daily as needed for cough. 07/20/16   McDonald, Laymond Purser, PA-C    Family History Family History  Problem Relation Age of Onset  . Cancer Father        bone  . Cancer Sister        breast  . Heart disease Mother   . Hypertension Mother     Social  History Social History  Substance Use Topics  . Smoking status: Current Every Day Smoker    Packs/day: 1.00    Years: 12.00    Types: Cigarettes  . Smokeless tobacco: Never Used     Comment: Smoking 1 ppd  . Alcohol use No     Allergies   Amoxicillin; Erythromycin; Percocet [oxycodone-acetaminophen]; and Vicodin [hydrocodone-acetaminophen]   Review of Systems Review of Systems  Constitutional: Positive for activity change and appetite change. Negative for fever.  HENT: Negative for congestion and rhinorrhea.   Respiratory: Negative for cough, chest tightness and shortness of breath.   Cardiovascular: Negative for chest pain.  Gastrointestinal: Positive for abdominal pain, diarrhea, nausea and vomiting.  Genitourinary: Negative for dysuria and hematuria.  Musculoskeletal: Positive for arthralgias and myalgias. Negative for back pain.  Neurological: Negative for dizziness, weakness and headaches.   all other systems are negative except as noted in the HPI and PMH.     Physical Exam Updated Vital Signs BP 137/84 (BP Location: Right Arm)   Pulse 61   Temp 98.3 F (36.8 C) (Oral)   Resp 18   LMP 12/14/2016   SpO2 100%   Physical Exam  Constitutional: She is oriented to person, place, and time. She appears well-developed and well-nourished. No distress.  HENT:  Head: Normocephalic and atraumatic.  Mouth/Throat: Oropharynx is clear and moist. No oropharyngeal exudate.  Moist mucus membranes Nontoxic appearing  Eyes: Pupils are equal, round, and reactive to light. Conjunctivae and EOM are normal.  Neck: Normal range of motion. Neck supple.  No meningismus.  Cardiovascular: Normal rate, regular rhythm, normal heart sounds and intact distal pulses.   No murmur heard. Pulmonary/Chest: Effort normal and breath sounds normal. No respiratory distress. She exhibits no tenderness.  Abdominal: Soft. There is tenderness. There is no rebound and no guarding.  Mild diffuse  tenderness  Musculoskeletal: Normal range of motion. She exhibits no edema or tenderness.  Neurological: She is alert and oriented to person, place, and time. No cranial nerve deficit. She exhibits normal muscle tone. Coordination normal.   5/5 strength throughout. CN 2-12 intact.Equal grip strength.   Skin: Skin is warm. Capillary refill takes less than 2 seconds.  Psychiatric: She has a normal mood and affect. Her behavior is normal.  Nursing note and vitals reviewed.    ED Treatments / Results  Labs (all labs ordered are listed, but only abnormal results are displayed) Labs Reviewed  COMPREHENSIVE METABOLIC PANEL - Abnormal; Notable for the following:       Result Value   Potassium 3.3 (*)    BUN 5 (*)    All other components within normal limits  CBC - Abnormal; Notable for the following:  Hemoglobin 8.7 (*)    HCT 29.2 (*)    MCV 74.1 (*)    MCH 22.1 (*)    MCHC 29.8 (*)    RDW 21.3 (*)    All other components within normal limits  URINALYSIS, ROUTINE W REFLEX MICROSCOPIC - Abnormal; Notable for the following:    APPearance HAZY (*)    All other components within normal limits  LIPASE, BLOOD  I-STAT BETA HCG BLOOD, ED ([facility], [facility], [facility] ONLY)    EKG  EKG Interpretation None       Radiology No results found.  Procedures Procedures (including critical care time)  Medications Ordered in ED Medications  sodium chloride 0.9 % bolus 1,000 mL (1,000 mLs Intravenous New Bag/Given 01/02/17 0435)  ibuprofen (ADVIL,MOTRIN) tablet 400 mg (400 mg Oral Given 01/01/17 1811)  ondansetron (ZOFRAN) injection 4 mg (4 mg Intravenous Given 01/02/17 0435)     Initial Impression / Assessment and Plan / ED Course  I have reviewed the triage vital signs and the nursing notes.  Pertinent labs & imaging results that were available during my care of the patient were reviewed by me and considered in my medical decision making (see chart for details).     2 days of nausea vomiting and  diarrhea and abdominal pain. Abdomen is soft without peritoneal signs. No fever.  IVF and zofran given. Labs reassuring. No ketones in urine. HCG negative  Patient tolerating PO in the ED. Abdomen soft, no peritoneal signs.  Treat supportively for likely gastroenteritis. PCP followup. Return precautions discussed. Final Clinical Impressions(s) / ED Diagnoses   Final diagnoses:  Nausea vomiting and diarrhea    New Prescriptions New Prescriptions   No medications on file     Ezequiel Essex, MD 01/02/17 [phone]

## 2017-01-02 NOTE — Discharge Instructions (Signed)
Keep yourself hydrated. Take the nausea medication as prescribed. Return to the ED if you develop new or worsening symptoms.

## 2017-06-30 ENCOUNTER — Emergency Department (HOSPITAL_COMMUNITY): Payer: No Typology Code available for payment source

## 2017-06-30 ENCOUNTER — Emergency Department (HOSPITAL_COMMUNITY)
Admission: EM | Admit: 2017-06-30 | Discharge: 2017-06-30 | Disposition: A | Discharge status: Home / Self Care | Payer: No Typology Code available for payment source | Attending: Emergency Medicine | Admitting: Emergency Medicine

## 2017-06-30 ENCOUNTER — Other Ambulatory Visit: Payer: Self-pay

## 2017-06-30 ENCOUNTER — Encounter (HOSPITAL_COMMUNITY): Payer: Self-pay | Admitting: *Deleted

## 2017-06-30 DIAGNOSIS — J40 Bronchitis, not specified as acute or chronic: Secondary | ICD-10-CM

## 2017-06-30 DIAGNOSIS — E876 Hypokalemia: Secondary | ICD-10-CM

## 2017-06-30 DIAGNOSIS — J449 Chronic obstructive pulmonary disease, unspecified: Secondary | ICD-10-CM | POA: Insufficient documentation

## 2017-06-30 DIAGNOSIS — R69 Illness, unspecified: Secondary | ICD-10-CM

## 2017-06-30 DIAGNOSIS — D649 Anemia, unspecified: Secondary | ICD-10-CM | POA: Insufficient documentation

## 2017-06-30 DIAGNOSIS — J209 Acute bronchitis, unspecified: Secondary | ICD-10-CM | POA: Insufficient documentation

## 2017-06-30 DIAGNOSIS — F17210 Nicotine dependence, cigarettes, uncomplicated: Secondary | ICD-10-CM | POA: Insufficient documentation

## 2017-06-30 DIAGNOSIS — J111 Influenza due to unidentified influenza virus with other respiratory manifestations: Secondary | ICD-10-CM | POA: Insufficient documentation

## 2017-06-30 DIAGNOSIS — Z79899 Other long term (current) drug therapy: Secondary | ICD-10-CM | POA: Insufficient documentation

## 2017-06-30 LAB — BASIC METABOLIC PANEL
Anion gap: 8 (ref 5–15)
BUN: <5 mg/dL — ABNORMAL LOW (ref 6–20)
CO2: 22 mmol/L (ref 22–32)
Calcium: 8.7 mg/dL — ABNORMAL LOW (ref 8.9–10.3)
Chloride: 107 mmol/L (ref 101–111)
Creatinine, Ser: 0.63 mg/dL (ref 0.44–1.00)
GFR calc Af Amer: >60 mL/min (ref >60)
GFR calc non Af Amer: >60 mL/min (ref >60)
Glucose, Bld: 90 mg/dL (ref 65–99)
Potassium: 3.3 mmol/L — ABNORMAL LOW (ref 3.5–5.1)
Sodium: 137 mmol/L (ref 135–145)

## 2017-06-30 LAB — I-STAT TROPONIN, ED
Troponin i, poc: 0 ng/mL (ref 0.00–0.08)
Troponin i, poc: 0 ng/mL (ref 0.00–0.08)

## 2017-06-30 LAB — CBC
HCT: 28.2 % — ABNORMAL LOW (ref 36.0–46.0)
Hemoglobin: 8.5 g/dL — ABNORMAL LOW (ref 12.0–15.0)
MCH: 22.3 pg — ABNORMAL LOW (ref 26.0–34.0)
MCHC: 30.1 g/dL (ref 30.0–36.0)
MCV: 74 fL — ABNORMAL LOW (ref 78.0–100.0)
Platelets: 223 10*3/uL (ref 150–400)
RBC: 3.81 MIL/uL — ABNORMAL LOW (ref 3.87–5.11)
RDW: 20.8 % — ABNORMAL HIGH (ref 11.5–15.5)
WBC: 5.2 10*3/uL (ref 4.0–10.5)

## 2017-06-30 LAB — I-STAT BETA HCG BLOOD, ED (MC, WL, AP ONLY): I-stat hCG, quantitative: <5 m[IU]/mL (ref <5)

## 2017-06-30 MED ORDER — POTASSIUM CHLORIDE CRYS ER 20 MEQ PO TBCR: 40.0000 meq | EXTENDED_RELEASE_TABLET | Freq: Two times a day (BID) | ORAL | 0 refills | Status: DC

## 2017-06-30 MED ORDER — DOXYCYCLINE HYCLATE 100 MG PO CAPS: 100.0000 mg | ORAL_CAPSULE | Freq: Two times a day (BID) | ORAL | 0 refills | Status: DC

## 2017-06-30 MED ORDER — OXYMETAZOLINE HCL 0.05 % NA SOLN
1.0000 | Freq: Once | NASAL | Status: AC
  Administered 2017-06-30: 1 via NASAL
  Filled 2017-06-30: qty 15

## 2017-06-30 MED ORDER — ALBUTEROL SULFATE HFA 108 (90 BASE) MCG/ACT IN AERS: 1.0000 | INHALATION_SPRAY | Freq: Four times a day (QID) | RESPIRATORY_TRACT | 0 refills | Status: DC | PRN

## 2017-06-30 MED ORDER — PROMETHAZINE-DM 6.25-15 MG/5ML PO SYRP: 2.5000 mL | ORAL_SOLUTION | Freq: Four times a day (QID) | ORAL | 0 refills | Status: DC | PRN

## 2017-06-30 MED ORDER — FLUTICASONE PROPIONATE 50 MCG/ACT NA SUSP: 2.0000 | Freq: Every day | NASAL | 0 refills | Status: DC

## 2017-06-30 MED ORDER — IPRATROPIUM-ALBUTEROL 0.5-2.5 (3) MG/3ML IN SOLN
3.0000 mL | Freq: Once | RESPIRATORY_TRACT | Status: AC
  Administered 2017-06-30: 3 mL via RESPIRATORY_TRACT
  Filled 2017-06-30: qty 3

## 2017-06-30 NOTE — ED Triage Notes (Addendum)
Pt reports having recent cough and congestion, unable to sleep at night. Now has non productive cough, sore throat and bodyaches. Has chest pain that occurs when coughing and breathing. No acute distress is noted at triage.mask on at triage.

## 2017-06-30 NOTE — Discharge Instructions (Signed)
Symptoms most like from a viral illness possibly influenza which has lead to bronchitis. Your chest x-ray looked ok, however your hemoglobin is 8.5 (baseline) and potassium was mildly low. You need to take iron supplements. Prescription for potassium given for one week. Follow up with your primary care provider for further evaluation of anemia and hypokalemia.   For nasal congestion use afrin spray for a maximum of 2 days, then switch over to flonase nasal spray.  Take a daily allergy medication (zyrtec, allegra, etc).  Saline drops and rinses will help clear out allergens in your nose that worsen congestion. For bronchitis, use albuterol inhaler every 4-6 hours, doxycyline and promethazine DM for cough and phlegm.   A viral upper respiratory infection and/or influenza typically lasts 7-10 days.  Symptoms resolve slowly.  However, a viral upper respiratory infection can also worsen and progress into pneumonia.  Monitor your symptoms. If your symptoms worsen, persist and you develop persistent fevers, chest pain, productive cough you should follow up with your primary care provider.

## 2017-06-30 NOTE — ED Provider Notes (Signed)
[facility]ER[facility] DEPART[facility] Provider Note   CSN: [id number] Arrival date & time: 06/30/17  7425     History   Chief Complaint Chief Complaint  Patient presents with  . Cough  . Chest Pain    HPI Nicole Cisneros is a 47 y.o. female with history of anemia, tobacco abuse, COPD is here for evaluation of chest tightness onset last night, constant. Chest tightness is aggravated with coughing and laying flat, no alleviating factors. Nonradiating. Nonpleuritic. Nonexertional. Associated with nasal congestion, sinus pressure, cough, sore throat, body aches, wheezing since Thursday. Had diarrhea yesterday, resolved. Unable to sleep last night due to chest tightness making her breathing difficult when laying flat. Her granddaughter has a mild cough. Not associated with nausea, vomiting, abdominal pain, changes in bowel movements, urinary symptoms. Has been taking over-the-counter nasal congestion and Sudafed with minimal relief.  HPI  Past Medical History:  Diagnosis Date  . Anemia   . Constipation   . COPD (chronic obstructive pulmonary disease) ([place])   . Cough   . Headache(784.0)   . Migraine   . Sore throat   . Weakness   . Weight loss     Patient Active Problem List   Diagnosis Date Noted  . Myalgias, multiple 10/23/2014  . Pain in joint, shoulder region 09/14/2014  . Left knee pain 06/21/2014  . GERD (gastroesophageal reflux disease) 03/14/2013  . Anemia 03/14/2013  . Smoking 03/14/2013  . Appendicitis 03/14/2011    Past Surgical History:  Procedure Laterality Date  . APPENDECTOMY  03/14/11  . LAPAROSCOPIC APPENDECTOMY  03/14/2011   Procedure: APPENDECTOMY LAPAROSCOPIC;  Surgeon: Shann Medal, MD;  Location: [facility];  Service: General;  Laterality: N/A;  . TUBAL LIGATION      OB History    No data available       Home Medications    Prior to Admission medications   Medication Sig Start Date End Date Taking? Authorizing Provider    albuterol (PROVENTIL HFA;VENTOLIN HFA) 108 (90 Base) MCG/ACT inhaler Inhale 1-2 puffs into the lungs every 6 (six) hours as needed for wheezing or shortness of breath. 06/30/17   Kinnie Feil, PA-C  doxycycline (VIBRAMYCIN) 100 MG capsule Take 1 capsule (100 mg total) by mouth 2 (two) times daily. 06/30/17   Kinnie Feil, PA-C  DULoxetine (CYMBALTA) 20 MG capsule Take 1 capsule (20 mg total) by mouth daily. Patient not taking: Reported on 12/07/2014 09/14/14   Gerda Diss, DO  Fe Fum-Vit C-Vit B12-FA (TRIGELS-F) 460-60-0.01-1 MG CAPS capsule Take 1 capsule by mouth daily. Patient not taking: Reported on 12/07/2014 10/23/14   Dickie La, MD  ferrous sulfate 325 (65 FE) MG tablet Take 1 tablet (325 mg total) by mouth daily with breakfast. Patient not taking: Reported on 12/20/2014 10/30/14   Lorayne Marek, MD  fluticasone (FLONASE) 50 MCG/ACT nasal spray Place 2 sprays into both nostrils daily. FOR NASAL CONGESTION 06/30/17   Kinnie Feil, PA-C  gabapentin (NEURONTIN) 300 MG capsule Take 1 capsule (300 mg total) by mouth at bedtime. Patient not taking: Reported on 12/20/2014 09/10/14   Melony Overly, MD  methocarbamol (ROBAXIN) 500 MG tablet Take 1 tablet (500 mg total) by mouth 3 (three) times daily between meals as needed. Patient not taking: Reported on 01/02/2017 05/05/16   Tanna Furry, MD  methocarbamol (ROBAXIN) 500 MG tablet Take 1 tablet (500 mg total) by mouth 3 (three) times daily between meals as needed. Patient not taking: Reported  on 01/02/2017 05/05/16   Tanna Furry, MD  naproxen (NAPROSYN) 500 MG tablet Take 1 tablet (500 mg total) by mouth 2 (two) times daily. Patient not taking: Reported on 01/02/2017 05/05/16   Tanna Furry, MD  naproxen (NAPROSYN) 500 MG tablet Take 1 tablet (500 mg total) by mouth 2 (two) times daily. Patient not taking: Reported on 01/02/2017 05/05/16   Tanna Furry, MD  omeprazole (PRILOSEC) 20 MG capsule Take 1 capsule (20 mg total) by mouth  daily. Patient not taking: Reported on 01/02/2017 11/29/14   Domenic Moras, PA-C  ondansetron (ZOFRAN ODT) 4 MG disintegrating tablet Take 1 tablet (4 mg total) by mouth every 8 (eight) hours as needed for nausea or vomiting. 01/02/17   Rancour, Annie Main, MD  ondansetron (ZOFRAN) 4 MG tablet Take 1 tablet (4 mg total) by mouth every 8 (eight) hours as needed for nausea or vomiting. Patient not taking: Reported on 01/02/2017 06/07/16   Virgel Manifold, MD  potassium chloride SA (K-DUR,KLOR-CON) 20 MEQ tablet Take 2 tablets (40 mEq total) by mouth 2 (two) times daily for 7 days. 06/30/17 07/07/17  Kinnie Feil, PA-C  promethazine-dextromethorphan (PROMETHAZINE-DM) 6.25-15 MG/5ML syrup Take 2.5 mLs by mouth 4 (four) times daily as needed for cough. 06/30/17   Kinnie Feil, PA-C    Family History Family History  Problem Relation Age of Onset  . Cancer Father        bone  . Cancer Sister        breast  . Heart disease Mother   . Hypertension Mother     Social History Social History   Tobacco Use  . Smoking status: Current Every Day Smoker    Packs/day: 1.00    Years: 12.00    Pack years: 12.00    Types: Cigarettes  . Smokeless tobacco: Never Used  . Tobacco comment: Smoking 1 ppd  Substance Use Topics  . Alcohol use: No    Alcohol/week: 0.0 oz  . Drug use: No     Allergies   Amoxicillin; Erythromycin; Percocet [oxycodone-acetaminophen]; and Vicodin [hydrocodone-acetaminophen]   Review of Systems Review of Systems  HENT: Positive for congestion, postnasal drip, sinus pressure and sore throat.   Respiratory: Positive for cough, chest tightness and wheezing.   Cardiovascular: Positive for chest pain ("tightness").  Gastrointestinal: Positive for diarrhea.  Musculoskeletal: Positive for myalgias.  All other systems reviewed and are negative.    Physical Exam Updated Vital Signs BP (!) 150/100   Pulse 65   Temp 99 F (37.2 C) (Oral)   Resp 16   LMP 06/12/2017   SpO2  100%   Physical Exam  Constitutional: She is oriented to person, place, and time. She appears well-developed and well-nourished. No distress.  Non toxic  HENT:  Head: Normocephalic and atraumatic.  Nose: Mucosal edema present.  Mouth/Throat: Posterior oropharyngeal erythema present. No oropharyngeal exudate.  Moderate mucosal edema. No sinus tenderness. Mild oropharyngeal and tonsillar erythema w/o exudates, asymmetry, hypertrophy. Uvula midline. Moist mucous membranes. No trismus. Normal phonation. TMs normal bilaterally.   Eyes: Conjunctivae and EOM are normal. Pupils are equal, round, and reactive to light.  Neck: Normal range of motion.  Cardiovascular: Normal rate, regular rhythm and intact distal pulses.  No murmur heard. No reproducible chest wall pain. 2+ DP and radial pulses bilaterally. No LE edema.   Pulmonary/Chest: Effort normal. No respiratory distress. She has wheezes.  Faint expiratory wheezing to lower lobes bilaterally. No crackles or decreased breath sounds.   Abdominal: Soft. Bowel  sounds are normal. There is no tenderness.  No G/R/R. No suprapubic or CVA tenderness.   Musculoskeletal: Normal range of motion. She exhibits no deformity.  Neurological: She is alert and oriented to person, place, and time.  Skin: Skin is warm and dry. Capillary refill takes less than 2 seconds.  Psychiatric: She has a normal mood and affect. Her behavior is normal. Judgment and thought content normal.  Nursing note and vitals reviewed.    ED Treatments / Results  Labs (all labs ordered are listed, but only abnormal results are displayed) Labs Reviewed  BASIC METABOLIC PANEL - Abnormal; Notable for the following components:      Result Value   Potassium 3.3 (*)    BUN <5 (*)    Calcium 8.7 (*)    All other components within normal limits  CBC - Abnormal; Notable for the following components:   RBC 3.81 (*)    Hemoglobin 8.5 (*)    HCT 28.2 (*)    MCV 74.0 (*)    MCH 22.3 (*)     RDW 20.8 (*)    All other components within normal limits  I-STAT TROPONIN, ED  I-STAT BETA HCG BLOOD, ED ([facility], [facility], [facility] ONLY)  I-STAT TROPONIN, ED    EKG  EKG Interpretation None       Radiology Dg Chest 2 View  Result Date: 06/30/2017 CLINICAL DATA:  Cough, congestion, sore throat and body aches. EXAM: CHEST - 2 VIEW COMPARISON:  PA and lateral chest 07/20/2016. FINDINGS: The lungs are clear. Heart size is normal. There is no pneumothorax or pleural effusion. No bony abnormality. IMPRESSION: Negative chest. Electronically Signed   By: Inge Rise M.D.   On: 06/30/2017 09:38    Procedures Procedures (including critical care time)  Medications Ordered in ED Medications  oxymetazoline (AFRIN) 0.05 % nasal spray 1 spray (1 spray Each Nare Given 06/30/17 1306)  ipratropium-albuterol (DUONEB) 0.5-2.5 (3) MG/3ML nebulizer solution 3 mL (3 mLs Nebulization Given 06/30/17 1231)     Initial Impression / Assessment and Plan / ED Course  I have reviewed the triage vital signs and the nursing notes.  Pertinent labs & imaging results that were available during my care of the patient were reviewed by me and considered in my medical decision making (see chart for details).  Clinical Course as of Jun 30 1332  Tue Jun 30, 2017  1214 Potassium: (!) 3.3 [CG]  1214 Hemoglobin: (!) 8.5 [CG]  1308 Re-evaluated pt after meds, feels much better . Discussed results of work up and plan to dc. She is agreeable.    [CG]    Clinical Course User Index [CG] Kinnie Feil, PA-C   Differential includes COPD exacerbation/bronchits. She has chest tightness with productive cough and other URI type symptoms, in setting of chronic tobacco abuse. Chest tightness is non exertional and non pleuritic. Suspicion is low for ACS, PE, dissection, PTX, endocarditis or pericarditis.   On exam, pt is non toxic appearing with normal breathing effort. No fever, no tachypnea, no tachycardia, normal oxygen  saturations. Only faint expiratory wheezing. Symptoms improved after ED tx. Pt ambulated in ED with normal O2 sats and improved symptoms.    CXR, labs and EKG reviewed and remarkable for mild hypokalemia and chronic anemia, stable. Delta trop negative. PERC negative. Likely viral URI leading to COPD exacerbation. Will discharge with doxycycline, albuterol and symptomatic tx. ED return precautions given. Discussed symptoms that would warrant return to ED for further reevaluation. Rx for  oral K given. Advised pt to re-stark iron supplements and f/u with PCP for anemia.   Final Clinical Impressions(s) / ED Diagnoses   Final diagnoses:  Bronchitis  Influenza-like illness  Anemia, unspecified type  Hypokalemia    ED Discharge Orders        Ordered    promethazine-dextromethorphan (PROMETHAZINE-DM) 6.25-15 MG/5ML syrup  4 times daily PRN     06/30/17 1328    doxycycline (VIBRAMYCIN) 100 MG capsule  2 times daily     06/30/17 1328    albuterol (PROVENTIL HFA;VENTOLIN HFA) 108 (90 Base) MCG/ACT inhaler  Every 6 hours PRN     06/30/17 1328    fluticasone (FLONASE) 50 MCG/ACT nasal spray  Daily     06/30/17 1328    potassium chloride SA (K-DUR,KLOR-CON) 20 MEQ tablet  2 times daily     06/30/17 1334       Arlean Hopping 06/30/17 1334    Carmin Muskrat, MD 07/02/17 1657

## 2018-01-31 ENCOUNTER — Emergency Department (HOSPITAL_COMMUNITY)
Admission: EM | Admit: 2018-01-31 | Discharge: 2018-01-31 | Disposition: A | Discharge status: Home / Self Care | Payer: No Typology Code available for payment source | Attending: Emergency Medicine | Admitting: Emergency Medicine

## 2018-01-31 ENCOUNTER — Encounter (HOSPITAL_COMMUNITY): Payer: Self-pay | Admitting: Emergency Medicine

## 2018-01-31 DIAGNOSIS — M25511 Pain in right shoulder: Secondary | ICD-10-CM | POA: Insufficient documentation

## 2018-01-31 DIAGNOSIS — M545 Low back pain: Secondary | ICD-10-CM | POA: Insufficient documentation

## 2018-01-31 DIAGNOSIS — G8929 Other chronic pain: Secondary | ICD-10-CM | POA: Insufficient documentation

## 2018-01-31 DIAGNOSIS — J449 Chronic obstructive pulmonary disease, unspecified: Secondary | ICD-10-CM | POA: Insufficient documentation

## 2018-01-31 DIAGNOSIS — F17210 Nicotine dependence, cigarettes, uncomplicated: Secondary | ICD-10-CM | POA: Insufficient documentation

## 2018-01-31 DIAGNOSIS — Z79899 Other long term (current) drug therapy: Secondary | ICD-10-CM | POA: Insufficient documentation

## 2018-01-31 DIAGNOSIS — M542 Cervicalgia: Secondary | ICD-10-CM | POA: Insufficient documentation

## 2018-01-31 DIAGNOSIS — M549 Dorsalgia, unspecified: Secondary | ICD-10-CM | POA: Insufficient documentation

## 2018-01-31 DIAGNOSIS — M25561 Pain in right knee: Secondary | ICD-10-CM | POA: Insufficient documentation

## 2018-01-31 MED ORDER — KETOROLAC TROMETHAMINE 15 MG/ML IJ SOLN
15.0000 mg | Freq: Once | INTRAMUSCULAR | Status: AC
  Administered 2018-01-31: 15 mg via INTRAMUSCULAR
  Filled 2018-01-31: qty 1

## 2018-01-31 MED ORDER — METHOCARBAMOL 500 MG PO TABS: 1000.0000 mg | ORAL_TABLET | Freq: Four times a day (QID) | ORAL | 0 refills | Status: DC

## 2018-01-31 MED ORDER — NAPROXEN 500 MG PO TABS: 500.0000 mg | ORAL_TABLET | Freq: Two times a day (BID) | ORAL | 0 refills | Status: DC

## 2018-01-31 NOTE — ED Notes (Signed)
States has hx of arthritis and nerve pain for the past week she has had back  Goes down her leg , aching all overs

## 2018-01-31 NOTE — Discharge Instructions (Signed)
Please read and follow all provided instructions.  Your diagnoses today include:  1. Other chronic back pain   2. Chronic pain of right knee     Tests performed today include:  Vital signs - see below for your results today  Medications prescribed:   Robaxin (methocarbamol) - muscle relaxer medication  DO NOT drive or perform any activities that require you to be awake and alert because this medicine can make you drowsy.    Naproxen - anti-inflammatory pain medication  Do not exceed 500mg  naproxen every 12 hours, take with food  You have been prescribed an anti-inflammatory medication or NSAID. Take with food. Take smallest effective dose for the shortest duration needed for your pain. Stop taking if you experience stomach pain or vomiting.   Take any prescribed medications only as directed.  Home care instructions:   Follow any educational materials contained in this packet  Please rest, use ice or heat on your back for the next several days  Do not lift, push, pull anything more than 10 pounds for the next week  Follow-up instructions: Please follow-up with your primary care provider in the next 1 week for further evaluation of your symptoms.   Return instructions:  SEEK IMMEDIATE MEDICAL ATTENTION IF YOU HAVE:  New numbness, tingling, weakness, or problem with the use of your arms or legs  Severe back pain not relieved with medications  Loss control of your bowels or bladder  Increasing pain in any areas of the body (such as chest or abdominal pain)  Shortness of breath, dizziness, or fainting.   Worsening nausea (feeling sick to your stomach), vomiting, fever, or sweats  Any other emergent concerns regarding your health   Additional Information:  Your vital signs today were: BP (!) 145/96 (BP Location: Right Arm)    Pulse 76    Temp 98.1 F (36.7 C) (Oral)    Resp 14    SpO2 100%  If your blood pressure (BP) was elevated above 135/85 this visit, please  have this repeated by your doctor within one month. --------------

## 2018-01-31 NOTE — ED Triage Notes (Signed)
Pt reports lower back pain radiating down both legs x2 days, states she took some naproxen without relief. Denies any loss of bowel or bladder.

## 2018-01-31 NOTE — ED Provider Notes (Signed)
[facility] EMERGENCY DEPARTMENT Provider Note   CSN: [id number] Arrival date & time: 01/31/18  0940     History   Chief Complaint Chief Complaint  Patient presents with  . Back Pain    HPI Nicole Cisneros is a 47 y.o. female.  Patient with history of chronic back pain, myalgias, joint pain including in the shoulders and knees --presents to the emergency department with acute on chronic pain in her back.  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  Her symptoms were worse when she got up this morning.  She took leftover naproxen without improvement.  She has aching pain throughout her entire back.  Pain radiates down into her right leg currently but states that sometimes it will "jump over" to her left leg.  She reports cracking popping in her knees and worst pain currently in her right knee without any acute swelling.  She denies any new injuries.  States that she does not currently have a primary care doctor; she has seen [facility] in the past. Patient denies warning symptoms of back pain including: fecal incontinence, urinary retention or overflow incontinence, night sweats, waking from sleep with back pain, unexplained fevers or weight loss, h/o cancer, IVDU, recent trauma.       Past Medical History:  Diagnosis Date  . Anemia   . Constipation   . COPD (chronic obstructive pulmonary disease) ([place])   . Cough   . Headache(784.0)   . Migraine   . Sore throat   . Weakness   . Weight loss     Patient Active Problem List   Diagnosis Date Noted  . Myalgias, multiple 10/23/2014  . Pain in joint, shoulder region 09/14/2014  . Left knee pain 06/21/2014  . GERD (gastroesophageal reflux disease) 03/14/2013  . Anemia 03/14/2013  . Smoking 03/14/2013  . Appendicitis 03/14/2011    Past Surgical History:  Procedure Laterality Date  . APPENDECTOMY  03/14/11  . LAPAROSCOPIC APPENDECTOMY  03/14/2011   Procedure: APPENDECTOMY  LAPAROSCOPIC;  Surgeon: Shann Medal, MD;  Location: [facility];  Service: General;  Laterality: N/A;  . TUBAL LIGATION       OB History   None      Home Medications    Prior to Admission medications   Medication Sig Start Date End Date Taking? Authorizing Provider  albuterol (PROVENTIL HFA;VENTOLIN HFA) 108 (90 Base) MCG/ACT inhaler Inhale 1-2 puffs into the lungs every 6 (six) hours as needed for wheezing or shortness of breath. 06/30/17   Kinnie Feil, PA-C  doxycycline (VIBRAMYCIN) 100 MG capsule Take 1 capsule (100 mg total) by mouth 2 (two) times daily. 06/30/17   Kinnie Feil, PA-C  fluticasone (FLONASE) 50 MCG/ACT nasal spray Place 2 sprays into both nostrils daily. FOR NASAL CONGESTION 06/30/17   Kinnie Feil, PA-C  methocarbamol (ROBAXIN) 500 MG tablet Take 2 tablets (1,000 mg total) by mouth 4 (four) times daily. 01/31/18   Carlisle Cater, PA-C  naproxen (NAPROSYN) 500 MG tablet Take 1 tablet (500 mg total) by mouth 2 (two) times daily. 01/31/18   Carlisle Cater, PA-C  potassium chloride SA (K-DUR,KLOR-CON) 20 MEQ tablet Take 2 tablets (40 mEq total) by mouth 2 (two) times daily for 7 days. 06/30/17 07/07/17  Kinnie Feil, PA-C  promethazine-dextromethorphan (PROMETHAZINE-DM) 6.25-15 MG/5ML syrup Take 2.5 mLs by mouth 4 (four) times daily as needed for cough. 06/30/17   Kinnie Feil, PA-C    Family History Family  History  Problem Relation Age of Onset  . Cancer Father        bone  . Cancer Sister        breast  . Heart disease Mother   . Hypertension Mother     Social History Social History   Tobacco Use  . Smoking status: Current Every Day Smoker    Packs/day: 1.00    Years: 12.00    Pack years: 12.00    Types: Cigarettes  . Smokeless tobacco: Never Used  . Tobacco comment: Smoking 1 ppd  Substance Use Topics  . Alcohol use: No    Alcohol/week: 0.0 standard drinks  . Drug use: No     Allergies   Amoxicillin; Erythromycin; Percocet  [oxycodone-acetaminophen]; and Vicodin [hydrocodone-acetaminophen]   Review of Systems Review of Systems  Constitutional: Negative for fever and unexpected weight change.  Gastrointestinal: Negative for constipation.       Negative for fecal incontinence.   Genitourinary: Negative for dysuria, flank pain and hematuria.       Negative for urinary incontinence or retention.  Musculoskeletal: Positive for back pain.  Neurological: Negative for weakness and numbness.       Denies saddle paresthesias.     Physical Exam Updated Vital Signs BP (!) 145/96 (BP Location: Right Arm)   Pulse 76   Temp 98.1 F (36.7 C) (Oral)   Resp 14   SpO2 100%   Physical Exam  Constitutional: She appears well-developed and well-nourished.  HENT:  Head: Normocephalic and atraumatic.  Eyes: Conjunctivae are normal.  Neck: Normal range of motion. Neck supple.  Pulmonary/Chest: Effort normal.  Abdominal: Soft. There is no tenderness. There is no CVA tenderness.  Musculoskeletal:       Right shoulder: She exhibits tenderness. She exhibits normal range of motion and no bony tenderness.       Left shoulder: She exhibits normal range of motion, no tenderness and no bony tenderness.       Right hip: Normal.       Left hip: Normal.       Right knee: Tenderness found. No medial joint line and no lateral joint line tenderness noted.       Left knee: No tenderness found. No medial joint line tenderness noted.       Cervical back: She exhibits tenderness. She exhibits normal range of motion and no bony tenderness.       Thoracic back: She exhibits tenderness. She exhibits normal range of motion and no bony tenderness.       Lumbar back: She exhibits tenderness. She exhibits normal range of motion and no bony tenderness.       Back:  No step-off noted with palpation of spine.   Neurological: She is alert. She has normal strength and normal reflexes. No sensory deficit.  5/5 strength in entire lower extremities  bilaterally. No sensation deficit.  Patient can get up from the exam chair, walk a few steps, and sit back down without any assistance.  No foot drop noted.  Skin: Skin is warm and dry. No rash noted.  Psychiatric: She has a normal mood and affect.  Nursing note and vitals reviewed.    ED Treatments / Results  Labs (all labs ordered are listed, but only abnormal results are displayed) Labs Reviewed - No data to display  EKG None  Radiology No results found.  Procedures Procedures (including critical care time)  Medications Ordered in ED Medications  ketorolac (TORADOL) 15 MG/ML injection 15  mg (has no administration in time range)     Initial Impression / Assessment and Plan / ED Course  I have reviewed the triage vital signs and the nursing notes.  Pertinent labs & imaging results that were available during my care of the patient were reviewed by me and considered in my medical decision making (see chart for details).     Patient seen and examined.   Vital signs reviewed and are as follows: BP (!) 145/96 (BP Location: Right Arm)   Pulse 76   Temp 98.1 F (36.7 C) (Oral)   Resp 14   SpO2 100%   Patient with chronic pain in her back with radiation to her legs.  Pain seems very musculoskeletal in nature.  Cannot entirely rule out an element of radicular pain especially down into her her right leg at the current time.  She is ambulatory with some limping and does not currently have any red flags.  We will continue conservative measures including NSAIDs, Robaxin.  Discussed use of ice and heat on sore areas.  Encouraged establishing care with a primary care doctor.  Referrals given.  IM Toradol given for symptom control prior to discharge.  Final Clinical Impressions(s) / ED Diagnoses   Final diagnoses:  Other chronic back pain  Chronic pain of right knee   Patient with back pain. Chronic in nature. No neurological deficits. Patient is ambulatory. No warning symptoms  of back pain including: fecal incontinence, urinary retention or overflow incontinence, night sweats, waking from sleep with back pain, unexplained fevers or weight loss, h/o cancer, IVDU, recent trauma. No concern for cauda equina, epidural abscess, or other serious cause of back pain. Conservative measures such as rest, ice/heat and pain medicine indicated with PCP follow-up if no improvement with conservative management.    ED Discharge Orders         Ordered    methocarbamol (ROBAXIN) 500 MG tablet  4 times daily     01/31/18 1028    naproxen (NAPROSYN) 500 MG tablet  2 times daily     01/31/18 1028           Carlisle Cater, PA-C 01/31/18 1033    Quintella Reichert, MD 01/31/18 1100

## 2018-06-06 ENCOUNTER — Emergency Department (HOSPITAL_COMMUNITY)
Admission: EM | Admit: 2018-06-06 | Discharge: 2018-06-06 | Disposition: A | Discharge status: Home / Self Care | Payer: Self-pay | Attending: Emergency Medicine | Admitting: Emergency Medicine

## 2018-06-06 ENCOUNTER — Other Ambulatory Visit: Payer: Self-pay

## 2018-06-06 ENCOUNTER — Emergency Department (HOSPITAL_COMMUNITY): Payer: Self-pay

## 2018-06-06 ENCOUNTER — Encounter (HOSPITAL_COMMUNITY): Payer: Self-pay

## 2018-06-06 DIAGNOSIS — J449 Chronic obstructive pulmonary disease, unspecified: Secondary | ICD-10-CM | POA: Insufficient documentation

## 2018-06-06 DIAGNOSIS — Z79899 Other long term (current) drug therapy: Secondary | ICD-10-CM | POA: Insufficient documentation

## 2018-06-06 DIAGNOSIS — R112 Nausea with vomiting, unspecified: Secondary | ICD-10-CM | POA: Insufficient documentation

## 2018-06-06 DIAGNOSIS — F17210 Nicotine dependence, cigarettes, uncomplicated: Secondary | ICD-10-CM | POA: Insufficient documentation

## 2018-06-06 DIAGNOSIS — R197 Diarrhea, unspecified: Secondary | ICD-10-CM | POA: Insufficient documentation

## 2018-06-06 LAB — CBC WITH DIFFERENTIAL/PLATELET
Abs Immature Granulocytes: 0.03 10*3/uL (ref 0.00–0.07)
Basophils Absolute: 0 10*3/uL (ref 0.0–0.1)
Basophils Relative: 1 %
Eosinophils Absolute: 0 10*3/uL (ref 0.0–0.5)
Eosinophils Relative: 0 %
HCT: 30.6 % — ABNORMAL LOW (ref 36.0–46.0)
Hemoglobin: 8.9 g/dL — ABNORMAL LOW (ref 12.0–15.0)
Immature Granulocytes: 1 %
Lymphocytes Relative: 6 %
Lymphs Abs: 0.4 10*3/uL — ABNORMAL LOW (ref 0.7–4.0)
MCH: 22.6 pg — ABNORMAL LOW (ref 26.0–34.0)
MCHC: 29.1 g/dL — ABNORMAL LOW (ref 30.0–36.0)
MCV: 77.9 fL — ABNORMAL LOW (ref 80.0–100.0)
Monocytes Absolute: 0.2 10*3/uL (ref 0.1–1.0)
Monocytes Relative: 4 %
Neutro Abs: 5.3 10*3/uL (ref 1.7–7.7)
Neutrophils Relative %: 88 %
Platelets: 199 10*3/uL (ref 150–400)
RBC: 3.93 MIL/uL (ref 3.87–5.11)
RDW: 19.4 % — ABNORMAL HIGH (ref 11.5–15.5)
WBC: 6 10*3/uL (ref 4.0–10.5)
nRBC: 0 % (ref 0.0–0.2)

## 2018-06-06 LAB — URINALYSIS, ROUTINE W REFLEX MICROSCOPIC
Bilirubin Urine: NEGATIVE
Glucose, UA: NEGATIVE mg/dL
Hgb urine dipstick: NEGATIVE
Ketones, ur: NEGATIVE mg/dL
Nitrite: NEGATIVE
Protein, ur: NEGATIVE mg/dL
Specific Gravity, Urine: 1.012 (ref 1.005–1.030)
pH: 7 (ref 5.0–8.0)

## 2018-06-06 LAB — LIPASE, BLOOD: Lipase: 26 U/L (ref 11–51)

## 2018-06-06 LAB — COMPREHENSIVE METABOLIC PANEL
ALT: 15 U/L (ref 0–44)
AST: 21 U/L (ref 15–41)
Albumin: 3.8 g/dL (ref 3.5–5.0)
Alkaline Phosphatase: 45 U/L (ref 38–126)
Anion gap: 5 (ref 5–15)
BUN: 5 mg/dL — ABNORMAL LOW (ref 6–20)
CO2: 26 mmol/L (ref 22–32)
Calcium: 9.2 mg/dL (ref 8.9–10.3)
Chloride: 106 mmol/L (ref 98–111)
Creatinine, Ser: 0.79 mg/dL (ref 0.44–1.00)
GFR calc Af Amer: >60 mL/min (ref >60)
GFR calc non Af Amer: >60 mL/min (ref >60)
Glucose, Bld: 91 mg/dL (ref 70–99)
Potassium: 3.8 mmol/L (ref 3.5–5.1)
Sodium: 137 mmol/L (ref 135–145)
Total Bilirubin: 0.5 mg/dL (ref 0.3–1.2)
Total Protein: 7.4 g/dL (ref 6.5–8.1)

## 2018-06-06 LAB — I-STAT BETA HCG BLOOD, ED (MC, WL, AP ONLY): I-stat hCG, quantitative: <5 m[IU]/mL (ref <5)

## 2018-06-06 MED ORDER — ONDANSETRON 4 MG PO TBDP: ORAL_TABLET | ORAL | 0 refills | Status: DC

## 2018-06-06 MED ORDER — KETOROLAC TROMETHAMINE 30 MG/ML IJ SOLN
30.0000 mg | Freq: Once | INTRAMUSCULAR | Status: AC
  Administered 2018-06-06: 30 mg via INTRAVENOUS
  Filled 2018-06-06: qty 1

## 2018-06-06 MED ORDER — LOPERAMIDE HCL 2 MG PO CAPS
4.0000 mg | ORAL_CAPSULE | Freq: Once | ORAL | Status: AC
  Administered 2018-06-06: 4 mg via ORAL
  Filled 2018-06-06: qty 2

## 2018-06-06 MED ORDER — BENZONATATE 100 MG PO CAPS: 100.0000 mg | ORAL_CAPSULE | Freq: Three times a day (TID) | ORAL | 0 refills | Status: DC

## 2018-06-06 MED ORDER — SODIUM CHLORIDE 0.9 % IV BOLUS
1000.0000 mL | Freq: Once | INTRAVENOUS | Status: AC
  Administered 2018-06-06: 1000 mL via INTRAVENOUS

## 2018-06-06 NOTE — ED Provider Notes (Signed)
[facility] EMERGENCY DEPARTMENT Provider Note   CSN: [id number] Arrival date & time: 06/06/18  1642    History   Chief Complaint Chief Complaint  Patient presents with  . Abdominal Pain    HPI Nicole Cisneros is a 48 y.o. female history of COPD, who presented with diarrhea, abdominal cramps, chills.  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  She states that since last night she has been having some epigastric pain as well as nausea and vomiting and diarrhea.  She states that she was unable to keep everything down and has some liquid stools.  She had some chills but denies any fevers.  She states that some the residents at the nursing facility appears to be sick as well.  She denies any recent travel or eating uncooked meat.      The history is provided by the patient.    Past Medical History:  Diagnosis Date  . Anemia   . Constipation   . COPD (chronic obstructive pulmonary disease) ([place])   . Cough   . Headache(784.0)   . Migraine   . Sore throat   . Weakness   . Weight loss     Patient Active Problem List   Diagnosis Date Noted  . Myalgias, multiple 10/23/2014  . Pain in joint, shoulder region 09/14/2014  . Left knee pain 06/21/2014  . GERD (gastroesophageal reflux disease) 03/14/2013  . Anemia 03/14/2013  . Smoking 03/14/2013  . Appendicitis 03/14/2011    Past Surgical History:  Procedure Laterality Date  . APPENDECTOMY  03/14/11  . LAPAROSCOPIC APPENDECTOMY  03/14/2011   Procedure: APPENDECTOMY LAPAROSCOPIC;  Surgeon: Shann Medal, MD;  Location: [facility];  Service: General;  Laterality: N/A;  . TUBAL LIGATION       OB History   No obstetric history on file.      Home Medications    Prior to Admission medications   Medication Sig Start Date End Date Taking? Authorizing Provider  albuterol (PROVENTIL HFA;VENTOLIN HFA) 108 (90 Base) MCG/ACT inhaler Inhale 1-2 puffs into the lungs every 6 (six) hours as  needed for wheezing or shortness of breath. 06/30/17   Kinnie Feil, PA-C  doxycycline (VIBRAMYCIN) 100 MG capsule Take 1 capsule (100 mg total) by mouth 2 (two) times daily. 06/30/17   Kinnie Feil, PA-C  fluticasone (FLONASE) 50 MCG/ACT nasal spray Place 2 sprays into both nostrils daily. FOR NASAL CONGESTION 06/30/17   Kinnie Feil, PA-C  methocarbamol (ROBAXIN) 500 MG tablet Take 2 tablets (1,000 mg total) by mouth 4 (four) times daily. 01/31/18   Carlisle Cater, PA-C  naproxen (NAPROSYN) 500 MG tablet Take 1 tablet (500 mg total) by mouth 2 (two) times daily. 01/31/18   Carlisle Cater, PA-C  potassium chloride SA (K-DUR,KLOR-CON) 20 MEQ tablet Take 2 tablets (40 mEq total) by mouth 2 (two) times daily for 7 days. 06/30/17 07/07/17  Kinnie Feil, PA-C  promethazine-dextromethorphan (PROMETHAZINE-DM) 6.25-15 MG/5ML syrup Take 2.5 mLs by mouth 4 (four) times daily as needed for cough. 06/30/17   Kinnie Feil, PA-C    Family History Family History  Problem Relation Age of Onset  . Cancer Father        bone  . Cancer Sister        breast  . Heart disease Mother   . Hypertension Mother     Social History Social History   Tobacco Use  . Smoking status: Current Every  Day Smoker    Packs/day: 1.00    Years: 12.00    Pack years: 12.00    Types: Cigarettes  . Smokeless tobacco: Never Used  . Tobacco comment: Smoking 1 ppd  Substance Use Topics  . Alcohol use: No    Alcohol/week: 0.0 standard drinks  . Drug use: No     Allergies   Amoxicillin; Erythromycin; Percocet [oxycodone-acetaminophen]; and Vicodin [hydrocodone-acetaminophen]   Review of Systems Review of Systems  Gastrointestinal: Positive for abdominal pain.  All other systems reviewed and are negative.    Physical Exam Updated Vital Signs BP (!) 147/85   Pulse 79   Temp 100 F (37.8 C) (Oral)   Resp 18   Ht 5\' 3"  (1.6 m)   Wt 55.8 kg   LMP 05/15/2018   SpO2 99%   BMI 21.79 kg/m    Physical Exam Vitals signs and nursing note reviewed.  Constitutional:      Appearance: She is well-developed.  HENT:     Head: Normocephalic.     Mouth/Throat:     Pharynx: Oropharynx is clear.     Comments: MM slightly dry  Eyes:     Extraocular Movements: Extraocular movements intact.  Cardiovascular:     Rate and Rhythm: Normal rate and regular rhythm.  Pulmonary:     Effort: Pulmonary effort is normal.     Breath sounds: Normal breath sounds.  Abdominal:     General: Abdomen is flat. Bowel sounds are normal.     Palpations: Abdomen is soft.  Skin:    General: Skin is warm.     Capillary Refill: Capillary refill takes less than 2 seconds.  Neurological:     General: No focal deficit present.     Mental Status: She is alert.      ED Treatments / Results  Labs (all labs ordered are listed, but only abnormal results are displayed) Labs Reviewed  CBC WITH DIFFERENTIAL/PLATELET - Abnormal; Notable for the following components:      Result Value   Hemoglobin 8.9 (*)    HCT 30.6 (*)    MCV 77.9 (*)    MCH 22.6 (*)    MCHC 29.1 (*)    RDW 19.4 (*)    Lymphs Abs 0.4 (*)    All other components within normal limits  COMPREHENSIVE METABOLIC PANEL - Abnormal; Notable for the following components:   BUN 5 (*)    All other components within normal limits  URINALYSIS, ROUTINE W REFLEX MICROSCOPIC - Abnormal; Notable for the following components:   Color, Urine STRAW (*)    APPearance HAZY (*)    Leukocytes,Ua TRACE (*)    Bacteria, UA RARE (*)    All other components within normal limits  LIPASE, BLOOD  I-STAT BETA HCG BLOOD, ED ([facility], [facility], [facility] ONLY)    EKG None  Radiology Dg Abd Acute 2+v W 1v Chest  Result Date: 06/06/2018 CLINICAL DATA:  Abdominal pain, vomiting EXAM: DG ABDOMEN ACUTE W/ 1V CHEST COMPARISON:  06/30/2016 FINDINGS: Mild hyperinflation of the lungs. No confluent opacities or effusions. Heart is upper limits normal in size. There is normal bowel gas  pattern. No free air. No organomegaly or suspicious calcification. No acute bony abnormality. Calcified phleboliths in the left pelvis. IMPRESSION: No acute findings.  No obstruction or free air. Mild hyperinflation of the lungs. Electronically Signed   By: Rolm Baptise M.D.   On: 06/06/2018 18:22    Procedures Procedures (including critical care time)  Medications  Ordered in ED Medications  sodium chloride 0.9 % bolus 1,000 mL (1,000 mLs Intravenous New Bag/Given 06/06/18 1722)  ketorolac (TORADOL) 30 MG/ML injection 30 mg (30 mg Intravenous Given 06/06/18 1745)  loperamide (IMODIUM) capsule 4 mg (4 mg Oral Given 06/06/18 1745)     Initial Impression / Assessment and Plan / ED Course  I have reviewed the triage vital signs and the nursing notes.  Pertinent labs & imaging results that were available during my care of the patient were reviewed by me and considered in my medical decision making (see chart for details).       Brock A Sheehy is a 48 y.o. female here presenting with abdominal pain, nausea and vomiting and diarrhea.  Likely viral gastroenteritis. Will check labs, UA.   7:43 PM Labs are unremarkable. Patient tolerated PO. Stable for discharge.    Final Clinical Impressions(s) / ED Diagnoses   Final diagnoses:  None    ED Discharge Orders    None       Drenda Freeze, MD 06/06/18 [phone]

## 2018-06-06 NOTE — ED Triage Notes (Signed)
Pt reports N,V,D and epigastric and midline abdominal pain since last night.

## 2018-06-06 NOTE — ED Notes (Signed)
Pt reminded of the need for urine.  

## 2018-06-06 NOTE — ED Notes (Signed)
E-signature not available, verbalized understanding of DC instructions and prescriptions.  

## 2018-06-06 NOTE — ED Notes (Signed)
Bedside commode at bedside for pt.

## 2018-06-06 NOTE — ED Notes (Signed)
EDP at bedside  

## 2018-06-06 NOTE — Discharge Instructions (Signed)
Stay hydrated.   Take zofran for nausea.   Rest for 2 days. You likely have a stomach virus   Take tessalon pearls for cough   See your doctor  Return to ER if you have worse abdominal pain, vomiting, fever

## 2019-03-21 ENCOUNTER — Other Ambulatory Visit: Payer: Self-pay

## 2019-03-21 ENCOUNTER — Encounter (HOSPITAL_COMMUNITY): Payer: Self-pay

## 2019-03-21 ENCOUNTER — Ambulatory Visit (HOSPITAL_COMMUNITY)
Admission: EM | Admit: 2019-03-21 | Discharge: 2019-03-21 | Disposition: A | Discharge status: Home / Self Care | Payer: PRIVATE HEALTH INSURANCE | Attending: Family Medicine | Admitting: Family Medicine

## 2019-03-21 DIAGNOSIS — R52 Pain, unspecified: Secondary | ICD-10-CM | POA: Insufficient documentation

## 2019-03-21 DIAGNOSIS — D649 Anemia, unspecified: Secondary | ICD-10-CM | POA: Insufficient documentation

## 2019-03-21 DIAGNOSIS — R197 Diarrhea, unspecified: Secondary | ICD-10-CM | POA: Insufficient documentation

## 2019-03-21 DIAGNOSIS — R05 Cough: Secondary | ICD-10-CM

## 2019-03-21 DIAGNOSIS — R112 Nausea with vomiting, unspecified: Secondary | ICD-10-CM | POA: Diagnosis not present

## 2019-03-21 DIAGNOSIS — J449 Chronic obstructive pulmonary disease, unspecified: Secondary | ICD-10-CM | POA: Diagnosis not present

## 2019-03-21 DIAGNOSIS — J029 Acute pharyngitis, unspecified: Secondary | ICD-10-CM | POA: Insufficient documentation

## 2019-03-21 DIAGNOSIS — Z20828 Contact with and (suspected) exposure to other viral communicable diseases: Secondary | ICD-10-CM | POA: Diagnosis not present

## 2019-03-21 DIAGNOSIS — R059 Cough, unspecified: Secondary | ICD-10-CM

## 2019-03-21 DIAGNOSIS — Z79899 Other long term (current) drug therapy: Secondary | ICD-10-CM | POA: Diagnosis not present

## 2019-03-21 DIAGNOSIS — F17210 Nicotine dependence, cigarettes, uncomplicated: Secondary | ICD-10-CM | POA: Insufficient documentation

## 2019-03-21 DIAGNOSIS — R531 Weakness: Secondary | ICD-10-CM | POA: Insufficient documentation

## 2019-03-21 DIAGNOSIS — R0789 Other chest pain: Secondary | ICD-10-CM

## 2019-03-21 MED ORDER — NAPROXEN 500 MG PO TABS: 500.0000 mg | ORAL_TABLET | Freq: Two times a day (BID) | ORAL | 0 refills | Status: DC | PRN

## 2019-03-21 MED ORDER — BENZONATATE 100 MG PO CAPS: 100.0000 mg | ORAL_CAPSULE | Freq: Three times a day (TID) | ORAL | 0 refills | Status: DC | PRN

## 2019-03-21 MED ORDER — ALBUTEROL SULFATE HFA 108 (90 BASE) MCG/ACT IN AERS: 2.0000 | INHALATION_SPRAY | Freq: Four times a day (QID) | RESPIRATORY_TRACT | 1 refills | Status: DC | PRN

## 2019-03-21 NOTE — Discharge Instructions (Addendum)
Recommend use Albuterol inhaler 2 puffs every 4 to 6 hours as needed for shortness of breath or chest tightness. May take Tessalon cough pills 1 every 8 hours as needed. Take Naproxen 500mg  twice a day as needed for pain/headache. Continue to push fluids and eat small bland foods. Rest. Stay at home. Follow-up pending COVID 19 test results.

## 2019-03-21 NOTE — ED Provider Notes (Signed)
[facility]    CSN: [id number] Arrival date & time: 03/21/19  1053      History   Chief Complaint Chief Complaint  Patient presents with  . Cough    HPI Nicole Cisneros is a 48 y.o. female.   48 year old female presents with nasal congestion, sore throat, headache and body aches for 5 to 6 days. Then started developing a cough and shortness of breath 2 days ago along with diarrhea. No distinct fever but chills, decreased appetite, nausea and vomiting. Last vomited yesterday. Feels very weak. Today having more chest tightness. Has history of COPD but not currently on any treatment. Currently smokes cigarettes daily. Has taken Nyquil and cough drops with minimal relief. Works at [employer] in [employer] [place] and is exposed daily to positive COVID 19 patients- gets tested every Tuesday. Needs work note if needs to stay at home.   The history is provided by the patient.    Past Medical History:  Diagnosis Date  . Anemia   . Constipation   . COPD (chronic obstructive pulmonary disease) ([place])   . Cough   . Headache(784.0)   . Migraine   . Sore throat   . Weakness   . Weight loss     Patient Active Problem List   Diagnosis Date Noted  . Myalgias, multiple 10/23/2014  . Pain in joint, shoulder region 09/14/2014  . Left knee pain 06/21/2014  . GERD (gastroesophageal reflux disease) 03/14/2013  . Anemia 03/14/2013  . Smoking 03/14/2013  . Appendicitis 03/14/2011    Past Surgical History:  Procedure Laterality Date  . APPENDECTOMY  03/14/11  . LAPAROSCOPIC APPENDECTOMY  03/14/2011   Procedure: APPENDECTOMY LAPAROSCOPIC;  Surgeon: Shann Medal, MD;  Location: [facility];  Service: General;  Laterality: N/A;  . TUBAL LIGATION      OB History   No obstetric history on file.      Home Medications    Prior to Admission medications   Medication Sig Start Date End Date Taking? Authorizing Provider  albuterol (VENTOLIN HFA) 108 (90 Base) MCG/ACT  inhaler Inhale 2 puffs into the lungs every 6 (six) hours as needed for wheezing or shortness of breath. 03/21/19   Katy Apo, NP  benzonatate (TESSALON) 100 MG capsule Take 1 capsule (100 mg total) by mouth 3 (three) times daily as needed for cough. 03/21/19   Katy Apo, NP  naproxen (NAPROSYN) 500 MG tablet Take 1 tablet (500 mg total) by mouth 2 (two) times daily as needed for moderate pain or headache. 03/21/19   Katy Apo, NP  potassium chloride SA (K-DUR,KLOR-CON) 20 MEQ tablet Take 2 tablets (40 mEq total) by mouth 2 (two) times daily for 7 days. 06/30/17 07/07/17  Kinnie Feil, PA-C  fluticasone (FLONASE) 50 MCG/ACT nasal spray Place 2 sprays into both nostrils daily. FOR NASAL CONGESTION 06/30/17 03/21/19  Kinnie Feil, PA-C    Family History Family History  Problem Relation Age of Onset  . Cancer Father        bone  . Cancer Sister        breast  . Heart disease Mother   . Hypertension Mother     Social History Social History   Tobacco Use  . Smoking status: Current Every Day Smoker    Packs/day: 1.00    Years: 12.00    Pack years: 12.00    Types: Cigarettes  . Smokeless tobacco: Never Used  . Tobacco comment: Smoking  1 ppd  Substance Use Topics  . Alcohol use: No    Alcohol/week: 0.0 standard drinks  . Drug use: No     Allergies   Amoxicillin, Erythromycin, Percocet [oxycodone-acetaminophen], and Vicodin [hydrocodone-acetaminophen]   Review of Systems Review of Systems  Constitutional: Positive for activity change, appetite change, chills and fatigue. Negative for diaphoresis. Fever: uncertain.  HENT: Positive for congestion, postnasal drip, sinus pressure, sneezing and sore throat. Negative for ear discharge, ear pain, facial swelling, mouth sores, nosebleeds, rhinorrhea, sinus pain and trouble swallowing.   Eyes: Negative for pain, discharge, redness and itching.  Respiratory: Positive for cough, chest tightness and shortness of  breath. Negative for stridor.   Cardiovascular: Negative for chest pain and palpitations.  Gastrointestinal: Positive for diarrhea, nausea and vomiting. Negative for abdominal pain and blood in stool.  Musculoskeletal: Positive for arthralgias and myalgias. Negative for neck pain and neck stiffness.  Skin: Negative for color change, rash and wound.  Allergic/Immunologic: Positive for environmental allergies. Negative for immunocompromised state.  Neurological: Positive for headaches. Negative for dizziness, tremors, seizures, syncope, weakness, light-headedness and numbness.  Hematological: Negative for adenopathy. Does not bruise/bleed easily.     Physical Exam Triage Vital Signs ED Triage Vitals  Enc Vitals Group     BP 03/21/19 1218 (!) 142/91     Pulse Rate 03/21/19 1218 61     Resp 03/21/19 1218 17     Temp 03/21/19 1218 98.2 F (36.8 C)     Temp Source 03/21/19 1218 Oral     SpO2 03/21/19 1218 100 %     Weight --      Height --      Head Circumference --      Peak Flow --      Pain Score 03/21/19 1215 8     Pain Loc --      Pain Edu? --      Excl. in [place]? --    No data found.  Updated Vital Signs BP (!) 142/91 (BP Location: Left Arm)   Pulse 61   Temp 98.2 F (36.8 C) (Oral)   Resp 17   LMP 03/07/2019   SpO2 100%   Visual Acuity Right Eye Distance:   Left Eye Distance:   Bilateral Distance:    Right Eye Near:   Left Eye Near:    Bilateral Near:     Physical Exam Vitals signs and nursing note reviewed.  Constitutional:      General: She is awake. She is not in acute distress.    Appearance: She is well-developed and well-groomed. She is ill-appearing.     Comments: Patient sitting comfortably on exam table in no acute distress but appears ill and tired.   HENT:     Head: Normocephalic and atraumatic.     Right Ear: Hearing, tympanic membrane, ear canal and external ear normal.     Left Ear: Hearing, tympanic membrane, ear canal and external ear normal.      Nose: Mucosal edema and congestion present.     Right Sinus: Maxillary sinus tenderness present. No frontal sinus tenderness.     Left Sinus: Maxillary sinus tenderness present. No frontal sinus tenderness.     Mouth/Throat:     Lips: Pink.     Mouth: Mucous membranes are moist.     Pharynx: Oropharynx is clear. Uvula midline. Posterior oropharyngeal erythema present. No pharyngeal swelling, oropharyngeal exudate or uvula swelling.  Eyes:     Extraocular Movements: Extraocular movements intact.  Conjunctiva/sclera: Conjunctivae normal.  Neck:     Musculoskeletal: Normal range of motion and neck supple. No neck rigidity.  Cardiovascular:     Rate and Rhythm: Normal rate and regular rhythm.     Heart sounds: Normal heart sounds. No murmur.  Pulmonary:     Effort: Pulmonary effort is normal. No respiratory distress.     Breath sounds: Normal air entry. No stridor or decreased air movement. Examination of the right-upper field reveals decreased breath sounds and wheezing. Examination of the left-upper field reveals decreased breath sounds and wheezing. Decreased breath sounds and wheezing present. No rhonchi or rales.  Abdominal:     General: Abdomen is flat. Bowel sounds are normal. There is no distension.     Palpations: Abdomen is soft.     Tenderness: There is generalized abdominal tenderness. There is no right CVA tenderness or left CVA tenderness.  Musculoskeletal: Normal range of motion.  Lymphadenopathy:     Cervical: No cervical adenopathy.  Skin:    General: Skin is warm and dry.     Capillary Refill: Capillary refill takes less than 2 seconds.     Findings: No rash.  Neurological:     General: No focal deficit present.     Mental Status: She is alert and oriented to person, place, and time.  Psychiatric:        Mood and Affect: Mood normal.        Behavior: Behavior normal. Behavior is cooperative.        Thought Content: Thought content normal.        Judgment:  Judgment normal.      UC Treatments / Results  Labs (all labs ordered are listed, but only abnormal results are displayed) Labs Reviewed  NOVEL CORONAVIRUS, NAA (HOSP ORDER, SEND-OUT TO REF LAB; TAT 18-24 HRS)    EKG Unable to provide result of EKG in Epic under procedures.Marland Kitchen.(no link to provide results) Results of EKG- Pulse rate 59- sinus bradycardia. Normal PR, QRS interval and duration. No ST changes. No distinct abnormalities detected. Essentially normal EKG with slight bradycardia.    Radiology No results found.  Procedures Procedures (including critical care time)  Medications Ordered in UC Medications - No data to display  Initial Impression / Assessment and Plan / UC Course  I have reviewed the triage vital signs and the nursing notes.  Pertinent labs & imaging results that were available during my care of the patient were reviewed by me and considered in my medical decision making (see chart for details).    Reviewed with patient that she may have COVID 19 or another viral illness. Will perform send-out COVID 19 test for increased accuracy. Reviewed no distinct abnormalities seen on her EKG. Recommend start Albuterol 2 puffs every 4 to 6 hours as needed for cough or chest tightness. May use Tessalon cough pills 1 every 8 hours as needed. Take Naproxen 500mg  twice a day as needed for headache and body aches. Continue to push fluids. Able to keep down fluids and bland foods today. Advance diet as tolerated. Stay at home and rest. Note written for work. She does not need to repeat COVID 19 testing tomorrow at work. Follow-up pending our COVID 19 test result.  Final Clinical Impressions(s) / UC Diagnoses   Final diagnoses:  Cough  Acute sore throat  Diarrhea, unspecified type  Body aches  Chest tightness     Discharge Instructions     Recommend use Albuterol inhaler 2 puffs every 4 to  6 hours as needed for shortness of breath or chest tightness. May take Tessalon  cough pills 1 every 8 hours as needed. Take Naproxen 500mg  twice a day as needed for pain/headache. Continue to push fluids and eat small bland foods. Rest. Stay at home. Follow-up pending COVID 19 test results.     ED Prescriptions    Medication Sig Dispense Auth. Provider   albuterol (VENTOLIN HFA) 108 (90 Base) MCG/ACT inhaler Inhale 2 puffs into the lungs every 6 (six) hours as needed for wheezing or shortness of breath. 18 g Katy Apo, NP   benzonatate (TESSALON) 100 MG capsule Take 1 capsule (100 mg total) by mouth 3 (three) times daily as needed for cough. 21 capsule Katy Apo, NP   naproxen (NAPROSYN) 500 MG tablet Take 1 tablet (500 mg total) by mouth 2 (two) times daily as needed for moderate pain or headache. 20 tablet Anderson Coppock, Nicholes Stairs, NP     PDMP not reviewed this encounter.   Katy Apo, NP 03/22/19 [phone]

## 2019-03-21 NOTE — ED Triage Notes (Signed)
Pt. States Friday she has had nasal congestion, SOB, diahera , NO appetite, SOB, body aches, sore throat.

## 2019-03-22 LAB — NOVEL CORONAVIRUS, NAA (HOSP ORDER, SEND-OUT TO REF LAB; TAT 18-24 HRS): SARS-CoV-2, NAA: NOT DETECTED

## 2019-08-23 ENCOUNTER — Encounter (HOSPITAL_COMMUNITY): Payer: Self-pay

## 2019-08-23 ENCOUNTER — Ambulatory Visit (INDEPENDENT_AMBULATORY_CARE_PROVIDER_SITE_OTHER): Payer: Self-pay

## 2019-08-23 ENCOUNTER — Other Ambulatory Visit: Payer: Self-pay

## 2019-08-23 ENCOUNTER — Ambulatory Visit (HOSPITAL_COMMUNITY)
Admission: EM | Admit: 2019-08-23 | Discharge: 2019-08-23 | Disposition: A | Discharge status: Home / Self Care | Payer: Self-pay | Attending: Family Medicine | Admitting: Family Medicine

## 2019-08-23 DIAGNOSIS — M7989 Other specified soft tissue disorders: Secondary | ICD-10-CM

## 2019-08-23 DIAGNOSIS — M659 Synovitis and tenosynovitis, unspecified: Secondary | ICD-10-CM

## 2019-08-23 DIAGNOSIS — M25571 Pain in right ankle and joints of right foot: Secondary | ICD-10-CM

## 2019-08-23 MED ORDER — KETOROLAC TROMETHAMINE 30 MG/ML IJ SOLN
INTRAMUSCULAR | Status: AC
  Filled 2019-08-23: qty 1

## 2019-08-23 MED ORDER — NAPROXEN 500 MG PO TABS: 500.0000 mg | ORAL_TABLET | Freq: Two times a day (BID) | ORAL | 0 refills | Status: DC | PRN

## 2019-08-23 MED ORDER — KETOROLAC TROMETHAMINE 30 MG/ML IJ SOLN
30.0000 mg | Freq: Once | INTRAMUSCULAR | Status: AC
  Administered 2019-08-23: 13:00:00 30 mg via INTRAMUSCULAR

## 2019-08-23 NOTE — Discharge Instructions (Signed)
Wear boot for 1-2 weeks Take naprosyn 2 times a day with food See your primary care if not better in a couple of weeks

## 2019-08-23 NOTE — ED Provider Notes (Signed)
[place]    CSN: [id number] Arrival date & time: 08/23/19  1041      History   Chief Complaint Chief Complaint  Patient presents with  . Ankle Pain    HPI Nicole Cisneros is a 49 y.o. female.   HPI   Patient woke up with ankle pain, right ankle, 5 days ago.  No accident or injury.  No overuse.  She has not done anything today prior.  No change in shoewear.  No change in activity.  She states because of the ankle pain when she stepped out of the shower onto this ankle it collapsed and she fell.  She is having increased pain since that time.  She states she cannot walk comfortably.  It is swollen. Patient states she has "arthritis" in other joints.  No problems with ankle previously.  Past Medical History:  Diagnosis Date  . Anemia   . Constipation   . COPD (chronic obstructive pulmonary disease) ([place])   . Cough   . Headache(784.0)   . Migraine   . Sore throat   . Weakness   . Weight loss     Patient Active Problem List   Diagnosis Date Noted  . Myalgias, multiple 10/23/2014  . Pain in joint, shoulder region 09/14/2014  . Left knee pain 06/21/2014  . GERD (gastroesophageal reflux disease) 03/14/2013  . Anemia 03/14/2013  . Smoking 03/14/2013  . Appendicitis 03/14/2011    Past Surgical History:  Procedure Laterality Date  . APPENDECTOMY  03/14/11  . LAPAROSCOPIC APPENDECTOMY  03/14/2011   Procedure: APPENDECTOMY LAPAROSCOPIC;  Surgeon: Shann Medal, MD;  Location: [facility];  Service: General;  Laterality: N/A;  . TUBAL LIGATION      OB History   No obstetric history on file.      Home Medications    Prior to Admission medications   Medication Sig Start Date End Date Taking? Authorizing Provider  albuterol (VENTOLIN HFA) 108 (90 Base) MCG/ACT inhaler Inhale 2 puffs into the lungs every 6 (six) hours as needed for wheezing or shortness of breath. 03/21/19  Yes Amyot, Nicholes Stairs, NP  naproxen (NAPROSYN) 500 MG tablet Take 1 tablet (500 mg total)  by mouth 2 (two) times daily as needed for moderate pain or headache. 08/23/19   Raylene Everts, MD  fluticasone [facility]) 50 MCG/ACT nasal spray Place 2 sprays into both nostrils daily. FOR NASAL CONGESTION 06/30/17 03/21/19  Kinnie Feil, PA-C  potassium chloride SA (K-DUR,KLOR-CON) 20 MEQ tablet Take 2 tablets (40 mEq total) by mouth 2 (two) times daily for 7 days. 06/30/17 08/23/19  Kinnie Feil, PA-C    Family History Family History  Problem Relation Age of Onset  . Cancer Father        bone  . Cancer Sister        breast  . Heart disease Mother   . Hypertension Mother     Social History Social History   Tobacco Use  . Smoking status: Current Every Day Smoker    Packs/day: 1.00    Years: 12.00    Pack years: 12.00    Types: Cigarettes  . Smokeless tobacco: Never Used  . Tobacco comment: Smoking 1 ppd  Substance Use Topics  . Alcohol use: No    Alcohol/week: 0.0 standard drinks  . Drug use: No     Allergies   Amoxicillin, Erythromycin, Percocet [oxycodone-acetaminophen], and Vicodin [hydrocodone-acetaminophen]   Review of Systems Review of Systems  Musculoskeletal: Positive for  arthralgias and gait problem.     Physical Exam Triage Vital Signs ED Triage Vitals  Enc Vitals Group     BP 08/23/19 1138 124/78     Pulse Rate 08/23/19 1138 72     Resp 08/23/19 1138 18     Temp 08/23/19 1138 98 F (36.7 C)     Temp Source 08/23/19 1138 Oral     SpO2 08/23/19 1138 100 %     Weight --      Height --      Head Circumference --      Peak Flow --      Pain Score 08/23/19 1134 8     Pain Loc --      Pain Edu? --      Excl. in [place]? --    No data found.  Updated Vital Signs BP 124/78 (BP Location: Right Arm)   Pulse 72   Temp 98 F (36.7 C) (Oral)   Resp 18   SpO2 100%     Physical Exam Constitutional:      General: She is not in acute distress.    Appearance: She is well-developed and normal weight.     Comments: Patient is in wheelchair.   Resists putting full weight on right ankle  HENT:     Head: Normocephalic and atraumatic.     Mouth/Throat:     Comments: Mask is in place Eyes:     Conjunctiva/sclera: Conjunctivae normal.     Pupils: Pupils are equal, round, and reactive to light.  Cardiovascular:     Rate and Rhythm: Normal rate.  Pulmonary:     Effort: Pulmonary effort is normal. No respiratory distress.  Musculoskeletal:        General: Normal range of motion.     Cervical back: Normal range of motion.     Comments: Right ankle has limited range of motion.  Patient resists moving ankle because of pain.  There is swelling in the lateral ankle.  Swelling is localized, as is the tenderness, to the posterior to the lateral malleolus around to the lateral foot consistent with tendinitis.  Skin:    General: Skin is warm and dry.  Neurological:     Mental Status: She is alert.  Psychiatric:        Mood and Affect: Mood normal.        Behavior: Behavior normal.      UC Treatments / Results  Labs (all labs ordered are listed, but only abnormal results are displayed) Labs Reviewed - No data to display  EKG   Radiology DG Ankle Complete Right  Result Date: 08/23/2019 CLINICAL DATA:  Lateral RIGHT ankle pain after falling 4 days ago, swelling, unable to bear weight EXAM: RIGHT ANKLE - COMPLETE 3+ VIEW COMPARISON:  None FINDINGS: Lateral soft tissue swelling. Osseous mineralization normal. Joint spaces preserved. No fracture, dislocation, or bone destruction. IMPRESSION: No acute osseous abnormalities. Electronically Signed   By: Lavonia Dana M.D.   On: 08/23/2019 13:10    Procedures Procedures (including critical care time)  Medications Ordered in UC Medications  ketorolac (TORADOL) 30 MG/ML injection 30 mg (30 mg Intramuscular Given 08/23/19 1232)    Initial Impression / Assessment and Plan / UC Course  I have reviewed the triage vital signs and the nursing notes.  Pertinent labs & imaging results that  were available during my care of the patient were reviewed by me and considered in my medical decision making (see chart for details).  X-ray was done because of patient's report that she was unable to fully bear weight without pain, because of her fall.  It was negative. Final Clinical Impressions(s) / UC Diagnoses   Final diagnoses:  Acute right ankle pain  Tenosynovitis of right ankle     Discharge Instructions     Wear boot for 1-2 weeks Take naprosyn 2 times a day with food See your primary care if not better in a couple of weeks   ED Prescriptions    Medication Sig Dispense Auth. Provider   naproxen (NAPROSYN) 500 MG tablet Take 1 tablet (500 mg total) by mouth 2 (two) times daily as needed for moderate pain or headache. 20 tablet Raylene Everts, MD     PDMP not reviewed this encounter.   Raylene Everts, MD 08/23/19 1414

## 2019-08-23 NOTE — ED Triage Notes (Addendum)
Pt c/o of acute onset  right ankle pain and swelling upon awakeing Friday morning. Denies any known injury or trauma to area. 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 Pt reports falling Saturday exiting the shower secondary to ankle pain/swelling.  Edema to lateral right ankle noted; tender on palp to lateral malleolus and distal to ankle at top of foot.  Pt reports h/o gout.

## 2020-04-14 HISTORY — PX: BREAST BIOPSY: SHX20

## 2020-04-21 ENCOUNTER — Other Ambulatory Visit: Payer: Self-pay

## 2020-04-21 ENCOUNTER — Encounter (HOSPITAL_COMMUNITY): Payer: Self-pay

## 2020-04-21 ENCOUNTER — Emergency Department (HOSPITAL_COMMUNITY)
Admission: EM | Admit: 2020-04-21 | Discharge: 2020-04-22 | Disposition: A | Discharge status: Home / Self Care | Payer: No Typology Code available for payment source | Attending: Emergency Medicine | Admitting: Emergency Medicine

## 2020-04-21 DIAGNOSIS — K219 Gastro-esophageal reflux disease without esophagitis: Secondary | ICD-10-CM | POA: Insufficient documentation

## 2020-04-21 DIAGNOSIS — U071 COVID-19: Secondary | ICD-10-CM | POA: Insufficient documentation

## 2020-04-21 DIAGNOSIS — F17210 Nicotine dependence, cigarettes, uncomplicated: Secondary | ICD-10-CM | POA: Insufficient documentation

## 2020-04-21 DIAGNOSIS — J449 Chronic obstructive pulmonary disease, unspecified: Secondary | ICD-10-CM | POA: Insufficient documentation

## 2020-04-21 LAB — COMPREHENSIVE METABOLIC PANEL
ALT: 18 U/L (ref 0–44)
AST: 21 U/L (ref 15–41)
Albumin: 4.1 g/dL (ref 3.5–5.0)
Alkaline Phosphatase: 55 U/L (ref 38–126)
Anion gap: 10 (ref 5–15)
BUN: 11 mg/dL (ref 6–20)
CO2: 27 mmol/L (ref 22–32)
Calcium: 9.6 mg/dL (ref 8.9–10.3)
Chloride: 103 mmol/L (ref 98–111)
Creatinine, Ser: 0.87 mg/dL (ref 0.44–1.00)
GFR, Estimated: >60 mL/min (ref >60)
Glucose, Bld: 106 mg/dL — ABNORMAL HIGH (ref 70–99)
Potassium: 3.9 mmol/L (ref 3.5–5.1)
Sodium: 140 mmol/L (ref 135–145)
Total Bilirubin: 0.8 mg/dL (ref 0.3–1.2)
Total Protein: 8 g/dL (ref 6.5–8.1)

## 2020-04-21 LAB — URINALYSIS, ROUTINE W REFLEX MICROSCOPIC
Bilirubin Urine: NEGATIVE
Glucose, UA: NEGATIVE mg/dL
Hgb urine dipstick: NEGATIVE
Ketones, ur: NEGATIVE mg/dL
Nitrite: NEGATIVE
Protein, ur: 30 mg/dL — AB
Specific Gravity, Urine: 1.027 (ref 1.005–1.030)
pH: 5 (ref 5.0–8.0)

## 2020-04-21 LAB — CBC
HCT: 40.7 % (ref 36.0–46.0)
Hemoglobin: 13.3 g/dL (ref 12.0–15.0)
MCH: 28.2 pg (ref 26.0–34.0)
MCHC: 32.7 g/dL (ref 30.0–36.0)
MCV: 86.2 fL (ref 80.0–100.0)
Platelets: 274 10*3/uL (ref 150–400)
RBC: 4.72 MIL/uL (ref 3.87–5.11)
RDW: 14.6 % (ref 11.5–15.5)
WBC: 6.6 10*3/uL (ref 4.0–10.5)
nRBC: 0 % (ref 0.0–0.2)

## 2020-04-21 LAB — LIPASE, BLOOD: Lipase: 27 U/L (ref 11–51)

## 2020-04-21 NOTE — ED Triage Notes (Signed)
Pt presents POV with acid reflux, pt reports a hx of the same, unable to sleep the last two nights. She spoke with the on call nurse last night. Pt states the only thing that helps her is the GI cocktail we give her. Pt denies taking OTC  Meds, states, they don't do anything for me

## 2020-04-22 LAB — TROPONIN I (HIGH SENSITIVITY): Troponin I (High Sensitivity): 3 ng/L (ref <18)

## 2020-04-22 MED ORDER — ALUM & MAG HYDROXIDE-SIMETH 200-200-20 MG/5ML PO SUSP
30.0000 mL | Freq: Once | ORAL | Status: AC
  Administered 2020-04-22: 30 mL via ORAL
  Filled 2020-04-22: qty 30

## 2020-04-22 MED ORDER — OMEPRAZOLE 20 MG PO CPDR: 20.0000 mg | DELAYED_RELEASE_CAPSULE | Freq: Every day | ORAL | 0 refills | Status: DC

## 2020-04-22 MED ORDER — ALUM & MAG HYDROXIDE-SIMETH 200-200-20 MG/5ML PO SUSP: 30.0000 mL | Freq: Four times a day (QID) | ORAL | 0 refills | Status: DC | PRN

## 2020-04-22 NOTE — ED Provider Notes (Signed)
[facility]ERG[facility]Y DEPARTMENT Provider Note   CSN: [id number] Arrival date & time: 04/21/20  1657     History Chief Complaint  Patient presents with  . Abdominal Pain    Nicole Cisneros is a 50 y.o. female.  The history is provided by the patient.  Abdominal Pain Pain location:  Generalized Pain quality: not burning   Pain radiates to:  Chest Pain severity:  Moderate Onset quality:  Gradual Duration:  5 days Timing:  Intermittent Progression:  Worsening Chronicity:  New Relieved by:  Nothing Exacerbated by: lying flat. Associated symptoms: cough, diarrhea, nausea and vomiting   Associated symptoms: no dysuria and no fever    Patient with history of COPD, reports recent diagnosis of COVID-19 presents with multiple complaints. Patient reports she was diagnosed with COVID-19 over 10 days ago. Approximately 5 days ago she began having acid reflux. She reports it starts in her low abdomen and then will radiate into her chest. It is worsened by lying flat. She has had this before. She is also been having associated nonbloody emesis and diarrhea. She does not take any daily medications for acid reflux      Past Medical History:  Diagnosis Date  . Anemia   . Constipation   . COPD (chronic obstructive pulmonary disease) ([place])   . Cough   . Headache(784.0)   . Migraine   . Sore throat   . Weakness   . Weight loss     Patient Active Problem List   Diagnosis Date Noted  . Myalgias, multiple 10/23/2014  . Pain in joint, shoulder region 09/14/2014  . Left knee pain 06/21/2014  . GERD (gastroesophageal reflux disease) 03/14/2013  . Anemia 03/14/2013  . Smoking 03/14/2013  . Appendicitis 03/14/2011    Past Surgical History:  Procedure Laterality Date  . APPENDECTOMY  03/14/11  . LAPAROSCOPIC APPENDECTOMY  03/14/2011   Procedure: APPENDECTOMY LAPAROSCOPIC;  Surgeon: Shann Medal, MD;  Location: [facility];  Service: General;  Laterality: N/A;  . TUBAL  LIGATION       OB History   No obstetric history on file.     Family History  Problem Relation Age of Onset  . Cancer Father        bone  . Cancer Sister        breast  . Heart disease Mother   . Hypertension Mother     Social History   Tobacco Use  . Smoking status: Current Every Day Smoker    Packs/day: 1.00    Years: 12.00    Pack years: 12.00    Types: Cigarettes  . Smokeless tobacco: Never Used  . Tobacco comment: Smoking 1 ppd  Substance Use Topics  . Alcohol use: No    Alcohol/week: 0.0 standard drinks  . Drug use: No    Home Medications Prior to Admission medications   Medication Sig Start Date End Date Taking? Authorizing Provider  albuterol (VENTOLIN HFA) 108 (90 Base) MCG/ACT inhaler Inhale 2 puffs into the lungs every 6 (six) hours as needed for wheezing or shortness of breath. 03/21/19   Katy Apo, NP  naproxen (NAPROSYN) 500 MG tablet Take 1 tablet (500 mg total) by mouth 2 (two) times daily as needed for moderate pain or headache. 08/23/19   Raylene Everts, MD  fluticasone [facility]) 50 MCG/ACT nasal spray Place 2 sprays into both nostrils daily. FOR NASAL CONGESTION 06/30/17 03/21/19  Kinnie Feil, PA-C  potassium chloride SA (K-DUR,KLOR-CON) 20  MEQ tablet Take 2 tablets (40 mEq total) by mouth 2 (two) times daily for 7 days. 06/30/17 08/23/19  Kinnie Feil, PA-C    Allergies    Amoxicillin, Erythromycin, Percocet [oxycodone-acetaminophen], and Vicodin [hydrocodone-acetaminophen]  Review of Systems   Review of Systems  Constitutional: Negative for fever.  Respiratory: Positive for cough.   Gastrointestinal: Positive for abdominal pain, diarrhea, nausea and vomiting. Negative for blood in stool.  Genitourinary: Negative for dysuria.  All other systems reviewed and are negative.   Physical Exam Updated Vital Signs BP 106/60   Pulse 61   Temp 97.9 F (36.6 C) (Oral)   Resp 16   SpO2 99%   Physical Exam CONSTITUTIONAL: Well  developed/well nourished, lying on her left side in no distress HEAD: Normocephalic/atraumatic EYES: EOMI/PERRL ENMT: Mucous membranes moist NECK: supple no meningeal signs SPINE/BACK:entire spine nontender CV: S1/S2 noted, no murmurs/rubs/gallops noted LUNGS: Lungs are clear to auscultation bilaterally, no apparent distress ABDOMEN: soft, nontender, no rebound or guarding, bowel sounds noted throughout abdomen GU:no cva tenderness NEURO: Pt is awake/alert/appropriate, moves all extremitiesx4.  No facial droop.   EXTREMITIES: pulses normal/equalx4, full ROM, no calf tenderness SKIN: warm, color normal PSYCH: no abnormalities of mood noted, alert and oriented to situation  ED Results / Procedures / Treatments   Labs (all labs ordered are listed, but only abnormal results are displayed) Labs Reviewed  COMPREHENSIVE METABOLIC PANEL - Abnormal; Notable for the following components:      Result Value   Glucose, Bld 106 (*)    All other components within normal limits  URINALYSIS, ROUTINE W REFLEX MICROSCOPIC - Abnormal; Notable for the following components:   APPearance HAZY (*)    Protein, ur 30 (*)    Leukocytes,Ua MODERATE (*)    Bacteria, UA RARE (*)    All other components within normal limits  LIPASE, BLOOD  CBC  TROPONIN I (HIGH SENSITIVITY)    EKG EKG Interpretation  Date/Time:  Saturday April 21 2020 17:05:31 EST Ventricular Rate:  74 PR Interval:  150 QRS Duration: 86 QT Interval:  384 QTC Calculation: 426 R Axis:   61 Text Interpretation: Normal sinus rhythm with sinus arrhythmia Right atrial enlargement Nonspecific ST and T wave abnormality Abnormal ECG When compared with ECG of 03/21/2019, Nonspecific ST and T wave abnormality is now present Confirmed by Delora Fuel ([id number]) on 04/22/2020 1:01:51 AM   Radiology No results found.  Procedures Procedures   Medications Ordered in ED Medications  alum & mag hydroxide-simeth (MAALOX/MYLANTA) 200-200-20 MG/5ML  suspension 30 mL (30 mLs Oral Given 04/22/20 3474)    ED Course  I have reviewed the triage vital signs and the nursing notes.  Pertinent labs results that were available during my care of the patient were reviewed by me and considered in my medical decision making (see chart for details).    MDM Rules/Calculators/A&P                          6:16 AM Patient presents for multiple complaints, including what she describes as acid reflux with associated vomiting and diarrhea. Patient is in no acute distress at this time. Labs thus far are overall unremarkable She is requesting medications for her reflux. She has no focal abdominal tenderness, will defer imaging at this time. Low suspicion for ACS and she describes the symptoms beginning in her abdomen EKG is abnormal, but some of these findings have been seen in previous EKGs, some of  these findings can be associated with COPD Low suspicion for ACS/PE/dissection at this time 7:35 AM Patient feels improved She reports the meds helped her reflux.  She reports this is very similar to prior episodes and the reason she came to the ED, is because it disturbs her sleep. She is in no acute distress. Will prescribe Prilosec and start on Mylanta. Will refer to gastroenterology.  Advised avoid NSAIDs given dietary recommendations. Low suspicion for acute cardio pulmonary or abdominal emergency Final Clinical Impression(s) / ED Diagnoses Final diagnoses:  Gastroesophageal reflux disease, unspecified whether esophagitis present    Rx / DC Orders ED Discharge Orders         Ordered    omeprazole (PRILOSEC) 20 MG capsule  Daily        04/22/20 0726    alum & mag hydroxide-simeth (MYLANTA) 200-200-20 MG/5ML suspension  Every 6 hours PRN        04/22/20 0726           Ripley Fraise, MD 04/22/20 [id number]

## 2020-04-25 ENCOUNTER — Encounter: Payer: Self-pay | Admitting: Family

## 2020-04-25 ENCOUNTER — Other Ambulatory Visit: Payer: Self-pay

## 2020-04-25 ENCOUNTER — Ambulatory Visit (INDEPENDENT_AMBULATORY_CARE_PROVIDER_SITE_OTHER): Payer: No Typology Code available for payment source | Admitting: Family

## 2020-04-25 VITALS — BP 123/84 | HR 73 | Ht 64.37 in | Wt 154.4 lb

## 2020-04-25 DIAGNOSIS — K219 Gastro-esophageal reflux disease without esophagitis: Secondary | ICD-10-CM

## 2020-04-25 DIAGNOSIS — Z23 Encounter for immunization: Secondary | ICD-10-CM

## 2020-04-25 DIAGNOSIS — M1990 Unspecified osteoarthritis, unspecified site: Secondary | ICD-10-CM

## 2020-04-25 DIAGNOSIS — Z7689 Persons encountering health services in other specified circumstances: Secondary | ICD-10-CM

## 2020-04-25 NOTE — Patient Instructions (Signed)
Return in 4 to 6 weeks or sooner if needed for annual physical examination, labs, and health maintenance. Arrive fasting meaning having had no food and/or nothing to drink for at least 8 hours prior to appointment.  Referral to Orthopedics.   Keep appointment with Gastroenterology.  Flu vaccine today. Thank you for choosing Primary Care at [facility] for your medical home!    Nicole Cisneros was seen by Camillia Herter, NP today.   Nicole Cisneros's primary care provider is Camillia Herter, NP.   For the best care possible,  you should try to see Durene Fruits, NP whenever you come to clinic.   We look forward to seeing you again soon!  If you have any questions about your visit today,  please call us at [phone]  Or feel free to reach your provider via [place].    Gastroesophageal Reflux Disease, Adult  Gastroesophageal reflux (GER) happens when acid from the stomach flows up into the tube that connects the mouth and the stomach (esophagus). Normally, food travels down the esophagus and stays in the stomach to be digested. With GER, food and stomach acid sometimes move back up into the esophagus. You may have a disease called gastroesophageal reflux disease (GERD) if the reflux:  Happens often.  Causes frequent or very bad symptoms.  Causes problems such as damage to the esophagus. When this happens, the esophagus becomes sore and swollen. Over time, GERD can make small holes (ulcers) in the lining of the esophagus. What are the causes? This condition is caused by a problem with the muscle between the esophagus and the stomach. When this muscle is weak or not normal, it does not close properly to keep food and acid from coming back up from the stomach. The muscle can be weak because of:  Tobacco use.  Pregnancy.  Having a certain type of hernia (hiatal hernia).  Alcohol use.  Certain foods and drinks, such as coffee, chocolate, onions, and peppermint. What  increases the risk?  Being overweight.  Having a disease that affects your connective tissue.  Taking NSAIDs, such a ibuprofen. What are the signs or symptoms?  Heartburn.  Difficult or painful swallowing.  The feeling of having a lump in the throat.  A bitter taste in the mouth.  Bad breath.  Having a lot of saliva.  Having an upset or bloated stomach.  Burping.  Chest pain. Different conditions can cause chest pain. Make sure you see your doctor if you have chest pain.  Shortness of breath or wheezing.  A long-term cough or a cough at night.  Wearing away of the surface of teeth (tooth enamel).  Weight loss. How is this treated?  Making changes to your diet.  Taking medicine.  Having surgery. Treatment will depend on how bad your symptoms are. Follow these instructions at home: Eating and drinking  Follow a diet as told by your doctor. You may need to avoid foods and drinks such as: ? Coffee and tea, with or without caffeine. ? Drinks that contain alcohol. ? Energy drinks and sports drinks. ? Bubbly (carbonated) drinks or sodas. ? Chocolate and cocoa. ? Peppermint and mint flavorings. ? Garlic and onions. ? Horseradish. ? Spicy and acidic foods. These include peppers, chili powder, curry powder, vinegar, hot sauces, and BBQ sauce. ? Citrus fruit juices and citrus fruits, such as oranges, lemons, and limes. ? Tomato-based foods. These include red sauce, chili, salsa, and pizza with red sauce. ? [place]  and fatty foods. These include donuts, french fries, potato chips, and high-fat dressings. ? High-fat meats. These include hot dogs, rib eye steak, sausage, ham, and bacon. ? High-fat dairy items, such as whole milk, butter, and cream cheese.  Eat small meals often. Avoid eating large meals.  Avoid drinking large amounts of liquid with your meals.  Avoid eating meals during the 2-3 hours before bedtime.  Avoid lying down right after you eat.  Do not  exercise right after you eat.   Lifestyle  Do not smoke or use any products that contain nicotine or tobacco. If you need help quitting, ask your doctor.  Try to lower your stress. If you need help doing this, ask your doctor.  If you are overweight, lose an amount of weight that is healthy for you. Ask your doctor about a safe weight loss goal.   General instructions  Pay attention to any changes in your symptoms.  Take over-the-counter and prescription medicines only as told by your doctor.  Do not take aspirin, ibuprofen, or other NSAIDs unless your doctor says it is okay.  Wear loose clothes. Do not wear anything tight around your waist.  Raise (elevate) the head of your bed about 6 inches (15 cm). You may need to use a wedge to do this.  Avoid bending over if this makes your symptoms worse.  Keep all follow-up visits. Contact a doctor if:  You have new symptoms.  You lose weight and you do not know why.  You have trouble swallowing or it hurts to swallow.  You have wheezing or a cough that keeps happening.  You have a hoarse voice.  Your symptoms do not get better with treatment. Get help right away if:  You have sudden pain in your arms, neck, jaw, teeth, or back.  You suddenly feel sweaty, dizzy, or light-headed.  You have chest pain or shortness of breath.  You vomit and the vomit is green, yellow, or black, or it looks like blood or coffee grounds.  You faint.  Your poop (stool) is red, bloody, or black.  You cannot swallow, drink, or eat. These symptoms may represent a serious problem that is an emergency. Do not wait to see if the symptoms will go away. Get medical help right away. Call your local emergency services (911 in the [place] Do not drive yourself to the hospital. Summary  If a person has gastroesophageal reflux disease (GERD), food and stomach acid move back up into the esophagus and cause symptoms or problems such as damage to the  esophagus.  Treatment will depend on how bad your symptoms are.  Follow a diet as told by your doctor.  Take all medicines only as told by your doctor. This information is not intended to replace advice given to you by your health care provider. Make sure you discuss any questions you have with your health care provider. Document Revised: 10/10/2019 Document Reviewed: 10/10/2019 Elsevier Patient Education  [place].
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
## 2020-04-25 NOTE — Progress Notes (Signed)
Establish care Body fatigue and aching  Joint pain  Headache for over week Acid reflux Wants flu shot

## 2020-05-02 ENCOUNTER — Ambulatory Visit: Payer: Self-pay | Attending: Family Medicine

## 2020-05-02 ENCOUNTER — Other Ambulatory Visit: Payer: Self-pay

## 2020-05-04 ENCOUNTER — Other Ambulatory Visit: Payer: Self-pay | Admitting: Gastroenterology

## 2020-05-04 ENCOUNTER — Other Ambulatory Visit: Payer: Self-pay

## 2020-05-04 ENCOUNTER — Ambulatory Visit (INDEPENDENT_AMBULATORY_CARE_PROVIDER_SITE_OTHER): Payer: Self-pay | Admitting: Gastroenterology

## 2020-05-04 ENCOUNTER — Encounter: Payer: Self-pay | Admitting: Gastroenterology

## 2020-05-04 VITALS — BP 130/70 | HR 66 | Ht 64.0 in | Wt 155.0 lb

## 2020-05-04 DIAGNOSIS — Z1211 Encounter for screening for malignant neoplasm of colon: Secondary | ICD-10-CM

## 2020-05-04 DIAGNOSIS — K219 Gastro-esophageal reflux disease without esophagitis: Secondary | ICD-10-CM

## 2020-05-04 MED ORDER — PANTOPRAZOLE SODIUM 40 MG PO TBEC: 40.0000 mg | DELAYED_RELEASE_TABLET | Freq: Two times a day (BID) | ORAL | 2 refills | Status: DC

## 2020-05-04 MED FILL — PANTOPRAZOLE SOD DR 40 MG T: 40 | 30 days supply | Qty: 60 | Fill #0

## 2020-05-04 NOTE — Progress Notes (Signed)
Referring Provider: No ref. provider found Primary Care Physician:  Camillia Herter, NP  Reason for Consultation:  GERD   IMPRESSION:  Reflux not responding to PPI therapy No prior colon cancer screening No known family history of colon cancer or polyps  I have recommended an EGD with esophageal and gastric biopsies given the differential of reflux esophagitis, persistent H pylori, PUD, gastritis, and non-erosive reflux disease. Plan PPI BID. Consider TIF in the future if there is a desire to avoid medications.   PLAN: - Reviewed GERD lifestyle modifications (brochure provided) - Wedge pillow recommended - Start pantoprazole 40 mg BID - EGD with esophageal and gastric biopsies - Colonoscopy for colon cancer screening - Referral to nutritionist to discuss a reflux friendly diet as she wasn't sure what she would eat after reviewing a reflux diet - Low threshold for abdominal imaging  Please see the "Patient Instructions" section for addition details about the plan.  HPI: Nicole Cisneros is a 50 y.o. female who is referred by the ED. She works in Actuary at [employer].  Years of severe acid reflux described a severe brash with associated cough, regurgitation, and vomiting.  Worse at night.  No choking, dysphagia, odynophagia, dysphonia, neck pain, or sore throat. Worse at night when she lies flat. Triggered by spicy or greasy foods.  Taking omeprazole OTC 20 mg and trial of Pepcid for years with recurrent breakthrough symptoms. Appetite is good. Weight is stable.   Has ended up in the ED on multiple occassions, most recently 04/21/20, given the severity of symptoms.     No prior abdominal imaging except for abdominal films 06/06/18. No prior endoscopy.   She smokes cigarettes.  Denies alcohol or street drugs.  No known family history of colon cancer or polyps. No family history of uterine/endometrial cancer, pancreatic cancer or gastric/stomach cancer.   Past Medical  History:  Diagnosis Date  . Anemia   . Constipation   . COPD (chronic obstructive pulmonary disease) ([place])   . Cough   . Headache(784.0)   . Migraine   . Sore throat   . Weakness   . Weight loss     Past Surgical History:  Procedure Laterality Date  . LAPAROSCOPIC APPENDECTOMY  03/14/2011   Procedure: APPENDECTOMY LAPAROSCOPIC;  Surgeon: Shann Medal, MD;  Location: [place];  Service: General;  Laterality: N/A;  . TUBAL LIGATION      Current Outpatient Medications  Medication Sig Dispense Refill  . alum & mag hydroxide-simeth (MYLANTA) 347-425-95 MG/5ML suspension Take 30 mLs by mouth every 6 (six) hours as needed for indigestion or heartburn. 355 mL 0  . calcium carbonate (TUMS - DOSED IN MG ELEMENTAL CALCIUM) 500 MG chewable tablet Chew 2-3 tablets by mouth daily as needed for indigestion or heartburn.    Marland Kitchen omeprazole (PRILOSEC) 20 MG capsule Take 1 capsule (20 mg total) by mouth daily. 30 capsule 0   No current facility-administered medications for this visit.    Allergies as of 05/04/2020 - Review Complete 05/04/2020  Allergen Reaction Noted  . Amoxicillin Nausea And Vomiting 03/13/2011  . Erythromycin Nausea And Vomiting 03/13/2011  . Percocet [oxycodone-acetaminophen] Nausea And Vomiting 03/13/2011  . Vicodin [hydrocodone-acetaminophen] Nausea And Vomiting 03/13/2011    Family History  Problem Relation Age of Onset  . Cancer Father        bone  . Cancer Sister        breast  . Heart disease Mother   . Hypertension Mother  Social History   Socioeconomic History  . Marital status: Single    Spouse name: Not on file  . Number of children: 2  . Years of education: Not on file  . Highest education level: Not on file  Occupational History  . Occupation: housekeeping  Tobacco Use  . Smoking status: Current Every Day Smoker    Packs/day: 1.00    Years: 12.00    Pack years: 12.00    Types: Cigarettes  . Smokeless tobacco: Never Used  . Tobacco comment:  Smoking 1 ppd  Vaping Use  . Vaping Use: Never used  Substance and Sexual Activity  . Alcohol use: No    Alcohol/week: 0.0 standard drinks  . Drug use: No  . Sexual activity: Not on file  Other Topics Concern  . Not on file  Social History Narrative  . Not on file   Social Determinants of Health   Financial Resource Strain: Not on file  Food Insecurity: Not on file  Transportation Needs: Not on file  Physical Activity: Not on file  Stress: Not on file  Social Connections: Not on file  Intimate Partner Violence: Not on file    Review of Systems: 12 system ROS is negative except as noted above.   Physical Exam: General:   Alert,  well-nourished, pleasant and cooperative in NAD Head:  Normocephalic and atraumatic. Eyes:  Sclera clear, no icterus.   Conjunctiva pink. Ears:  Normal auditory acuity. Nose:  No deformity, discharge,  or lesions. Mouth:  No deformity or lesions.   Neck:  Supple; no masses or thyromegaly. Lungs:  Clear throughout to auscultation.   No wheezes. Heart:  Regular rate and rhythm; no murmurs. Abdomen:  Soft,nontender, nondistended, normal bowel sounds, no rebound or guarding. No hepatosplenomegaly.   Rectal:  Deferred  Msk:  Symmetrical. No boney deformities LAD: No inguinal or umbilical LAD Extremities:  No clubbing or edema. Neurologic:  Alert and  oriented x4;  grossly nonfocal Skin:  Intact without significant lesions or rashes. Psych:  Alert and cooperative. Normal mood and affect.     Sunshine Mackowski L. Tarri Glenn, MD, MPH 05/04/2020, 2:43 PM

## 2020-05-04 NOTE — Patient Instructions (Addendum)
RECOMMENDATIONS:  To minimize your reflux, please avoid caffeine, alcohol, chocolate, peppermints, carbonated beverages, and nicotine, and consuming acid and spicy foods only in moderation. Eat smaller meals and avoid eating for several hours before exercise or sleep. Work to maintain a health weight.   Please quit smoking. Smoking can worse your heartburn and increase your risk for throat cancer.   You may find using a wedge pillow will provide significant relief in your heartburn.   I have recommended an upper endoscopy to evaluate your reflux. It is time for a colonoscopy for colon cancer screening.   COLONOSCOPY AND ENDOSCOPY: You have been scheduled for an endoscopy and colonoscopy. Please follow the written instructions given to you at your visit today.  PREP: Please pick up your prep supplies at the pharmacy within the next 1-3 days.  INHALERS: If you use inhalers (even only as needed), please bring them with you on the day of your procedure.  PRESCRIPTION MEDICATION(S): We have sent the following medication(s) to your pharmacy:   Pantoprazole - please take 40mg  by mouth twice daily   NOTE: If your medication(s) requires a PRIOR AUTHORIZATION, we will receive notification from your pharmacy. Once received, the process to submit for approval may take up to 7-10 business days. You will be contacted about any denials we have received from your insurance company as well as alternatives recommended by your provider.  REFERRAL:  At your request, we will send a referral to a [facility] Nutritionist to discuss a reflux friendly diet. You will receive a call from their office regarding the date, time and location of your appointment.  If you are age 52 or younger, your body mass index should be between 19-25. Your There is no height or weight on file to calculate BMI. If this is out of the aformentioned range listed, please consider follow up with your Primary Care Provider.   Thank you for  trusting me with your gastrointestinal care!    Thornton Park, MD, MPH

## 2020-05-17 ENCOUNTER — Telehealth: Payer: Self-pay | Admitting: Family

## 2020-05-17 NOTE — Telephone Encounter (Signed)
Pt calling to inform PCP of Omeprazole side effects: constant headache, vomiting and "tummy is burning." Pt inquiring of alternate treatment if possible or return call from clinic. Please advise and thank you

## 2020-05-23 ENCOUNTER — Encounter: Payer: Self-pay | Admitting: Orthopaedic Surgery

## 2020-05-23 ENCOUNTER — Ambulatory Visit (INDEPENDENT_AMBULATORY_CARE_PROVIDER_SITE_OTHER): Payer: Self-pay

## 2020-05-23 ENCOUNTER — Ambulatory Visit (INDEPENDENT_AMBULATORY_CARE_PROVIDER_SITE_OTHER): Payer: No Typology Code available for payment source | Admitting: Orthopaedic Surgery

## 2020-05-23 DIAGNOSIS — M545 Low back pain, unspecified: Secondary | ICD-10-CM

## 2020-05-23 DIAGNOSIS — G8929 Other chronic pain: Secondary | ICD-10-CM

## 2020-05-23 DIAGNOSIS — M94261 Chondromalacia, right knee: Secondary | ICD-10-CM

## 2020-05-23 DIAGNOSIS — M2242 Chondromalacia patellae, left knee: Secondary | ICD-10-CM

## 2020-05-23 DIAGNOSIS — M7062 Trochanteric bursitis, left hip: Secondary | ICD-10-CM

## 2020-05-23 NOTE — Addendum Note (Signed)
Addended by: Robyne Peers on: 05/23/2020 02:18 PM   Modules accepted: Orders
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
## 2020-06-06 ENCOUNTER — Ambulatory Visit (INDEPENDENT_AMBULATORY_CARE_PROVIDER_SITE_OTHER): Payer: Self-pay | Admitting: Family

## 2020-06-06 ENCOUNTER — Encounter: Payer: Self-pay | Admitting: Family

## 2020-06-06 ENCOUNTER — Other Ambulatory Visit: Payer: Self-pay

## 2020-06-06 VITALS — BP 125/82 | HR 68 | Ht 64.37 in | Wt 155.8 lb

## 2020-06-06 DIAGNOSIS — M25562 Pain in left knee: Secondary | ICD-10-CM

## 2020-06-06 DIAGNOSIS — G8929 Other chronic pain: Secondary | ICD-10-CM

## 2020-06-06 DIAGNOSIS — Z131 Encounter for screening for diabetes mellitus: Secondary | ICD-10-CM

## 2020-06-06 DIAGNOSIS — Z0000 Encounter for general adult medical examination without abnormal findings: Secondary | ICD-10-CM

## 2020-06-06 DIAGNOSIS — E785 Hyperlipidemia, unspecified: Secondary | ICD-10-CM

## 2020-06-06 DIAGNOSIS — Z1329 Encounter for screening for other suspected endocrine disorder: Secondary | ICD-10-CM

## 2020-06-06 DIAGNOSIS — Z124 Encounter for screening for malignant neoplasm of cervix: Secondary | ICD-10-CM

## 2020-06-06 DIAGNOSIS — N951 Menopausal and female climacteric states: Secondary | ICD-10-CM

## 2020-06-06 DIAGNOSIS — Z1211 Encounter for screening for malignant neoplasm of colon: Secondary | ICD-10-CM

## 2020-06-06 DIAGNOSIS — Z13228 Encounter for screening for other metabolic disorders: Secondary | ICD-10-CM

## 2020-06-06 DIAGNOSIS — Z13220 Encounter for screening for lipoid disorders: Secondary | ICD-10-CM

## 2020-06-06 DIAGNOSIS — Z114 Encounter for screening for human immunodeficiency virus [HIV]: Secondary | ICD-10-CM

## 2020-06-06 DIAGNOSIS — Z1231 Encounter for screening mammogram for malignant neoplasm of breast: Secondary | ICD-10-CM

## 2020-06-06 DIAGNOSIS — Z1159 Encounter for screening for other viral diseases: Secondary | ICD-10-CM

## 2020-06-06 DIAGNOSIS — Z130 Encounter for screening for diseases of the blood and blood-forming organs and certain disorders involving the immune mechanism: Secondary | ICD-10-CM

## 2020-06-06 NOTE — Progress Notes (Unsigned)
Annual physical  Pantoprazole gives pt headache Needs muscle relaxer pain in left leg

## 2020-06-06 NOTE — Progress Notes (Signed)
Patient ID: Nicole Cisneros, female    DOB: 01/24/1971  MRN: [id number]  CC: Annual Physical Exam  Subjective: Nicole Cisneros is a 50 y.o. female who presents for annual physical exam Her concerns today include:   1. LEFT KNEE PAIN: Left knee causing pain. Followed by Orthopedics and reports she had an injection of the hip 2 weeks ago. No follow-up appointment with Orthopedics as of yet. Has a Physical Therapy appointment scheduled in March 2022. Says she is falling often and prefers not to be seen at the time of those occurrences. Sitting down for long periods of time makes pain worse. Does hear popping sound. Left leg weaker than right leg. Putting more pressure to walk on right leg because of this reason. Pain 6-7/10 on most days. Tried Cyclobenzaprine in the past and it helped, requesting refills.   2. HOT FLASHES: LMP many years ago but cannot recall exactly when. Requesting medication.  3. DENTIST REFERRAL: Tooth pain. Left upper tooth located near the back broke off. Left lower tooth broke off. Requests referral.   Patient Active Problem List   Diagnosis Date Noted  . Hyperlipidemia 06/07/2020  . Myalgias, multiple 10/23/2014  . Pain in joint, shoulder region 09/14/2014  . Left knee pain 06/21/2014  . GERD (gastroesophageal reflux disease) 03/14/2013  . Anemia 03/14/2013  . Smoking 03/14/2013  . Appendicitis 03/14/2011     Current Outpatient Medications on File Prior to Visit  Medication Sig Dispense Refill  . alum & mag hydroxide-simeth (MYLANTA) [phone] MG/5ML suspension Take 30 mLs by mouth every 6 (six) hours as needed for indigestion or heartburn. 355 mL 0  . calcium carbonate (TUMS - DOSED IN MG ELEMENTAL CALCIUM) 500 MG chewable tablet Chew 2-3 tablets by mouth daily as needed for indigestion or heartburn.    . pantoprazole (PROTONIX) 40 MG tablet Take 1 tablet (40 mg total) by mouth 2 (two) times daily. (Patient not taking: Reported on 06/06/2020) 60 tablet 2  .  [DISCONTINUED] fluticasone (FLONASE) 50 MCG/ACT nasal spray Place 2 sprays into both nostrils daily. FOR NASAL CONGESTION 16 g 0  . [DISCONTINUED] potassium chloride SA (K-DUR,KLOR-CON) 20 MEQ tablet Take 2 tablets (40 mEq total) by mouth 2 (two) times daily for 7 days. 28 tablet 0   No current facility-administered medications on file prior to visit.    Allergies  Allergen Reactions  . Amoxicillin Nausea And Vomiting  . Erythromycin Nausea And Vomiting  . Percocet [Oxycodone-Acetaminophen] Nausea And Vomiting  . Vicodin [Hydrocodone-Acetaminophen] Nausea And Vomiting    Social History   Socioeconomic History  . Marital status: Single    Spouse name: Not on file  . Number of children: 2  . Years of education: Not on file  . Highest education level: Not on file  Occupational History  . Occupation: housekeeping  Tobacco Use  . Smoking status: Current Every Day Smoker    Packs/day: 1.00    Years: 12.00    Pack years: 12.00    Types: Cigarettes  . Smokeless tobacco: Never Used  . Tobacco comment: Smoking 1 ppd  Vaping Use  . Vaping Use: Never used  Substance and Sexual Activity  . Alcohol use: No    Alcohol/week: 0.0 standard drinks  . Drug use: No  . Sexual activity: Not on file  Other Topics Concern  . Not on file  Social History Narrative  . Not on file   Social Determinants of Health   Financial Resource Strain: Not on file  Food Insecurity: No Food Insecurity  . Worried About Charity fundraiser in the Last Year: Never true  . Ran Out of Food in the Last Year: Never true  Transportation Needs: Not on file  Physical Activity: Not on file  Stress: Not on file  Social Connections: Not on file  Intimate Partner Violence: Not on file    Family History  Problem Relation Age of Onset  . Cancer Father        bone  . Cancer Sister        breast  . Heart disease Mother   . Hypertension Mother   . Asthma Other   . Hyperlipidemia Other     Past Surgical  History:  Procedure Laterality Date  . LAPAROSCOPIC APPENDECTOMY  03/14/2011   Procedure: APPENDECTOMY LAPAROSCOPIC;  Surgeon: Shann Medal, MD;  Location: [facility];  Service: General;  Laterality: N/A;  . TUBAL LIGATION      ROS: Review of Systems Negative except as stated above  PHYSICAL EXAM: BP 125/82 (BP Location: Left Arm, Patient Position: Sitting)   Pulse 68   Ht 5' 4.37" (1.635 m)   Wt 155 lb 12.8 oz (70.7 kg)   SpO2 99%   BMI 26.44 kg/m   Wt Readings from Last 3 Encounters:  06/06/20 155 lb 12.8 oz (70.7 kg)  05/04/20 155 lb (70.3 kg)  04/25/20 154 lb 6.4 oz (70 kg)    Physical Exam Constitutional:      Appearance: She is overweight.  HENT:     Head: Normocephalic and atraumatic.     Right Ear: Tympanic membrane, ear canal and external ear normal.     Left Ear: Tympanic membrane, ear canal and external ear normal.     Nose: Nose normal.     Mouth/Throat:     Mouth: Mucous membranes are moist.     Dentition: Abnormal dentition. Dental caries present.     Pharynx: Oropharynx is clear.  Eyes:     Extraocular Movements: Extraocular movements intact.     Conjunctiva/sclera: Conjunctivae normal.     Pupils: Pupils are equal, round, and reactive to light.  Cardiovascular:     Rate and Rhythm: Normal rate and regular rhythm.     Pulses: Normal pulses.     Heart sounds: Normal heart sounds.  Pulmonary:     Effort: Pulmonary effort is normal.     Breath sounds: Normal breath sounds.  Abdominal:     General: Bowel sounds are normal.     Palpations: Abdomen is soft.  Genitourinary:    Comments: Patient declined examination.  Musculoskeletal:        General: Normal range of motion.     Cervical back: Normal range of motion and neck supple.  Skin:    General: Skin is warm and dry.     Capillary Refill: Capillary refill takes less than 2 seconds.  Neurological:     General: No focal deficit present.     Mental Status: She is alert and oriented to person, place,  and time.  Psychiatric:        Mood and Affect: Mood normal.        Behavior: Behavior normal.     ASSESSMENT AND PLAN: 1. Annual physical exam: - Counseled on 150 minutes of exercise per week as tolerated, healthy eating (including decreased daily intake of saturated fats, cholesterol, added sugars, sodium), STI prevention, and routine healthcare maintenance.  2. Screening for metabolic disorder: - CMP last obtained 04/21/2020.  3. Screening for deficiency anemia: - CBC last obtained 04/21/2020.  4. Diabetes mellitus screening: - Hemoglobin A1c to screen for pre-diabetes/diabetes. - Hemoglobin A1c  5. Screening cholesterol level: - Lipid panel to screen for high cholesterol.  - Lipid Panel  6. Thyroid disorder screen: - TSH to check thyroid function.  - TSH  7. Need for hepatitis C screening test: - HCV antibody to screen for hepatitis C.  - HCV Ab w/Rflx to Verification  8. Encounter for screening for HIV: - HIV antibody to screen for human immunodeficiency virus.  - HIV antibody (with reflex)  9. Encounter for screening mammogram for malignant neoplasm of breast: - Referral for breast cancer screening by mammogram. - MM Digital Screening; Future  10. Cervical cancer screening: - Referral to Gynecology for cervical cancer screening by PAP smear. - Ambulatory referral to Gynecology  11. Colon cancer screening: - Referral to Gastroenterology for colon cancer screening by colonoscopy. - Ambulatory referral to Gastroenterology  12. Chronic pain of left knee: - Cyclobenzaprine as prescribed. May cause drowsiness. Counseled patient to not consume if operating heavy machinery or driving. Counseled patient to not consume with alcohol. - Patient reports she is unsure of the follow-up plan with Orthopedics. - Keep appointments with physical therapy. - Follow-up with primary provider as needed.  - cyclobenzaprine (FLEXERIL) 5 MG tablet; Take 1 tablet (5 mg total) by mouth  daily.  Dispense: 30 tablet; Refill: 0  13. Hot flashes due to menopause: - Gabapentin as prescribed for hot flashes.  - Follow-up with primary provider in 4 weeks or sooner if needed.  - gabapentin (NEURONTIN) 100 MG capsule; Take 1 capsule (100 mg total) by mouth daily.  Dispense: 30 capsule; Refill: 0  Patient was given the opportunity to ask questions.  Patient verbalized understanding of the plan and was able to repeat key elements of the plan. Patient was given clear instructions to go to Emergency Department or return to medical center if symptoms don't improve, worsen, or new problems develop.The patient verbalized understanding.   Orders Placed This Encounter  Procedures  . MM Digital Screening  . Hemoglobin A1c  . Lipid Panel  . TSH  . HIV antibody (with reflex)  . HCV Ab w/Rflx to Verification  . Interpretation:  . Ambulatory referral to Gastroenterology  . Ambulatory referral to Gynecology     Requested Prescriptions   Signed Prescriptions Disp Refills  . gabapentin (NEURONTIN) 100 MG capsule 30 capsule 0    Sig: Take 1 capsule (100 mg total) by mouth daily.  . cyclobenzaprine (FLEXERIL) 5 MG tablet 30 tablet 0    Sig: Take 1 tablet (5 mg total) by mouth daily.  Marland Kitchen atorvastatin (LIPITOR) 20 MG tablet 90 tablet 0    Sig: Take 1 tablet (20 mg total) by mouth daily.    Return in about 4 weeks (around 07/04/2020) for Telephone Visit for hotflashes follow-up.  Camillia Herter, NP

## 2020-06-06 NOTE — Patient Instructions (Addendum)
Annual physical exam and labs today. Will call with results.   Referral for mammogram.   Keep appointment for colonoscopy.   Flexeril for muscle spasms.   Gabapentin for hot flashes.   Follow-up with primary provider as needed.   Preventive Care 9-51 Years Old, Female  Preventive care refers to lifestyle choices and visits with your health care provider that can promote health and wellness. This includes:  A yearly physical exam. This is also called an annual wellness visit.  Regular dental and eye exams.  Immunizations.  Screening for certain conditions.  Healthy lifestyle choices, such as: ? Eating a healthy diet. ? Getting regular exercise. ? Not using drugs or products that contain nicotine and tobacco. ? Limiting alcohol use. What can I expect for my preventive care visit? Physical exam Your health care provider will check your:  Height and weight. These may be used to calculate your BMI (body mass index). BMI is a measurement that tells if you are at a healthy weight.  Heart rate and blood pressure.  Body temperature.  Skin for abnormal spots. Counseling Your health care provider may ask you questions about your:  Past medical problems.  Family's medical history.  Alcohol, tobacco, and drug use.  Emotional well-being.  Home life and relationship well-being.  Sexual activity.  Diet, exercise, and sleep habits.  Work and work Statistician.  Access to firearms.  Method of birth control.  Menstrual cycle.  Pregnancy history. What immunizations do I need? Vaccines are usually given at various ages, according to a schedule. Your health care provider will recommend vaccines for you based on your age, medical history, and lifestyle or other factors, such as travel or where you work.   What tests do I need? Blood tests  Lipid and cholesterol levels. These may be checked every 5 years, or more often if you are over 45 years old.  Hepatitis C  test.  Hepatitis B test. Screening  Lung cancer screening. You may have this screening every year starting at age 63 if you have a 30-pack-year history of smoking and currently smoke or have quit within the past 15 years.  Colorectal cancer screening. ? All adults should have this screening starting at age 94 and continuing until age 15. ? Your health care provider may recommend screening at age 56 if you are at increased risk. ? You will have tests every 1-10 years, depending on your results and the type of screening test.  Diabetes screening. ? This is done by checking your blood sugar (glucose) after you have not eaten for a while (fasting). ? You may have this done every 1-3 years.  Mammogram. ? This may be done every 1-2 years. ? Talk with your health care provider about when you should start having regular mammograms. This may depend on whether you have a family history of breast cancer.  BRCA-related cancer screening. This may be done if you have a family history of breast, ovarian, tubal, or peritoneal cancers.  Pelvic exam and Pap test. ? This may be done every 3 years starting at age 92. ? Starting at age 92, this may be done every 5 years if you have a Pap test in combination with an HPV test. Other tests  STD (sexually transmitted disease) testing, if you are at risk.  Bone density scan. This is done to screen for osteoporosis. You may have this scan if you are at high risk for osteoporosis. Talk with your health care provider about  your test results, treatment options, and if necessary, the need for more tests. Follow these instructions at home: Eating and drinking  Eat a diet that includes fresh fruits and vegetables, whole grains, lean protein, and low-fat dairy products.  Take vitamin and mineral supplements as recommended by your health care provider.  Do not drink alcohol if: ? Your health care provider tells you not to drink. ? You are pregnant, may be  pregnant, or are planning to become pregnant.  If you drink alcohol: ? Limit how much you have to 0-1 drink a day. ? Be aware of how much alcohol is in your drink. In the [place] one drink equals one 12 oz bottle of beer (355 mL), one 5 oz glass of wine (148 mL), or one 1 oz glass of hard liquor (44 mL).   Lifestyle  Take daily care of your teeth and gums. Brush your teeth every morning and night with fluoride toothpaste. Floss one time each day.  Stay active. Exercise for at least 30 minutes 5 or more days each week.  Do not use any products that contain nicotine or tobacco, such as cigarettes, e-cigarettes, and chewing tobacco. If you need help quitting, ask your health care provider.  Do not use drugs.  If you are sexually active, practice safe sex. Use a condom or other form of protection to prevent STIs (sexually transmitted infections).  If you do not wish to become pregnant, use a form of birth control. If you plan to become pregnant, see your health care provider for a prepregnancy visit.  If told by your health care provider, take low-dose aspirin daily starting at age 36.  Find healthy ways to cope with stress, such as: ? Meditation, yoga, or listening to music. ? Journaling. ? Talking to a trusted person. ? Spending time with friends and family. Safety  Always wear your seat belt while driving or riding in a vehicle.  Do not drive: ? If you have been drinking alcohol. Do not ride with someone who has been drinking. ? When you are tired or distracted. ? While texting.  Wear a helmet and other protective equipment during sports activities.  If you have firearms in your house, make sure you follow all gun safety procedures. What's next?  Visit your health care provider once a year for an annual wellness visit.  Ask your health care provider how often you should have your eyes and teeth checked.  Stay up to date on all vaccines. This information is not intended to  replace advice given to you by your health care provider. Make sure you discuss any questions you have with your health care provider. Document Revised: 01/03/2020 Document Reviewed: 12/10/2017 Elsevier Patient Education  2021 [employer].

## 2020-06-07 ENCOUNTER — Encounter: Payer: Self-pay | Admitting: Registered"

## 2020-06-07 ENCOUNTER — Other Ambulatory Visit: Payer: Self-pay | Admitting: Family

## 2020-06-07 ENCOUNTER — Encounter: Payer: No Typology Code available for payment source | Attending: Gastroenterology | Admitting: Registered"

## 2020-06-07 DIAGNOSIS — K219 Gastro-esophageal reflux disease without esophagitis: Secondary | ICD-10-CM | POA: Insufficient documentation

## 2020-06-07 DIAGNOSIS — E785 Hyperlipidemia, unspecified: Secondary | ICD-10-CM | POA: Insufficient documentation

## 2020-06-07 LAB — LIPID PANEL
Chol/HDL Ratio: 3.6 ratio (ref 0.0–4.4)
Cholesterol, Total: 245 mg/dL — ABNORMAL HIGH (ref 100–199)
HDL: 68 mg/dL (ref >39)
LDL Chol Calc (NIH): 157 mg/dL — ABNORMAL HIGH (ref 0–99)
Triglycerides: 115 mg/dL (ref 0–149)
VLDL Cholesterol Cal: 20 mg/dL (ref 5–40)

## 2020-06-07 LAB — HIV ANTIBODY (ROUTINE TESTING W REFLEX): HIV Screen 4th Generation wRfx: NONREACTIVE

## 2020-06-07 LAB — HEMOGLOBIN A1C
Est. average glucose Bld gHb Est-mCnc: 117 mg/dL
Hgb A1c MFr Bld: 5.7 % — ABNORMAL HIGH (ref 4.8–5.6)

## 2020-06-07 LAB — HCV INTERPRETATION

## 2020-06-07 LAB — TSH: TSH: 1.75 u[IU]/mL (ref 0.450–4.500)

## 2020-06-07 LAB — HCV AB W/RFLX TO VERIFICATION: HCV Ab: <0.1 s/co ratio (ref 0.0–0.9)

## 2020-06-07 MED ORDER — ATORVASTATIN CALCIUM 20 MG PO TABS: 20.0000 mg | ORAL_TABLET | Freq: Every day | ORAL | 0 refills | Status: DC

## 2020-06-07 MED ORDER — GABAPENTIN 100 MG PO CAPS: 100.0000 mg | ORAL_CAPSULE | Freq: Every day | ORAL | 0 refills | Status: DC

## 2020-06-07 MED ORDER — CYCLOBENZAPRINE HCL 5 MG PO TABS: 5.0000 mg | ORAL_TABLET | Freq: Every day | ORAL | 0 refills | Status: DC

## 2020-06-07 MED FILL — GABAPENTIN 100 MG CAPSULE: 100 | 30 days supply | Qty: 30 | Fill #0

## 2020-06-07 MED FILL — CYCLOBENZAPRINE 5 MG TABLET: 5 | 30 days supply | Qty: 30 | Fill #0

## 2020-06-07 NOTE — Progress Notes (Signed)
Medical Nutrition Therapy  Appointment Start time:  2:01  Appointment End time:  2:49  Primary concerns today: nutrition changes needed for GERD  Referral diagnosis: GERD Preferred learning style: no preference indicated Learning readiness: ready, change in progress   NUTRITION ASSESSMENT   States she has arthritis and has been falling more often lately due to left leg giving out. States protonix and omeprazole give her headaches. States she is drinking Mylanta and taking TUMS as needed.   States she is having a colonoscopy and endoscopy on 3/2.  States she tries not to eat tomatoes and does not drink lemonade or orange juice. States she has purchased a pillow to help reduce reflux when laying down at night and knows not to lie down within 3 hours of eating. States she goes to bed around 11-12 am.    Clinical Medical Hx: GERD Medications: See list Labs:  Notable Signs/Symptoms: reflux  Lifestyle & Dietary Hx   Estimated daily fluid intake: 80 oz Supplements: See list Sleep: 7-9 hrs Stress / self-care: none Current average weekly physical activity: none; will start physical therapy 3/7  24-Hr Dietary Recall First Meal (9 am): boiled egg + biscuit + sausage + water Snack:  Second Meal: typically skips Snack:  Third Meal (5 pm): baked chicken + white rice + broccoli + soda + water Snack: cookies + water  Beverages: water (3*16 oz, 48 oz), soda (1*20 oz; 20 oz), juice (2*6 oz, 12 oz), sweet tea (sometimes); 80 oz   NUTRITION DIAGNOSIS  NB-1.1 Food and nutrition-related knowledge deficit As related to GERD.  As evidenced by pt verbalizes incompleted knowledge.   NUTRITION INTERVENTION  Nutrition education (E-1) on the following topics: Nutrition education and counseling. Pt was educated and counseled on the benefits of listening to body related to GERD, nutritional ways to reduce signs/symptoms of GERD, ways to increase fiber intake, and reasons to increase physical  activity. Pt was encouraged to 3 meals/day to increase chances of having smaller meals and meeting nutritional needs daily. Pt was in agreement with goals listed.   Handouts Provided Include   GERD Nutrition Therapy  Learning Style & Readiness for Change Teaching method utilized: Visual & Auditory  Demonstrated degree of understanding via: Teach Back  Barriers to learning/adherence to lifestyle change: none identified  Goals Established by Pt  Aim to try not to skip lunch daily.   Use handout as guide to reduce intake of high fat meats.   Shop for lean ground meats aiming for at least 90% lean or more.   See handout for other recommendations/reminders.   MONITORING & EVALUATION Dietary intake, weekly physical activity prn.  Next Steps  Patient is to will follow-up prn.

## 2020-06-07 NOTE — Progress Notes (Signed)
Please call patient with update.   Thyroid normal.   Hepatitis C negative.   HIV negative.   Cholesterol higher than expected. High cholesterol may increase risk of heart attack and/or stroke. Consider eating more fruits, vegetables, and lean baked meats such as chicken or fish. Moderate intensity exercise at least 150 minutes as tolerated per week may help as well.   Atorvastatin 20 mg daily for high cholesterol.   Your hemoglobin A1c is consistent with pre-diabetes. Practice healthy eating habits of fresh fruit and vegetables, lean baked meats such as chicken, fish, and [place]; limit breads, rice, pastas, and desserts; practice regular aerobic exercise (at least 150 minutes a week as tolerated) and will recheck at next visit. Will not prescribe medication for now.

## 2020-06-07 NOTE — Patient Instructions (Signed)
-   Aim to try not to skip lunch daily.   - Use handout as guide to reduce intake of high fat meats.   - Shop for lean ground meats aiming for at least 90% lean or more.   - See handout for other recommendations/reminders.

## 2020-06-08 MED FILL — ATORVASTATIN CALCIUM 20 MG: 20 | 30 days supply | Qty: 30 | Fill #0

## 2020-06-13 ENCOUNTER — Ambulatory Visit (AMBULATORY_SURGERY_CENTER): Payer: No Typology Code available for payment source | Admitting: Gastroenterology

## 2020-06-13 ENCOUNTER — Encounter: Payer: Self-pay | Admitting: Gastroenterology

## 2020-06-13 ENCOUNTER — Other Ambulatory Visit: Payer: Self-pay

## 2020-06-13 VITALS — BP 158/94 | HR 67 | Temp 97.5°F | Resp 19 | Ht 64.0 in | Wt 155.0 lb

## 2020-06-13 DIAGNOSIS — B9681 Helicobacter pylori [H. pylori] as the cause of diseases classified elsewhere: Secondary | ICD-10-CM

## 2020-06-13 DIAGNOSIS — K2970 Gastritis, unspecified, without bleeding: Secondary | ICD-10-CM

## 2020-06-13 DIAGNOSIS — K219 Gastro-esophageal reflux disease without esophagitis: Secondary | ICD-10-CM

## 2020-06-13 DIAGNOSIS — K633 Ulcer of intestine: Secondary | ICD-10-CM

## 2020-06-13 DIAGNOSIS — Z1211 Encounter for screening for malignant neoplasm of colon: Secondary | ICD-10-CM

## 2020-06-13 DIAGNOSIS — K635 Polyp of colon: Secondary | ICD-10-CM

## 2020-06-13 DIAGNOSIS — D125 Benign neoplasm of sigmoid colon: Secondary | ICD-10-CM

## 2020-06-13 MED ORDER — SODIUM CHLORIDE 0.9 % IV SOLN
500.0000 mL | Freq: Once | INTRAVENOUS | Status: DC
Start: 2020-06-13 — End: 2020-06-13

## 2020-06-13 NOTE — Progress Notes (Signed)
Called to room to assist during endoscopic procedure.  Patient ID and intended procedure confirmed with present staff. Received instructions for my participation in the procedure from the performing physician.  

## 2020-06-13 NOTE — Progress Notes (Signed)
VS by CW  I have reviewed the patient's medical history in detail and updated the computerized patient record.  

## 2020-06-13 NOTE — Progress Notes (Signed)
Pt Drowsy. VSS. To PACU, report to RN. No anesthetic complications noted.  

## 2020-06-13 NOTE — Patient Instructions (Signed)
Discharge instructions given. Biopsies taken. No aspirin,ibuprofen,naproxen,or other non-steriodal anti-inflammatory drugs. Handout on polyps. Avoid all NSAIDS. RESUME PREVIOUS MEDICATIONS. YOU HAD AN ENDOSCOPIC PROCEDURE TODAY AT [facility]:   Refer to the procedure report that was given to you for any specific questions about what was found during the examination.  If the procedure report does not answer your questions, please call your gastroenterologist to clarify.  If you requested that your care partner not be given the details of your procedure findings, then the procedure report has been included in a sealed envelope for you to review at your convenience later.  YOU SHOULD EXPECT: Some feelings of bloating in the abdomen. Passage of more gas than usual.  Walking can help get rid of the air that was put into your GI tract during the procedure and reduce the bloating. If you had a lower endoscopy (such as a colonoscopy or flexible sigmoidoscopy) you may notice spotting of blood in your stool or on the toilet paper. If you underwent a bowel prep for your procedure, you may not have a normal bowel movement for a few days.  Please Note:  You might notice some irritation and congestion in your nose or some drainage.  This is from the oxygen used during your procedure.  There is no need for concern and it should clear up in a day or so.  SYMPTOMS TO REPORT IMMEDIATELY:   Following lower endoscopy (colonoscopy or flexible sigmoidoscopy):  Excessive amounts of blood in the stool  Significant tenderness or worsening of abdominal pains  Swelling of the abdomen that is new, acute  Fever of 100F or higher   Following upper endoscopy (EGD)  Vomiting of blood or coffee ground material  New chest pain or pain under the shoulder blades  Painful or persistently difficult swallowing  New shortness of breath  Fever of 100F or higher  Black, tarry-looking stools  For urgent or  emergent issues, a gastroenterologist can be reached at any hour by calling [phone]. Do not use MyChart messaging for urgent concerns.    DIET:  We do recommend a small meal at first, but then you may proceed to your regular diet.  Drink plenty of fluids but you should avoid alcoholic beverages for 24 hours.  ACTIVITY:  You should plan to take it easy for the rest of today and you should NOT DRIVE or use heavy machinery until tomorrow (because of the sedation medicines used during the test).    FOLLOW UP: Our staff will call the number listed on your records 48-72 hours following your procedure to check on you and address any questions or concerns that you may have regarding the information given to you following your procedure. If we do not reach you, we will leave a message.  We will attempt to reach you two times.  During this call, we will ask if you have developed any symptoms of COVID 19. If you develop any symptoms (ie: fever, flu-like symptoms, shortness of breath, cough etc.) before then, please call [phone].  If you test positive for Covid 19 in the 2 weeks post procedure, please call and report this information to [place].    If any biopsies were taken you will be contacted by phone or by letter within the next 1-3 weeks.  Please call us at [phone] if you have not heard about the biopsies in 3 weeks.    SIGNATURES/CONFIDENTIALITY: You and/or your care partner have signed paperwork which will  be entered into your electronic medical record.  These signatures attest to the fact that that the information above on your After Visit Summary has been reviewed and is understood.  Full responsibility of the confidentiality of this discharge information lies with you and/or your care-partner. 

## 2020-06-13 NOTE — Op Note (Signed)
[facility] Patient Name: Nicole Cisneros Procedure Date: 06/13/2020 3:18 PM MRN: [id number] Endoscopist: Thornton Park MD, MD Age: 50 Referring MD:  Date of Birth: 21-Jan-1971 Gender: Female Account #: [id number] Procedure:                Upper GI endoscopy Indications:              Esophageal reflux symptoms that persist despite                            appropriate therapy Medicines:                Monitored Anesthesia Care Procedure:                Pre-Anesthesia Assessment:                           - Prior to the procedure, a History and Physical                            was performed, and patient medications and                            allergies were reviewed. The patient's tolerance of                            previous anesthesia was also reviewed. The risks                            and benefits of the procedure and the sedation                            options and risks were discussed with the patient.                            All questions were answered, and informed consent                            was obtained. Prior Anticoagulants: The patient has                            taken no previous anticoagulant or antiplatelet                            agents. ASA Grade Assessment: II - A patient with                            mild systemic disease. After reviewing the risks                            and benefits, the patient was deemed in                            satisfactory condition to undergo the procedure.  After obtaining informed consent, the endoscope was                            passed under direct vision. Throughout the                            procedure, the patient's blood pressure, pulse, and                            oxygen saturations were monitored continuously. The                            Endoscope was introduced through the mouth, and                            advanced to the third part of  duodenum. The upper                            GI endoscopy was accomplished without difficulty.                            The patient tolerated the procedure well. Scope In: Scope Out: Findings:                 The examined esophagus was normal except for mild                            congestion. Biopsies were taken from the                            mid/proximal and distal esophagus with a cold                            forceps for histology.                           Diffuse mildly erythematous mucosa without bleeding                            was found in the gastric body. Biopsies were taken                            from the antrum, body, and fundus with a cold                            forceps for histology. Estimated blood loss was                            minimal.                           The examined duodenum was normal.                           The cardia and gastric fundus were normal  on                            retroflexion.                           The exam was otherwise without abnormality. Complications:            No immediate complications. Estimated blood loss:                            Minimal. Estimated Blood Loss:     Estimated blood loss was minimal. Impression:               - Normal esophagus. Biopsied.                           - Erythematous mucosa in the gastric body. Biopsied.                           - Normal examined duodenum.                           - The examination was otherwise normal. Recommendation:           - Patient has a contact number available for                            emergencies. The signs and symptoms of potential                            delayed complications were discussed with the                            patient. Return to normal activities tomorrow.                            Written discharge instructions were provided to the                            patient.                           - Resume previous  diet.                           - Continue present medications including                            pantropazole 40 mg BID.                           - No aspirin, ibuprofen, naproxen, or other                            non-steroidal anti-inflammatory drugs.                           - Await pathology  results.                           - Proceed with colonoscopy as previously planned. Thornton Park MD, MD 06/13/2020 3:51:54 PM This report has been signed electronically.

## 2020-06-13 NOTE — Op Note (Signed)
[facility] Patient Name: Nicole Cisneros Procedure Date: 06/13/2020 3:17 PM MRN: [id number] Endoscopist: Thornton Park MD, MD Age: 50 Referring MD:  Date of Birth: 28-Oct-1970 Gender: Female Account #: [id number] Procedure:                Colonoscopy Indications:              Screening for colorectal malignant neoplasm, This                            is the patient's first colonoscopy                           No known family history of colon cancer or polyps Medicines:                Monitored Anesthesia Care Procedure:                Pre-Anesthesia Assessment:                           - Prior to the procedure, a History and Physical                            was performed, and patient medications and                            allergies were reviewed. The patient's tolerance of                            previous anesthesia was also reviewed. The risks                            and benefits of the procedure and the sedation                            options and risks were discussed with the patient.                            All questions were answered, and informed consent                            was obtained. Prior Anticoagulants: The patient has                            taken no previous anticoagulant or antiplatelet                            agents. ASA Grade Assessment: II - A patient with                            mild systemic disease. After reviewing the risks                            and benefits, the patient was deemed in  satisfactory condition to undergo the procedure.                           After obtaining informed consent, the colonoscope                            was passed under direct vision. Throughout the                            procedure, the patient's blood pressure, pulse, and                            oxygen saturations were monitored continuously. The                            Colonoscope was  introduced through the anus and                            advanced to the 10 cm into the ileum. A second                            forward view of the right colon was performed. The                            colonoscopy was performed without difficulty. The                            patient tolerated the procedure well. The quality                            of the bowel preparation was good. The terminal                            ileum, ileocecal valve, appendiceal orifice, and                            rectum were photographed. Scope In: 3:32:45 PM Scope Out: 3:47:02 PM Scope Withdrawal Time: 0 hours 10 minutes 23 seconds  Total Procedure Duration: 0 hours 14 minutes 17 seconds  Findings:                 The perianal and digital rectal examinations were                            normal.                           A 1 mm polyp was found in the distal sigmoid colon.                            The polyp was flat. The polyp was removed with a                            cold biopsy forceps. Resection and retrieval were  complete. Estimated blood loss was minimal.                           The entire examined colon appeared normal. Biopsies                            were taken throughout the colon with a cold forceps                            for histology. Estimated blood loss was minimal.                           The terminal ileum contained multiple patchy                            non-bleeding aphthae throughout the distal 10cm                            that was examined. This was biopsied with a cold                            forceps for histology. Estimated blood loss was                            minimal.                           The exam was otherwise without abnormality on                            direct and retroflexion views. Complications:            No immediate complications. Estimated blood loss:                             Minimal. Estimated Blood Loss:     Estimated blood loss was minimal. Impression:               - One 1 mm polyp in the distal sigmoid colon,                            removed with a cold biopsy forceps. Resected and                            retrieved.                           - The entire examined colon is normal. Biopsied.                           - Aphtha in the terminal ileum. This may be related                            to medications such as ibuprofen, Motrin, Goodys,  BCs, Aleve, etc. Biopsied.                           - The examination was otherwise normal on direct                            and retroflexion views. Recommendation:           - Patient has a contact number available for                            emergencies. The signs and symptoms of potential                            delayed complications were discussed with the                            patient. Return to normal activities tomorrow.                            Written discharge instructions were provided to the                            patient.                           - Resume previous diet.                           - Continue present medications.                           - Avoid all NSAIDs.                           - Await pathology results.                           - Repeat colonoscopy date to be determined after                            pending pathology results are reviewed for                            surveillance.                           - Emerging evidence supports eating a diet of                            fruits, vegetables, grains, calcium, and yogurt                            while reducing red meat and alcohol may reduce the                            risk of colon cancer.                           -  Thank you for allowing me to be involved in your                            colon cancer prevention. Thornton Park MD, MD 06/13/2020 4:02:59  PM This report has been signed electronically.

## 2020-06-13 NOTE — Progress Notes (Signed)
Lidocaine 100mg IV given to blunt gag reflex 

## 2020-06-15 ENCOUNTER — Telehealth: Payer: Self-pay | Admitting: *Deleted

## 2020-06-15 NOTE — Telephone Encounter (Signed)
  Follow up Call-  Call back number 06/13/2020  Post procedure Call Back phone  # [phone]  Permission to leave phone message Yes  Some recent data might be hidden     Patient questions:  VM box is not set up.

## 2020-06-15 NOTE — Telephone Encounter (Signed)
  Follow up Call-  Call back number 06/13/2020  Post procedure Call Back phone  # [phone]  Permission to leave phone message Yes  Some recent data might be hidden     Patient questions:  Do you have a fever, pain , or abdominal swelling? No. Pain Score  0 *  Have you tolerated food without any problems? Yes.    Have you been able to return to your normal activities? Yes.    Do you have any questions about your discharge instructions: Diet   No. Medications  No. Follow up visit  No.  Do you have questions or concerns about your Care? No.  Actions: * If pain score is 4 or above: No action needed, pain <4.  1. Have you developed a fever since your procedure? no  2.   Have you had an respiratory symptoms (SOB or cough) since your procedure? no  3.   Have you tested positive for COVID 19 since your procedure no  4.   Have you had any family members/close contacts diagnosed with the COVID 19 since your procedure? no   If yes to any of these questions please route to Joylene John, RN and Joella Prince, RN

## 2020-06-18 ENCOUNTER — Ambulatory Visit: Payer: No Typology Code available for payment source | Attending: Physician Assistant

## 2020-06-18 ENCOUNTER — Other Ambulatory Visit: Payer: Self-pay

## 2020-06-18 DIAGNOSIS — M6281 Muscle weakness (generalized): Secondary | ICD-10-CM | POA: Insufficient documentation

## 2020-06-18 DIAGNOSIS — M79605 Pain in left leg: Secondary | ICD-10-CM | POA: Insufficient documentation

## 2020-06-18 DIAGNOSIS — R262 Difficulty in walking, not elsewhere classified: Secondary | ICD-10-CM | POA: Insufficient documentation

## 2020-06-18 DIAGNOSIS — M545 Low back pain, unspecified: Secondary | ICD-10-CM | POA: Insufficient documentation

## 2020-06-18 DIAGNOSIS — G8929 Other chronic pain: Secondary | ICD-10-CM | POA: Insufficient documentation

## 2020-06-18 NOTE — Therapy (Signed)
[place], [place], [address] Phone: [phone]   Fax:  [phone]  Physical Therapy Evaluation  Patient Details  Name: Nicole Cisneros MRN: [id number] Date of Birth: [birth year] Referring Provider (PT): Pete Pelt, [place]   Encounter Date: 06/18/2020   PT End of Session - 06/18/20 1116    Visit Number 1    Number of Visits 17    Date for PT Re-Evaluation 08/18/20    Authorization Type CAFA 1/19-7/19/22    PT Start Time 1100    PT Stop Time 1145    PT Time Calculation (min) 45 min    Activity Tolerance Patient tolerated treatment well    Behavior During Therapy [facility] for tasks assessed/performed           Past Medical History:  Diagnosis Date  . Anemia   . Constipation   . COPD (chronic obstructive pulmonary disease) ([place])   . Cough   . GERD (gastroesophageal reflux disease)   . Headache(784.0)   . Migraine   . Sore throat   . Weakness   . Weight loss     Past Surgical History:  Procedure Laterality Date  . LAPAROSCOPIC APPENDECTOMY  03/14/2011   Procedure: APPENDECTOMY LAPAROSCOPIC;  Surgeon: Shann Medal, MD;  Location: [place];  Service: General;  Laterality: N/A;  . TUBAL LIGATION      There were no vitals filed for this visit.    Subjective Assessment - 06/18/20 1059    Subjective "It's to the point where I'm having to put more pressure on my Rt side because my Lt side hurts too much, sometimes this whole leg will do what it wants to do and sometimes it doesn't." She reports having injection in the Lt hip about a month ago, but doesn't think it helped with her pain. She reports the pain has been ongoing for years with recent excerbation of pain in her legs and back in December that could be attributed to a fall. 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 She reports occasional numbness/tingling along  dorsal aspect of both feet and reports the pain can be in both legs at times, but is mostly on the left.She denies saddle parasthesia or changes in bowel/bladder.    Pertinent History see PMH above    Limitations Walking;Standing;Lifting;Sitting;House hold activities    How long can you sit comfortably? not long, about a good 30 minutes    How long can you stand comfortably? not long, about 30 minutes    How long can you walk comfortably? I can't tell you, I have to hold on to something    Diagnostic tests Lumbar spine 2 views: No acute fractures.  No spondylolisthesis.  Disc   space well-maintained.  Normal lordotic curvature.  AP view does show   bilateral hips to be well located and hip joints appear well-preserved but   they are not completely visualized. Right knee 2 views: Knee is well located.  No acute fractures.  No bony   abnormalities.  No significant arthritic changes are noted all 3 joints   well-preserved. Left knee 2 views: No acute fractures.  Knee is well located.  Knee joint   appears well-preserved.  No significant arthritic changes.  No bony   abnormalities otherwise    Patient Stated Goals I want to be able to be a little better, I  want to try to get back to work    Currently in Pain? Yes    Pain Score 6     Pain Location Leg    Pain Orientation Left    Pain Descriptors / Indicators Aching    Pain Type Chronic pain    Pain Radiating Towards LLE    Pain Onset More than a month ago    Pain Frequency Intermittent    Aggravating Factors  walking, standing    Pain Relieving Factors rest, biofreeze    Effect of Pain on Daily Activities reports inability to work              [facility] PT Assessment - 06/18/20 0001      Assessment   Medical Diagnosis Chronic bilateral low back pain, unspecified whether sciatica present  M70.62 (ICD-10-CM) - Trochanteric bursitis, left hip    Referring Provider (PT) Pete Pelt, PA-C    Onset Date/Surgical Date --   for years with recent  flare up in December 2021   Hand Dominance Left    Next MD Visit none scheduled    Prior Therapy yes      Precautions   Precautions None      Restrictions   Weight Bearing Restrictions No      Balance Screen   Has the patient fallen in the past 6 months Yes    How many times? "all the time sometimes the leg gives out"    Has the patient had a decrease in activity level because of a fear of falling?  Yes    Is the patient reluctant to leave their home because of a fear of falling?  No      Home Ecologist residence    Living Arrangements Spouse/significant other    Type of Home Apartment    Additional Comments no stairs      Prior Function   Vocation Unemployed    [place] Requirements previously worked as Proofreader Status Within Functional Limits for tasks assessed      Observation/Other Assessments   Observations repositions multiple times while seated    Focus on Therapeutic Outcomes (FOTO)  7% hip 42% predicted; 26% lumbar 48% predicted      Sensation   Light Touch Not tested      Posture/Postural Control   Posture Comments Lt lateral shift in standing      AROM   Lumbar Flexion fingertips to toes    Lumbar Extension WFL    Lumbar - Right Side Bend WFL pain BLE    Lumbar - Left Side Bend WFL    Lumbar - Right Rotation WFL pain in low back    Lumbar - Left Rotation [facility]      Strength   Overall Strength Comments bilateral ankle strength WNL    Right Hip Flexion 5/5    Right Hip Extension 4/5    Right Hip ABduction 4+/5    Left Hip Flexion 3+/5    Left Hip Extension 4/5    Left Hip ABduction 4/5    Right Knee Flexion 5/5    Right Knee Extension 5/5    Left Knee Flexion 4+/5    Left Knee Extension 4-/5      Flexibility   Hamstrings WNL bilaterally    Quadriceps 50% limitation bilaterally      Palpation   Spinal mobility pain before restriction CPA L1-L5  Palpation comment symmetrical  pelvic alignment      Special Tests   Other special tests (+) SLR LLE (+) Ober's test bilaterally      Ambulation/Gait   Ambulation/Gait Yes    Gait Pattern Decreased step length - right;Decreased stance time - left;Left foot flat                      Objective measurements completed on examination: See above findings.       [place] Adult PT Treatment/Exercise - 06/18/20 0001      Self-Care   Self-Care Other Self-Care Comments    Other Self-Care Comments  see patient education      Lumbar Exercises: Standing   Other Standing Lumbar Exercises left lateral shift correction x 10                  PT Education - 06/18/20 1116    Education Details Current condition, POC, HEP.    Person(s) Educated Patient    Methods Explanation;Handout;Verbal cues    Comprehension Verbalized understanding;Returned demonstration;Verbal cues required            PT Short Term Goals - 06/18/20 1117      PT SHORT TERM GOAL #1   Title Patient will be independent with initial HEP.    Baseline issued at eval    Time 3    Period Weeks    Status New    Target Date 07/09/20      PT SHORT TERM GOAL #2   Title PT will review FOTO score and predicted functional progress.    Baseline FOTO score captured at eval.    Time 1    Period Weeks    Status New    Target Date 06/25/20      PT SHORT TERM GOAL #3   Title Patient will demonstrate proper heel strike and toe off on LLE and equal step length.    Baseline foot flat initial contact, limited toe off.    Time 4    Period Weeks    Status New    Target Date 07/16/20      PT SHORT TERM GOAL #4   Title Patient will demonstrate resolution of left lateral shift.    Baseline left lateral shift in standing    Time 4    Period Weeks    Status New    Target Date 07/16/20             PT Long Term Goals - 06/18/20 1353      PT LONG TERM GOAL #1   Title Patient will demonstrate 5/5 strength in Lt knee to improve ability to  complete squatting and bending activity.    Baseline see flowsheet    Time 8    Period Weeks    Status New    Target Date 08/13/20      PT LONG TERM GOAL #2   Title Patient will demonstrate at least 4+/5 strength in bilateral hips to improve stability about the chain with prolonged walking and standing activity.    Baseline see flowsheet    Time 8    Period Weeks    Status New    Target Date 08/13/20      PT LONG TERM GOAL #3   Title Patient will demonstrate pain free lumbar AROM to improve tolerance to reaching and bending activity.    Baseline see flowsheet    Time 8    Period Weeks  Status New    Target Date 08/13/20      PT LONG TERM GOAL #4   Title Patient will self-report tolerating at least 10 minutes of walking activity to improve ability to complete work and community activities.    Baseline patient reports unable to walk for any amount of time without holding onto something    Time 8    Period Weeks    Status New    Target Date 08/13/20                  Plan - 06/18/20 1116    Clinical Impression Statement Patient is a 50 y/o female with chief complaint of acute on chronic low back and BLE pain (Lt>Rt) with recent worsening of pain in December 2021 that patient attributes to a fall. She has recently received Lt hip injection without improvements in pain and completed PT in 2016 for similar complaints reporting some pain relief from prior PT. Overall she has good lumbar AROM, though pain reported with Rt rotation and side bending. She has Lt lateral shift in standing demonstrating initial positive response to lateral shift correction at eval centralizing pain from Lt popliteal fossa to posterior thigh, though requires further assessment of overall response to repeated movements at future sessions to determine centralization of pain. In addition she has BLE weakness (Lt>Rt), limited quad and IT band flexibility bilaterally, antalgic gait, and activity limitations  secondary to pain. She will benefit from skilled PT to address above stated deficits in order to optimize function.    Personal Factors and Comorbidities Past/Current Experience;Profession;Time since onset of injury/illness/exacerbation;Age    Examination-Activity Limitations Bend;Carry;Lift;Locomotion Level;Sit;Squat;Stairs;Stand    Examination-Participation Restrictions Cleaning;Occupation;Community Activity;Laundry;Shop    Stability/Clinical Decision Making Evolving/Moderate complexity    Clinical Decision Making Moderate    Rehab Potential Good    PT Frequency 2x / week    PT Duration 8 weeks    PT Treatment/Interventions ADLs/Self Care Home Management;Electrical Stimulation;Moist Heat;Traction;Gait training;Stair training;Therapeutic activities;Therapeutic exercise;Balance training;Neuromuscular re-education;Patient/family education;Manual techniques;Passive range of motion;Dry needling;Taping;Spinal Manipulations    PT Next Visit Plan review and update HEP, assess repeated movements, review FOTO    PT Home Exercise Plan Access Code [email or web address]    Consulted and Agree with Plan of Care Patient           Patient will benefit from skilled therapeutic intervention in order to improve the following deficits and impairments:  Abnormal gait,Decreased range of motion,Difficulty walking,Decreased activity tolerance,Pain,Impaired flexibility,Decreased strength,Postural dysfunction  Visit Diagnosis: Chronic bilateral low back pain, unspecified whether sciatica present  Muscle weakness (generalized)  Difficulty in walking, not elsewhere classified     Problem List Patient Active Problem List   Diagnosis Date Noted  . Hyperlipidemia 06/07/2020  . Myalgias, multiple 10/23/2014  . Pain in joint, shoulder region 09/14/2014  . Left knee pain 06/21/2014  . GERD (gastroesophageal reflux disease) 03/14/2013  . Anemia 03/14/2013  . Smoking 03/14/2013  . Appendicitis 03/14/2011   Gwendolyn Grant,  PT, DPT, ATC 06/18/20 2:26 PM  [facility] [address] [place], [place], [address] Phone: [phone]   Fax:  [phone]  Name: ALANNAH AVERHART MRN: [id number] Date of Birth: April 12, 1971

## 2020-06-20 ENCOUNTER — Ambulatory Visit: Payer: No Typology Code available for payment source

## 2020-06-20 ENCOUNTER — Other Ambulatory Visit: Payer: Self-pay

## 2020-06-20 DIAGNOSIS — M79605 Pain in left leg: Secondary | ICD-10-CM

## 2020-06-20 DIAGNOSIS — M6281 Muscle weakness (generalized): Secondary | ICD-10-CM

## 2020-06-20 DIAGNOSIS — R262 Difficulty in walking, not elsewhere classified: Secondary | ICD-10-CM

## 2020-06-20 DIAGNOSIS — G8929 Other chronic pain: Secondary | ICD-10-CM

## 2020-06-20 NOTE — Therapy (Signed)
[place], [place], [address] Phone: [phone]   Fax:  [phone]  Physical Therapy Treatment  Patient Details  Name: Nicole Cisneros MRN: [id number] Date of Birth: 1970-07-13 Referring Provider (PT): Pete Pelt, [place]   Encounter Date: 06/20/2020   PT End of Session - 06/20/20 1738    Visit Number 2    Number of Visits 17    Date for PT Re-Evaluation 08/18/20    Authorization Type CAFA 1/19-7/19/22    PT Start Time 5102    PT Stop Time 1810    PT Time Calculation (min) 43 min    Equipment Utilized During Treatment Other (comment)   SPC   Activity Tolerance Patient tolerated treatment well    Behavior During Therapy [facility] for tasks assessed/performed           Past Medical History:  Diagnosis Date  . Anemia   . Constipation   . COPD (chronic obstructive pulmonary disease) ([place])   . Cough   . GERD (gastroesophageal reflux disease)   . Headache(784.0)   . Migraine   . Sore throat   . Weakness   . Weight loss     Past Surgical History:  Procedure Laterality Date  . LAPAROSCOPIC APPENDECTOMY  03/14/2011   Procedure: APPENDECTOMY LAPAROSCOPIC;  Surgeon: Shann Medal, MD;  Location: [place];  Service: General;  Laterality: N/A;  . TUBAL LIGATION      There were no vitals filed for this visit.   Subjective Assessment - 06/20/20 1730    Subjective patient reports she is feeling about the same since evaluation. She has been completing her HEP, but it hurts.    Pertinent History see PMH above    Limitations Walking;Standing;Lifting;Sitting;House hold activities    How long can you sit comfortably? not long, about a good 30 minutes    How long can you stand comfortably? not long, about 30 minutes    How long can you walk comfortably? I can't tell you, I have to hold on to something    Diagnostic tests Lumbar spine 2 views: No acute fractures.  No spondylolisthesis.  Disc   space  well-maintained.  Normal lordotic curvature.  AP view does show   bilateral hips to be well located and hip joints appear well-preserved but   they are not completely visualized. Right knee 2 views: Knee is well located.  No acute fractures.  No bony   abnormalities.  No significant arthritic changes are noted all 3 joints   well-preserved. Left knee 2 views: No acute fractures.  Knee is well located.  Knee joint   appears well-preserved.  No significant arthritic changes.  No bony   abnormalities otherwise    Patient Stated Goals I want to be able to be a little better, I want to try to get back to work    Currently in Pain? Yes    Pain Score 8     Pain Location Leg    Pain Orientation Right;Left    Pain Descriptors / Indicators Aching    Pain Type Chronic pain    Pain Onset More than a month ago                             [facility] Adult PT Treatment/Exercise - 06/20/20 0001      Lumbar Exercises: Standing   Other Standing Lumbar Exercises left lateral shift correction 1  x10    Other Standing Lumbar Exercises repeated trunk extension 1 x 10      Lumbar Exercises: Supine   Other Supine Lumbar Exercises Sciative nerve glide 1 x 10; LLE      Lumbar Exercises: Prone   Other Prone Lumbar Exercises lumbar extension 1 x 10      Knee/Hip Exercises: Standing   Gait Training with SPC x 15 minutes focusing on heel strike/toe off, step length and weight shift to the Lt                  PT Education - 06/20/20 1827    Education Details Education on centralization phenomenon. Recommended purchasing SPC. Updated HEP. Posture education    Person(s) Educated Patient    Methods Explanation;Handout;Verbal cues;Demonstration    Comprehension Verbalized understanding;Returned demonstration;Verbal cues required            PT Short Term Goals - 06/18/20 1117      PT SHORT TERM GOAL #1   Title Patient will be independent with initial HEP.    Baseline issued at eval     Time 3    Period Weeks    Status New    Target Date 07/09/20      PT SHORT TERM GOAL #2   Title PT will review FOTO score and predicted functional progress.    Baseline FOTO score captured at eval.    Time 1    Period Weeks    Status New    Target Date 06/25/20      PT SHORT TERM GOAL #3   Title Patient will demonstrate proper heel strike and toe off on LLE and equal step length.    Baseline foot flat initial contact, limited toe off.    Time 4    Period Weeks    Status New    Target Date 07/16/20      PT SHORT TERM GOAL #4   Title Patient will demonstrate resolution of left lateral shift.    Baseline left lateral shift in standing    Time 4    Period Weeks    Status New    Target Date 07/16/20             PT Long Term Goals - 06/18/20 1353      PT LONG TERM GOAL #1   Title Patient will demonstrate 5/5 strength in Lt knee to improve ability to complete squatting and bending activity.    Baseline see flowsheet    Time 8    Period Weeks    Status New    Target Date 08/13/20      PT LONG TERM GOAL #2   Title Patient will demonstrate at least 4+/5 strength in bilateral hips to improve stability about the chain with prolonged walking and standing activity.    Baseline see flowsheet    Time 8    Period Weeks    Status New    Target Date 08/13/20      PT LONG TERM GOAL #3   Title Patient will demonstrate pain free lumbar AROM to improve tolerance to reaching and bending activity.    Baseline see flowsheet    Time 8    Period Weeks    Status New    Target Date 08/13/20      PT LONG TERM GOAL #4   Title Patient will self-report tolerating at least 10 minutes of walking activity to improve ability to complete work and community activities.  Baseline patient reports unable to walk for any amount of time without holding onto something    Time 8    Period Weeks    Status New    Target Date 08/13/20                 Plan - 06/20/20 1811    Clinical  Impression Statement Patient demonstrates initial positive response to left lateral shift correction and repeated lumbar extension with pain centralizing from bilateral popliteal fossa to low back. Session focused on gait training with SPC with patient demonstrating improved weight shift to the LLE and increased stance time on the LLE with use of AD. While her gait speed is slow with SPC this will likely improve with continued practice as her speed and confidence with using AD improved during session. It was recommended that patient obtain a SPC at this time to assist in normalizing her gait mechanics, which has the potential to assist in overall pain reduction.    Personal Factors and Comorbidities Past/Current Experience;Profession;Time since onset of injury/illness/exacerbation;Age    Examination-Activity Limitations Bend;Carry;Lift;Locomotion Level;Sit;Squat;Stairs;Stand    Examination-Participation Restrictions Cleaning;Occupation;Community Activity;Laundry;Shop    Stability/Clinical Decision Making Evolving/Moderate complexity    Rehab Potential Good    PT Frequency 2x / week    PT Duration 8 weeks    PT Treatment/Interventions ADLs/Self Care Home Management;Electrical Stimulation;Moist Heat;Traction;Gait training;Stair training;Therapeutic activities;Therapeutic exercise;Balance training;Neuromuscular re-education;Patient/family education;Manual techniques;Passive range of motion;Dry needling;Taping;Spinal Manipulations    PT Next Visit Plan assess response to repeated extension; did patient get a cane?? continue gait training, implement LE strengthening and add to HEP.    PT Home Exercise Plan Access Code [email or web address]    Consulted and Agree with Plan of Care Patient           Patient will benefit from skilled therapeutic intervention in order to improve the following deficits and impairments:  Abnormal gait,Decreased range of motion,Difficulty walking,Decreased activity tolerance,Pain,Impaired  flexibility,Decreased strength,Postural dysfunction  Visit Diagnosis: Chronic bilateral low back pain, unspecified whether sciatica present  Muscle weakness (generalized)  Difficulty in walking, not elsewhere classified  Pain in left leg     Problem List Patient Active Problem List   Diagnosis Date Noted  . Hyperlipidemia 06/07/2020  . Myalgias, multiple 10/23/2014  . Pain in joint, shoulder region 09/14/2014  . Left knee pain 06/21/2014  . GERD (gastroesophageal reflux disease) 03/14/2013  . Anemia 03/14/2013  . Smoking 03/14/2013  . Appendicitis 03/14/2011   Gwendolyn Grant, PT, DPT, ATC 06/20/20 6:34 PM  [facility] [address] [place], [place], [address] Phone: [phone]   Fax:  [phone]  Name: Nicole Cisneros MRN: [id number] Date of Birth: 01-Mar-1971

## 2020-06-21 ENCOUNTER — Telehealth: Payer: Self-pay | Admitting: Gastroenterology

## 2020-06-21 ENCOUNTER — Telehealth: Payer: Self-pay | Admitting: Family

## 2020-06-21 ENCOUNTER — Other Ambulatory Visit: Payer: Self-pay

## 2020-06-21 MED ORDER — BISMUTH 262 MG PO CHEW: 524.0000 mg | CHEWABLE_TABLET | Freq: Four times a day (QID) | ORAL | 0 refills | Status: DC

## 2020-06-21 MED ORDER — PANTOPRAZOLE SODIUM 40 MG PO TBEC: 40.0000 mg | DELAYED_RELEASE_TABLET | Freq: Two times a day (BID) | ORAL | 0 refills | Status: DC

## 2020-06-21 MED ORDER — BIS SUBCIT-METRONID-TETRACYC 140-125-125 MG PO CAPS: 3.0000 | ORAL_CAPSULE | Freq: Three times a day (TID) | ORAL | 0 refills | Status: DC

## 2020-06-21 MED ORDER — TETRACYCLINE HCL 500 MG PO CAPS: 500.0000 mg | ORAL_CAPSULE | Freq: Four times a day (QID) | ORAL | 0 refills | Status: DC

## 2020-06-21 MED ORDER — METRONIDAZOLE 500 MG PO TABS: 500.0000 mg | ORAL_TABLET | Freq: Four times a day (QID) | ORAL | 0 refills | Status: DC

## 2020-06-21 NOTE — Telephone Encounter (Signed)
New scripts sent to pharmacy

## 2020-06-21 NOTE — Telephone Encounter (Signed)
Requesting an alternative medication because the Pylera is too expensive.

## 2020-06-21 NOTE — Telephone Encounter (Signed)
Thank you for the update. Unfortunately, I do not know of any other way to successfully treat H pylori.

## 2020-06-21 NOTE — Telephone Encounter (Signed)
Pt called and states she cannot take any antibiotics, states they all make her sick. Discussed with her that she only has amoxicillin and erythromycin listed as allergies. Pt states she can't take any antibiotic, they all make her sick. Pt state she will pick them up and try them. Dr. Tarri Glenn notified.

## 2020-06-21 NOTE — Telephone Encounter (Addendum)
Patient called and said she was given Tetracycline antibiotics and she can not take antibiotics please advise.

## 2020-06-21 NOTE — Telephone Encounter (Signed)
Pt called saying physical therapist recommended walking cane. Pt says unsure what kind to get & request PCP prescribe correct kind pt should have.  Pt ph [phone].

## 2020-06-22 MED FILL — TETRACYCLINE HCL 500 MG CAP: 500 | 14 days supply | Qty: 56 | Fill #0

## 2020-06-22 MED FILL — metroNIDAZOLE 500 MG TABS: 500 | 14 days supply | Qty: 56 | Fill #0

## 2020-06-22 NOTE — Telephone Encounter (Signed)
Attempted to call pt back but got message that "voice mailbox has not been set up yet" and then it hung up.

## 2020-06-22 NOTE — Telephone Encounter (Signed)
GI colonoscopy says problems arise from dental issues.  Pt states she has not heard which dentist PCP was referring pt to last month.

## 2020-06-22 NOTE — Telephone Encounter (Signed)
Inbound call from patient requesting a call back from a nurse again please.  Has further questions about the medication.

## 2020-06-26 ENCOUNTER — Encounter: Payer: Self-pay | Admitting: Physical Therapy

## 2020-06-26 ENCOUNTER — Other Ambulatory Visit: Payer: Self-pay

## 2020-06-26 ENCOUNTER — Ambulatory Visit: Payer: Self-pay | Admitting: Physical Therapy

## 2020-06-26 DIAGNOSIS — M545 Low back pain, unspecified: Secondary | ICD-10-CM

## 2020-06-26 DIAGNOSIS — M6281 Muscle weakness (generalized): Secondary | ICD-10-CM

## 2020-06-26 DIAGNOSIS — R262 Difficulty in walking, not elsewhere classified: Secondary | ICD-10-CM

## 2020-06-26 DIAGNOSIS — M79605 Pain in left leg: Secondary | ICD-10-CM

## 2020-06-26 NOTE — Telephone Encounter (Signed)
Unable to reach pt, pt has not called back.

## 2020-06-26 NOTE — Therapy (Signed)
[place], [place], [address] Phone: [phone]   Fax:  [phone]  Physical Therapy Treatment  Patient Details  Name: Nicole Cisneros MRN: [id number] Date of Birth: 11/03/1970 Referring Provider (PT): Pete Pelt, [place]   Encounter Date: 06/26/2020   PT End of Session - 06/26/20 1340    Visit Number 3    Number of Visits 17    Date for PT Re-Evaluation 08/18/20    Authorization Type CAFA 1/19-7/19/22    PT Start Time 0800    PT Stop Time 0842    PT Time Calculation (min) 42 min    Activity Tolerance Patient tolerated treatment well    Behavior During Therapy [facility] for tasks assessed/performed           Past Medical History:  Diagnosis Date  . Anemia   . Constipation   . COPD (chronic obstructive pulmonary disease) ([place])   . Cough   . GERD (gastroesophageal reflux disease)   . Headache(784.0)   . Migraine   . Sore throat   . Weakness   . Weight loss     Past Surgical History:  Procedure Laterality Date  . LAPAROSCOPIC APPENDECTOMY  03/14/2011   Procedure: APPENDECTOMY LAPAROSCOPIC;  Surgeon: Shann Medal, MD;  Location: [place];  Service: General;  Laterality: N/A;  . TUBAL LIGATION      There were no vitals filed for this visit.   Subjective Assessment - 06/26/20 0807    Subjective Patient reports the pain may be a little better. She reports it is going down into her left leg.    Pertinent History see PMH above    Limitations Walking;Standing;Lifting;Sitting;House hold activities    How long can you sit comfortably? not long, about a good 30 minutes    How long can you stand comfortably? not long, about 30 minutes    How long can you walk comfortably? I can't tell you, I have to hold on to something    Diagnostic tests Lumbar spine 2 views: No acute fractures.  No spondylolisthesis.  Disc   space well-maintained.  Normal lordotic curvature.  AP view does show   bilateral hips to  be well located and hip joints appear well-preserved but   they are not completely visualized. Right knee 2 views: Knee is well located.  No acute fractures.  No bony   abnormalities.  No significant arthritic changes are noted all 3 joints   well-preserved. Left knee 2 views: No acute fractures.  Knee is well located.  Knee joint   appears well-preserved.  No significant arthritic changes.  No bony   abnormalities otherwise    Patient Stated Goals I want to be able to be a little better, I want to try to get back to work    Currently in Pain? Yes   but reported the pain was bettter   Pain Score 8     Pain Location Hip    Pain Orientation Right    Pain Descriptors / Indicators Aching    Pain Type Chronic pain    Pain Onset More than a month ago    Pain Frequency Intermittent    Aggravating Factors  walking and standing    Pain Relieving Factors rest and bio freeze    Effect of Pain on Daily Activities reports  [place] Adult PT Treatment/Exercise - 06/26/20 0001      Lumbar Exercises: Stretches   Other Lumbar Stretch Exercise tennis ball trigger point release      Lumbar Exercises: Prone   Other Prone Lumbar Exercises reviewed tehcniuqe with prone press-ups. Pateint di 5 while pressing far up with visable pain . She was educated on perfroming lower press-ups and had improived tolerance. She perfromed 10 at below painful level      Manual Therapy   Manual Therapy Soft tissue mobilization    Manual therapy comments skilled palpation of trigger points    Soft tissue mobilization to lower lumbar spine and gluteal            Trigger Point Dry Needling - 06/26/20 0001    Consent Given? Yes    Education Handout Provided Yes    Muscles Treated Back/Hip Gluteus medius;Lumbar multifidi    Other Dry Needling 1 spot in the lumbar multifidi 2 spots in glut med area    Gluteus Medius Response Twitch response elicited;Palpable increased muscle length     Lumbar multifidi Response Twitch response elicited;Palpable increased muscle length                PT Education - 06/26/20 1340    Education Details HEP and symptom mangement    Person(s) Educated Patient    Methods Explanation;Demonstration;Tactile cues;Verbal cues    Comprehension Verbalized understanding;Returned demonstration;Verbal cues required;Tactile cues required            PT Short Term Goals - 06/18/20 1117      PT SHORT TERM GOAL #1   Title Patient will be independent with initial HEP.    Baseline issued at eval    Time 3    Period Weeks    Status New    Target Date 07/09/20      PT SHORT TERM GOAL #2   Title PT will review FOTO score and predicted functional progress.    Baseline FOTO score captured at eval.    Time 1    Period Weeks    Status New    Target Date 06/25/20      PT SHORT TERM GOAL #3   Title Patient will demonstrate proper heel strike and toe off on LLE and equal step length.    Baseline foot flat initial contact, limited toe off.    Time 4    Period Weeks    Status New    Target Date 07/16/20      PT SHORT TERM GOAL #4   Title Patient will demonstrate resolution of left lateral shift.    Baseline left lateral shift in standing    Time 4    Period Weeks    Status New    Target Date 07/16/20             PT Long Term Goals - 06/18/20 1353      PT LONG TERM GOAL #1   Title Patient will demonstrate 5/5 strength in Lt knee to improve ability to complete squatting and bending activity.    Baseline see flowsheet    Time 8    Period Weeks    Status New    Target Date 08/13/20      PT LONG TERM GOAL #2   Title Patient will demonstrate at least 4+/5 strength in bilateral hips to improve stability about the chain with prolonged walking and standing activity.    Baseline see flowsheet    Time 8  Period Weeks    Status New    Target Date 08/13/20      PT LONG TERM GOAL #3   Title Patient will demonstrate pain free lumbar  AROM to improve tolerance to reaching and bending activity.    Baseline see flowsheet    Time 8    Period Weeks    Status New    Target Date 08/13/20      PT LONG TERM GOAL #4   Title Patient will self-report tolerating at least 10 minutes of walking activity to improve ability to complete work and community activities.    Baseline patient reports unable to walk for any amount of time without holding onto something    Time 8    Period Weeks    Status New    Target Date 08/13/20                 Plan - 06/26/20 0839    Clinical Impression Statement Therapy focused on decreased muscle tightness in lumbar paraspinals and gluteals. She recieved TPDN on in the glutal and 1 spot in the lumbar paraspinals on the left around L3. Therapy rperfroemd manual STM to those areas to reduce post needle soreness. she was given a tennis ball for those areas as well. We reviewed technique with prone pressups. She reports they help but hurt. She is pushing into painful end ranges. Therapy reviewed how to do the press-ups will keeping them below the pain threshhold. She would benfit from further shift correecton and review of exercise.    Personal Factors and Comorbidities Past/Current Experience;Profession;Time since onset of injury/illness/exacerbation;Age    Examination-Activity Limitations Bend;Carry;Lift;Locomotion Level;Sit;Squat;Stairs;Stand    Examination-Participation Restrictions Cleaning;Occupation;Community Activity;Laundry;Shop    Stability/Clinical Decision Making Evolving/Moderate complexity    Clinical Decision Making Moderate    Rehab Potential Good    PT Frequency 2x / week    PT Duration 8 weeks    PT Treatment/Interventions ADLs/Self Care Home Management;Electrical Stimulation;Moist Heat;Traction;Gait training;Stair training;Therapeutic activities;Therapeutic exercise;Balance training;Neuromuscular re-education;Patient/family education;Manual techniques;Passive range of motion;Dry  needling;Taping;Spinal Manipulations    PT Next Visit Plan assess response to repeated extension; did patient get a cane?? continue gait training, implement LE strengthening and add to HEP.    PT Home Exercise Plan Access Code [email or web address]    Consulted and Agree with Plan of Care Patient           Patient will benefit from skilled therapeutic intervention in order to improve the following deficits and impairments:  Abnormal gait,Decreased range of motion,Difficulty walking,Decreased activity tolerance,Pain,Impaired flexibility,Decreased strength,Postural dysfunction  Visit Diagnosis: Chronic bilateral low back pain, unspecified whether sciatica present  Muscle weakness (generalized)  Difficulty in walking, not elsewhere classified  Pain in left leg     Problem List Patient Active Problem List   Diagnosis Date Noted  . Hyperlipidemia 06/07/2020  . Myalgias, multiple 10/23/2014  . Pain in joint, shoulder region 09/14/2014  . Left knee pain 06/21/2014  . GERD (gastroesophageal reflux disease) 03/14/2013  . Anemia 03/14/2013  . Smoking 03/14/2013  . Appendicitis 03/14/2011    Carney Living PT DPT  06/26/2020, 1:45 PM  [facility] [address] [place], [place], [address] Phone: [phone]   Fax:  [phone]  Name: Nicole Cisneros MRN: [id number] Date of Birth: 1971/01/23

## 2020-07-03 ENCOUNTER — Ambulatory Visit: Payer: Self-pay

## 2020-07-03 ENCOUNTER — Other Ambulatory Visit: Payer: Self-pay

## 2020-07-03 DIAGNOSIS — M6281 Muscle weakness (generalized): Secondary | ICD-10-CM

## 2020-07-03 DIAGNOSIS — M79605 Pain in left leg: Secondary | ICD-10-CM

## 2020-07-03 DIAGNOSIS — R262 Difficulty in walking, not elsewhere classified: Secondary | ICD-10-CM

## 2020-07-03 DIAGNOSIS — M545 Low back pain, unspecified: Secondary | ICD-10-CM

## 2020-07-03 NOTE — Patient Instructions (Signed)

## 2020-07-03 NOTE — Therapy (Signed)
Nicole Cisneros, [place], [address] Phone: [phone]   Fax:  [phone]  Physical Therapy Treatment  Patient Details  Name: Nicole Cisneros MRN: [id number] Date of Birth: [birth year] Referring Provider (PT): Nicole Cisneros, [place]   Encounter Date: 07/03/2020   PT End of Session - 07/03/20 1257    Visit Number 4    Number of Visits 17    Date for PT Re-Evaluation 08/18/20    Authorization Type CAFA 1/19-7/19/22    PT Start Time 1240   patient late   PT Stop Time 1331   MHP at end of session   PT Time Calculation (min) 51 min    Activity Tolerance Patient tolerated treatment well    Behavior During Therapy [facility] for tasks assessed/performed           Past Medical History:  Diagnosis Date  . Anemia   . Constipation   . COPD (chronic obstructive pulmonary disease) ([place])   . Cough   . GERD (gastroesophageal reflux disease)   . Headache(784.0)   . Migraine   . Sore throat   . Weakness   . Weight loss     Past Surgical History:  Procedure Laterality Date  . LAPAROSCOPIC APPENDECTOMY  03/14/2011   Procedure: APPENDECTOMY LAPAROSCOPIC;  Surgeon: Nicole Medal, MD;  Location: [place];  Service: General;  Laterality: N/A;  . TUBAL LIGATION      There were no vitals filed for this visit.   Subjective Assessment - 07/03/20 1241    Subjective "I'm hurting today." Patient reports waking with more pain yesterday and states the only thing she can think of is carrying some groceries this weekend. She reports the pain is in her midback and has pulling in this area when she goes to lift her arm. She reports the aching pain in the Lt knee is about the same. She denies any shortness of breath or chest pain.    Pertinent History see PMH above    Limitations Walking;Standing;Lifting;Sitting;House hold activities    How long can you sit comfortably? not long, about a good 30 minutes    How long can you stand  comfortably? not long, about 30 minutes    How long can you walk comfortably? I can't tell you, I have to hold on to something    Diagnostic tests Lumbar spine 2 views: No acute fractures.  No spondylolisthesis.  Disc   space well-maintained.  Normal lordotic curvature.  AP view does show   bilateral hips to be well located and hip joints appear well-preserved but   they are not completely visualized. Right knee 2 views: Knee is well located.  No acute fractures.  No bony   abnormalities.  No significant arthritic changes are noted all 3 joints   well-preserved. Left knee 2 views: No acute fractures.  Knee is well located.  Knee joint   appears well-preserved.  No significant arthritic changes.  No bony   abnormalities otherwise    Patient Stated Goals I want to be able to be a little better, I want to try to get back to work    Currently in Pain? Yes    Pain Score 9     Pain Location Thoracic    Pain Orientation Posterior;Left    Pain Descriptors / Indicators Aching    Pain Type Acute pain    Pain Onset Yesterday    Multiple Pain Sites Yes    Pain Score  7    Pain Location Knee    Pain Orientation Left    Pain Descriptors / Indicators Aching    Pain Type Chronic pain    Pain Onset More than a month ago                             [facility] Adult PT Treatment/Exercise - 07/03/20 0001      Self-Care   Other Self-Care Comments  see patient education      Lumbar Exercises: Stretches   Lower Trunk Rotation 60 seconds      Lumbar Exercises: Seated   Other Seated Lumbar Exercises pelvic tilts 2 x 10      Lumbar Exercises: Sidelying   Other Sidelying Lumbar Exercises open book  1 x 10 each      Modalities   Modalities Moist Heat      Moist Heat Therapy   Number Minutes Moist Heat 10 Minutes    Moist Heat Location Lumbar Spine      Manual Therapy   Soft tissue mobilization STM/DTM Lt thoracolumbar paraspinals                  PT Education - 07/03/20 1322     Education Details Posture/body Office manager education and handout provided. Continue use of tennis ball for self soft tissue mobilization.    Person(s) Educated Patient    Methods Explanation;Demonstration;Verbal cues;Handout    Comprehension Verbalized understanding;Returned demonstration;Need further instruction;Verbal cues required            PT Short Term Goals - 06/18/20 1117      PT SHORT TERM GOAL #1   Title Patient will be independent with initial HEP.    Baseline issued at eval    Time 3    Period Weeks    Status New    Target Date 07/09/20      PT SHORT TERM GOAL #2   Title PT will review FOTO score and predicted functional progress.    Baseline FOTO score captured at eval.    Time 1    Period Weeks    Status New    Target Date 06/25/20      PT SHORT TERM GOAL #3   Title Patient will demonstrate proper heel strike and toe off on LLE and equal step length.    Baseline foot flat initial contact, limited toe off.    Time 4    Period Weeks    Status New    Target Date 07/16/20      PT SHORT TERM GOAL #4   Title Patient will demonstrate resolution of left lateral shift.    Baseline left lateral shift in standing    Time 4    Period Weeks    Status New    Target Date 07/16/20             PT Long Term Goals - 06/18/20 1353      PT LONG TERM GOAL #1   Title Patient will demonstrate 5/5 strength in Lt knee to improve ability to complete squatting and bending activity.    Baseline see flowsheet    Time 8    Period Weeks    Status New    Target Date 08/13/20      PT LONG TERM GOAL #2   Title Patient will demonstrate at least 4+/5 strength in bilateral hips to improve stability about the chain with prolonged walking and standing  activity.    Baseline see flowsheet    Time 8    Period Weeks    Status New    Target Date 08/13/20      PT LONG TERM GOAL #3   Title Patient will demonstrate pain free lumbar AROM to improve tolerance to reaching and bending  activity.    Baseline see flowsheet    Time 8    Period Weeks    Status New    Target Date 08/13/20      PT LONG TERM GOAL #4   Title Patient will self-report tolerating at least 10 minutes of walking activity to improve ability to complete work and community activities.    Baseline patient reports unable to walk for any amount of time without holding onto something    Time 8    Period Weeks    Status New    Target Date 08/13/20                 Plan - 07/03/20 1258    Clinical Impression Statement Patient arrives with increased pain around left midthoracic paraspinals with significant tightness and palpable tenderness present. Moderate improvement in muscle tautness following manual therapy with patient encouraged to continue utilizing tennis ball for self soft tissue mobilization and will likely benefit from further TPDN if tautness remains. Able to complete trunk mobility exercises through partial range without increased pain reported. Time spent educating patient on proper sitting/standing posture and appropriate body mechanics with daily tasks as patient demonstrates significant slump posture in sitting and her flare up in pain could be directly related to improper mechanics with lifting/carrying activity over the weekend. Patient verbalized understanding, though requires further instruction/implementation of appropriate body mechanics with lifting/bending/carrying activity. Minor reduction in pain reported at end of session.    Personal Factors and Comorbidities Past/Current Experience;Profession;Time since onset of injury/illness/exacerbation;Age    Examination-Activity Limitations Bend;Carry;Lift;Locomotion Level;Sit;Squat;Stairs;Stand    Examination-Participation Restrictions Cleaning;Occupation;Community Activity;Laundry;Shop    Stability/Clinical Decision Making Evolving/Moderate complexity    Rehab Potential Good    PT Frequency 2x / week    PT Duration 8 weeks    PT  Treatment/Interventions ADLs/Self Care Home Management;Electrical Stimulation;Moist Heat;Traction;Gait training;Stair training;Therapeutic activities;Therapeutic exercise;Balance training;Neuromuscular re-education;Patient/family education;Manual techniques;Passive range of motion;Dry needling;Taping;Spinal Manipulations    PT Next Visit Plan continue gait training, implement LE strengthening and add to HEP. address body mechanics with functional tasks    PT Home Exercise Plan Access Code [email or web address]    Consulted and Agree with Plan of Care Patient           Patient will benefit from skilled therapeutic intervention in order to improve the following deficits and impairments:  Abnormal gait,Decreased range of motion,Difficulty walking,Decreased activity tolerance,Pain,Impaired flexibility,Decreased strength,Postural dysfunction  Visit Diagnosis: Chronic bilateral low back pain, unspecified whether sciatica present  Muscle weakness (generalized)  Difficulty in walking, not elsewhere classified  Pain in left leg     Problem List Patient Active Problem List   Diagnosis Date Noted  . Hyperlipidemia 06/07/2020  . Myalgias, multiple 10/23/2014  . Pain in joint, shoulder region 09/14/2014  . Left knee pain 06/21/2014  . GERD (gastroesophageal reflux disease) 03/14/2013  . Anemia 03/14/2013  . Smoking 03/14/2013  . Appendicitis 03/14/2011   Gwendolyn Grant, PT, DPT, ATC 07/03/20 1:30 PM  [facility] [address] [place], [place], [address] Phone: [phone]   Fax:  [phone]  Name: LUCELIA LACEY MRN: [id number] Date of Birth: [birth year]

## 2020-07-04 ENCOUNTER — Encounter: Payer: No Typology Code available for payment source | Admitting: Family

## 2020-07-04 NOTE — Progress Notes (Signed)
Patient did not show for appointment.   

## 2020-07-05 ENCOUNTER — Other Ambulatory Visit: Payer: Self-pay

## 2020-07-05 ENCOUNTER — Ambulatory Visit: Payer: Self-pay

## 2020-07-05 DIAGNOSIS — M6281 Muscle weakness (generalized): Secondary | ICD-10-CM

## 2020-07-05 DIAGNOSIS — M545 Low back pain, unspecified: Secondary | ICD-10-CM

## 2020-07-05 DIAGNOSIS — R262 Difficulty in walking, not elsewhere classified: Secondary | ICD-10-CM

## 2020-07-05 DIAGNOSIS — M79605 Pain in left leg: Secondary | ICD-10-CM

## 2020-07-05 DIAGNOSIS — G8929 Other chronic pain: Secondary | ICD-10-CM

## 2020-07-05 NOTE — Therapy (Signed)
Nicole Cisneros, [place], [address] Phone: [phone]   Fax:  [phone]  Physical Therapy Treatment  Patient Details  Name: Nicole Cisneros MRN: [id number] Date of Birth: 1970-10-06 Referring Provider (PT): Pete Pelt, [place]   Encounter Date: 07/05/2020   PT End of Session - 07/05/20 1132    Visit Number 5    Number of Visits 17    Date for PT Re-Evaluation 08/18/20    Authorization Type CAFA 1/19-7/19/22    PT Start Time 1135    PT Stop Time 1220    PT Time Calculation (min) 45 min    Equipment Utilized During Treatment Other (comment)   Lt heel lift, SPC   Activity Tolerance Patient tolerated treatment well    Behavior During Therapy [facility] for tasks assessed/performed           Past Medical History:  Diagnosis Date  . Anemia   . Constipation   . COPD (chronic obstructive pulmonary disease) ([place])   . Cough   . GERD (gastroesophageal reflux disease)   . Headache(784.0)   . Migraine   . Sore throat   . Weakness   . Weight loss     Past Surgical History:  Procedure Laterality Date  . LAPAROSCOPIC APPENDECTOMY  03/14/2011   Procedure: APPENDECTOMY LAPAROSCOPIC;  Surgeon: Shann Medal, MD;  Location: [place];  Service: General;  Laterality: N/A;  . TUBAL LIGATION      There were no vitals filed for this visit.   Subjective Assessment - 07/05/20 1137    Subjective Patient reports more pain in the middle part of her back as opposed to her lower back/leg. She slipped in the bath yesterday and fell on her bottom due to the Lt leg just giving out.    Pertinent History see PMH above    Limitations Walking;Standing;Lifting;Sitting;House hold activities    How long can you sit comfortably? not long, about a good 30 minutes    How long can you stand comfortably? not long, about 30 minutes    How long can you walk comfortably? I can't tell you, I have to hold on to something    Diagnostic tests  Lumbar spine 2 views: No acute fractures.  No spondylolisthesis.  Disc   space well-maintained.  Normal lordotic curvature.  AP view does show   bilateral hips to be well located and hip joints appear well-preserved but   they are not completely visualized. Right knee 2 views: Knee is well located.  No acute fractures.  No bony   abnormalities.  No significant arthritic changes are noted all 3 joints   well-preserved. Left knee 2 views: No acute fractures.  Knee is well located.  Knee joint   appears well-preserved.  No significant arthritic changes.  No bony   abnormalities otherwise    Patient Stated Goals I want to be able to be a little better, I want to try to get back to work    Currently in Pain? Yes    Pain Score 9     Pain Location Thoracic    Pain Orientation Mid    Pain Descriptors / Indicators --   hurting   Pain Type Acute pain    Pain Onset Yesterday    Pain Frequency Constant    Multiple Pain Sites Yes    Pain Score 8    Pain Location Knee    Pain Orientation Left    Pain Descriptors /  Indicators Aching    Pain Type Chronic pain    Pain Onset More than a month ago    Pain Frequency Constant              [facility] PT Assessment - 07/05/20 0001      Palpation   Palpation comment symmetrical pelvic alignment      Special Tests    Special Tests --    Other special tests Leg length: 34 inch L ASIS to medial malleoli 35 inch Rt ASIS to medial malleoli                         [facility] Adult PT Treatment/Exercise - 07/05/20 0001      Ambulation/Gait   Ambulation/Gait Yes    Ambulation/Gait Assistance 6: Modified independent (Device/Increase time)    Ambulation Distance (Feet) 185 Feet    Stairs Yes    Stairs Assistance 6: Modified independent (Device/Increase time)    Height of Stairs --   4 inch ascent; 6 inch descent   Gait Comments Lt heel lift; focus on step through gait pattern and appropriate seequencing with cane      Self-Care   Other Self-Care  Comments  see patient education      Lumbar Exercises: Supine   Clam 10 reps    Bridge 10 reps      Manual Therapy   Soft tissue mobilization STM/DTM Lt thoracolumbar paraspinals                  PT Education - 07/05/20 1217    Education Details Issued Lt heel lift with recommendation to increase wear time daily to build up tolerance. Appropriate fitting of SPC. Updated HEP.    Person(s) Educated Patient    Methods Explanation;Demonstration;Verbal cues;Handout    Comprehension Verbalized understanding;Returned demonstration;Verbal cues required            PT Short Term Goals - 06/18/20 1117      PT SHORT TERM GOAL #1   Title Patient will be independent with initial HEP.    Baseline issued at eval    Time 3    Period Weeks    Status New    Target Date 07/09/20      PT SHORT TERM GOAL #2   Title PT will review FOTO score and predicted functional progress.    Baseline FOTO score captured at eval.    Time 1    Period Weeks    Status New    Target Date 06/25/20      PT SHORT TERM GOAL #3   Title Patient will demonstrate proper heel strike and toe off on LLE and equal step length.    Baseline foot flat initial contact, limited toe off.    Time 4    Period Weeks    Status New    Target Date 07/16/20      PT SHORT TERM GOAL #4   Title Patient will demonstrate resolution of left lateral shift.    Baseline left lateral shift in standing    Time 4    Period Weeks    Status New    Target Date 07/16/20             PT Long Term Goals - 06/18/20 1353      PT LONG TERM GOAL #1   Title Patient will demonstrate 5/5 strength in Lt knee to improve ability to complete squatting and bending activity.  Baseline see flowsheet    Time 8    Period Weeks    Status New    Target Date 08/13/20      PT LONG TERM GOAL #2   Title Patient will demonstrate at least 4+/5 strength in bilateral hips to improve stability about the chain with prolonged walking and standing  activity.    Baseline see flowsheet    Time 8    Period Weeks    Status New    Target Date 08/13/20      PT LONG TERM GOAL #3   Title Patient will demonstrate pain free lumbar AROM to improve tolerance to reaching and bending activity.    Baseline see flowsheet    Time 8    Period Weeks    Status New    Target Date 08/13/20      PT LONG TERM GOAL #4   Title Patient will self-report tolerating at least 10 minutes of walking activity to improve ability to complete work and community activities.    Baseline patient reports unable to walk for any amount of time without holding onto something    Time 8    Period Weeks    Status New    Target Date 08/13/20                 Plan - 07/05/20 1212    Clinical Impression Statement Though patient reports hight pain levels upon arrival she tolerated session well today. Patient arrives with [facility] that was properly fitted and time spent gait training and completing stair negotiation with SPC. Patient initially requires cues for proper sequencing with SPC and allowing for step through gait pattern and with continued practice she is able to sequence appropriately and demo's step through pattern, though gait speed remains slow. Patient noted to have LLD upon assessment (LLE shorter when measured from ASIS to medial malleoli), so heel lift was issued today. Patient did report a slight reduction in LLE pain when walking with the heel lift and utilizing SPC. Tautness and palpable tenderness remains about Lt thoracic paraspinals and patient would potentially benefit from further TPDN at future sessions if tautness remains. Able to introduce hip strengthening and added to home program with patient quickly fatiguing with exercises.    Personal Factors and Comorbidities Past/Current Experience;Profession;Time since onset of injury/illness/exacerbation;Age    Examination-Activity Limitations Bend;Carry;Lift;Locomotion Level;Sit;Squat;Stairs;Stand     Examination-Participation Restrictions Cleaning;Occupation;Community Activity;Laundry;Shop    Stability/Clinical Decision Making Evolving/Moderate complexity    Rehab Potential Good    PT Frequency 2x / week    PT Duration 8 weeks    PT Treatment/Interventions ADLs/Self Care Home Management;Electrical Stimulation;Moist Heat;Traction;Gait training;Stair training;Therapeutic activities;Therapeutic exercise;Balance training;Neuromuscular re-education;Patient/family education;Manual techniques;Passive range of motion;Dry needling;Taping;Spinal Manipulations    PT Next Visit Plan capture FOTO, tolerance to heel lift??? potential TPDN to Lt paraspinals. continue gait training, progress LE/core strengthening and add to HEP prn. address body mechanics with functional tasks    PT Home Exercise Plan Access Code [email or web address]    Consulted and Agree with Plan of Care Patient           Patient will benefit from skilled therapeutic intervention in order to improve the following deficits and impairments:  Abnormal gait,Decreased range of motion,Difficulty walking,Decreased activity tolerance,Pain,Impaired flexibility,Decreased strength,Postural dysfunction  Visit Diagnosis: Chronic bilateral low back pain, unspecified whether sciatica present  Muscle weakness (generalized)  Difficulty in walking, not elsewhere classified  Pain in left leg     Problem List Patient Active Problem List  Diagnosis Date Noted  . Hyperlipidemia 06/07/2020  . Myalgias, multiple 10/23/2014  . Pain in joint, shoulder region 09/14/2014  . Left knee pain 06/21/2014  . GERD (gastroesophageal reflux disease) 03/14/2013  . Anemia 03/14/2013  . Smoking 03/14/2013  . Appendicitis 03/14/2011   Gwendolyn Grant, PT, DPT, ATC 07/05/20 1:38 PM  [facility] [address] [place], [place], [address] Phone: [phone]   Fax:  [phone]  Name: Nicole Cisneros MRN:  [id number] Date of Birth: 10/09/1970

## 2020-07-10 ENCOUNTER — Ambulatory Visit: Payer: Self-pay | Admitting: Physical Therapy

## 2020-07-12 ENCOUNTER — Other Ambulatory Visit: Payer: Self-pay

## 2020-07-12 ENCOUNTER — Ambulatory Visit: Payer: Self-pay

## 2020-07-12 DIAGNOSIS — M545 Low back pain, unspecified: Secondary | ICD-10-CM

## 2020-07-12 DIAGNOSIS — R262 Difficulty in walking, not elsewhere classified: Secondary | ICD-10-CM

## 2020-07-12 DIAGNOSIS — M79605 Pain in left leg: Secondary | ICD-10-CM

## 2020-07-12 DIAGNOSIS — M6281 Muscle weakness (generalized): Secondary | ICD-10-CM

## 2020-07-12 NOTE — Therapy (Addendum)
[place], [place], [address] Phone: [phone]   Fax:  [phone]  Physical Therapy Treatment/Discharge  Patient Details  Name: Nicole Cisneros MRN: [id number] Date of Birth: Oct 15, 1970 Referring Provider (PT): Pete Pelt, [place]   Encounter Date: 07/12/2020   PT End of Session - 07/12/20 1347    Visit Number 6    Number of Visits 17    Date for PT Re-Evaluation 08/18/20    Authorization Type CAFA 1/19-7/19/22    PT Start Time 1338   patient late   PT Stop Time 1400    PT Time Calculation (min) 22 min    Equipment Utilized During Treatment Other (comment)   SPC   Activity Tolerance Patient limited by pain    Behavior During Therapy [facility] for tasks assessed/performed           Past Medical History:  Diagnosis Date  . Anemia   . Constipation   . COPD (chronic obstructive pulmonary disease) ([place])   . Cough   . GERD (gastroesophageal reflux disease)   . Headache(784.0)   . Migraine   . Sore throat   . Weakness   . Weight loss     Past Surgical History:  Procedure Laterality Date  . LAPAROSCOPIC APPENDECTOMY  03/14/2011   Procedure: APPENDECTOMY LAPAROSCOPIC;  Surgeon: Shann Medal, MD;  Location: [place];  Service: General;  Laterality: N/A;  . TUBAL LIGATION      There were no vitals filed for this visit.   Subjective Assessment - 07/12/20 1343    Subjective "This weather has me feeling nasty." Patient reports aching pain in both legs. She reports having itching and redness in the area of where she was needled starting on Saturday, but she hasn't received TPDN since 06/26/20.    Pertinent History see PMH above    Limitations Walking;Standing;Lifting;Sitting;House hold activities    How long can you sit comfortably? not long, about a good 30 minutes    How long can you stand comfortably? not long, about 30 minutes    How long can you walk comfortably? I can't tell you, I have to hold  on to something    Diagnostic tests Lumbar spine 2 views: No acute fractures.  No spondylolisthesis.  Disc   space well-maintained.  Normal lordotic curvature.  AP view does show   bilateral hips to be well located and hip joints appear well-preserved but   they are not completely visualized. Right knee 2 views: Knee is well located.  No acute fractures.  No bony   abnormalities.  No significant arthritic changes are noted all 3 joints   well-preserved. Left knee 2 views: No acute fractures.  Knee is well located.  Knee joint   appears well-preserved.  No significant arthritic changes.  No bony   abnormalities otherwise    Patient Stated Goals I want to be able to be a little better, I want to try to get back to work    Currently in Pain? Yes    Pain Score 10-Worst pain ever    Pain Location Leg    Pain Orientation Left;Right    Pain Descriptors / Indicators Aching    Pain Type Chronic pain    Pain Onset --    Pain Onset More than a month ago              [facility] PT Assessment - 07/12/20 0001      Observation/Other Assessments  Observations multiple red, raised bumps along lumbar region    Focus on Therapeutic Outcomes (FOTO)  22% lumbar 6% hip                         [facility] Adult PT Treatment/Exercise - 07/12/20 0001      Lumbar Exercises: Aerobic   Nustep 5 minutes, level 5 UE/LE      Knee/Hip Exercises: Standing   Other Standing Knee Exercises standing march attempted pain                  PT Education - 07/12/20 1356    Education Details Follow-up with PCP for red,raised bumps located along lumbar region. FOTO results. Recommendation to f/u with referring provider due to ongoing pain.    Person(s) Educated Patient    Methods Explanation    Comprehension Verbalized understanding            PT Short Term Goals - 07/12/20 1355      PT SHORT TERM GOAL #1   Title Patient will be independent with initial HEP.    Time 3    Period Weeks    Status  Achieved    Target Date 07/09/20      PT SHORT TERM GOAL #2   Title PT will review FOTO score and predicted functional progress.    Time 1    Period Weeks    Status Achieved    Target Date 06/25/20      PT SHORT TERM GOAL #3   Title Patient will demonstrate proper heel strike and toe off on LLE and equal step length.    Baseline able to demo with [facility]    Time 4    Period Weeks    Status Achieved    Target Date 07/16/20      PT SHORT TERM GOAL #4   Title Patient will demonstrate resolution of left lateral shift.    Baseline left lateral shift in standing    Time 4    Period Weeks    Status On-going    Target Date 07/16/20             PT Long Term Goals - 06/18/20 1353      PT LONG TERM GOAL #1   Title Patient will demonstrate 5/5 strength in Lt knee to improve ability to complete squatting and bending activity.    Baseline see flowsheet    Time 8    Period Weeks    Status New    Target Date 08/13/20      PT LONG TERM GOAL #2   Title Patient will demonstrate at least 4+/5 strength in bilateral hips to improve stability about the chain with prolonged walking and standing activity.    Baseline see flowsheet    Time 8    Period Weeks    Status New    Target Date 08/13/20      PT LONG TERM GOAL #3   Title Patient will demonstrate pain free lumbar AROM to improve tolerance to reaching and bending activity.    Baseline see flowsheet    Time 8    Period Weeks    Status New    Target Date 08/13/20      PT LONG TERM GOAL #4   Title Patient will self-report tolerating at least 10 minutes of walking activity to improve ability to complete work and community activities.    Baseline patient reports unable to walk for  any amount of time without holding onto something    Time 8    Period Weeks    Status New    Target Date 08/13/20                 Plan - 07/12/20 1358    Clinical Impression Statement Patient's overall session was limited secondary to high pain  levels. FOTO score shows regression in subjective functional progress compared to score at evaluation. Her pain levels have remained high since the start of care and have fluctuated throughout multiple body parts (lumbar, hips, knees, feet, cervical, thoracic region). Her gait has improved significantly with addition of SPC, though this has not made any impact on her pain with ambulation. Attempted standing activity, though patient unable to tolerate due to pain. Given her ongoing pain and reports of no subjective improvement since start of care it was recommended that PT be placed on hold unti patient is able to f/u with referring provider. It is also recommended that she f/u with PCP regarding red,raised bumps along her lumbar region that have been present for 5 days. Patient is in agreement with this plan.    PT Treatment/Interventions ADLs/Self Care Home Management;Electrical Stimulation;Moist Heat;Traction;Gait training;Stair training;Therapeutic activities;Therapeutic exercise;Balance training;Neuromuscular re-education;Patient/family education;Manual techniques;Passive range of motion;Dry needling;Taping;Spinal Manipulations    PT Home Exercise Plan Access Code [email or web address]    Recommended Other Services f/u with referring provider for ongoing pain; f/u with PCP for skin irritation    Consulted and Agree with Plan of Care Patient           Patient will benefit from skilled therapeutic intervention in order to improve the following deficits and impairments:  Abnormal gait,Decreased range of motion,Difficulty walking,Decreased activity tolerance,Pain,Impaired flexibility,Decreased strength,Postural dysfunction  Visit Diagnosis: Chronic bilateral low back pain, unspecified whether sciatica present  Muscle weakness (generalized)  Difficulty in walking, not elsewhere classified  Pain in left leg     Problem List Patient Active Problem List   Diagnosis Date Noted  . Hyperlipidemia 06/07/2020   . Myalgias, multiple 10/23/2014  . Pain in joint, shoulder region 09/14/2014  . Left knee pain 06/21/2014  . GERD (gastroesophageal reflux disease) 03/14/2013  . Anemia 03/14/2013  . Smoking 03/14/2013  . Appendicitis 03/14/2011   Gwendolyn Grant, PT, DPT, ATC 07/12/20 2:29 PM PHYSICAL THERAPY DISCHARGE SUMMARY  Visits from Start of Care: 6  Current functional level related to goals / functional outcomes: See above   Remaining deficits: Current status unknown   Education / Equipment: See above   Plan: Patient agrees to discharge.  Patient goals were partially met. Patient is being discharged due to not returning since the last visit.  ?????        Gwendolyn Grant, PT, DPT, ATC 08/22/20 1:01 PM   [place] [facility] [address] [place], [place], [address] Phone: [phone]   Fax:  [phone]  Name: Nicole Cisneros MRN: [id number] Date of Birth: 1970-06-01

## 2020-07-17 ENCOUNTER — Encounter: Payer: No Typology Code available for payment source | Admitting: Physical Therapy

## 2020-07-18 ENCOUNTER — Encounter: Payer: Self-pay | Admitting: Physician Assistant

## 2020-07-18 ENCOUNTER — Other Ambulatory Visit: Payer: Self-pay

## 2020-07-18 ENCOUNTER — Encounter: Payer: No Typology Code available for payment source | Admitting: Family

## 2020-07-18 ENCOUNTER — Ambulatory Visit (INDEPENDENT_AMBULATORY_CARE_PROVIDER_SITE_OTHER): Payer: No Typology Code available for payment source | Admitting: Physician Assistant

## 2020-07-18 DIAGNOSIS — M7062 Trochanteric bursitis, left hip: Secondary | ICD-10-CM

## 2020-07-18 DIAGNOSIS — M5416 Radiculopathy, lumbar region: Secondary | ICD-10-CM

## 2020-07-18 MED ORDER — DIAZEPAM 5 MG PO TABS: ORAL_TABLET | ORAL | 0 refills | Status: DC

## 2020-07-18 MED FILL — Atorvastatin Calcium Tab 20 MG (Base Equivalent): ORAL | 30 days supply | Qty: 30 | Fill #0 | Status: AC

## 2020-07-18 NOTE — Progress Notes (Signed)
Patient did not show for appointment.   

## 2020-07-18 NOTE — Progress Notes (Signed)
Office Visit Note   Patient: Nicole Cisneros           Date of Birth: 1970-06-09           MRN: [id number] Visit Date: 07/18/2020              Requested by: Camillia Herter, NP [address] [place],  [place] [address] PCP: Camillia Herter, NP   Assessment & Plan: Visit Diagnoses:  1. Trochanteric bursitis, left hip   2. Lumbar radicular pain     Plan: Due to the fact the patient is failed conservative treatment which included physical therapy time medications and her condition was becoming worse recommend MRI of the lumbar spine to rule out HNP as a source of her radicular symptoms down both legs.  Have her follow-up after the MRI to go over results and discuss further treatment.  Discussed with her that on my exam there likely is her ankle and would recommend removing the left from her left shoe.  Patient is claustrophobic  Follow-Up Instructions: Return After MRI.   Orders:  No orders of the defined types were placed in this encounter.  Meds ordered this encounter  Medications  . DISCONTD: diazepam (VALIUM) 5 MG tablet    Sig: TAKE ONE TAB ONE HOUR PRIOR TO MRI REPEAT AS NEEDED #2 . ZERO REFILLS    Dispense:  2 tablet    Refill:  0  . diazepam (VALIUM) 5 MG tablet    Sig: TAKE ONE TAB ONE HOUR PRIOR TO MRI REPEAT AS NEEDED #2 . ZERO REFILLS    Dispense:  2 tablet    Refill:  0      Procedures: No procedures performed   Clinical Data: No additional findings.   Subjective: Chief Complaint  Patient presents with  . Lower Back - Pain  . Left Leg - Pain  . Right Leg - Pain    HPI Patient returns today with increasing low back pain despite conservative treatment which include physical therapy.  She is having pain that radiates down the anterior aspects of both lower legs.  There is now having some numbness tingling.  She is using a cane to ambulate.  She denies any saddle anesthesia like symptoms.  She does note some bladder incontinence which is new for  her. Review of Systems Denies any fevers or chills.  Objective: Vital Signs: There were no vitals taken for this visit.  Physical Exam Constitutional:      Appearance: She is not ill-appearing or diaphoretic.  Pulmonary:     Effort: Pulmonary effort is normal.  Neurological:     Mental Status: She is alert and oriented to person, place, and time.  Psychiatric:        Mood and Affect: Mood normal.     Ortho Exam Lower extremities she has slight weakness with flexion of the left hip against resistance.  Also dorsiflexion of the left foot is 4 out of 5 compared to 5 out of 5 on the right.  Otherwise 5/5 strength throughout lower extremities.  Subjective decreased sensation over the deep peroneal nerve region left greater than right.  Straight leg raise is positive on the left.  Good range of motion both hips. Specialty Comments:  No specialty comments available.  Imaging: No results found.   PMFS History: Patient Active Problem List   Diagnosis Date Noted  . Hyperlipidemia 06/07/2020  . Myalgias, multiple 10/23/2014  . Pain in joint, shoulder region 09/14/2014  .  Left knee pain 06/21/2014  . GERD (gastroesophageal reflux disease) 03/14/2013  . Anemia 03/14/2013  . Smoking 03/14/2013  . Appendicitis 03/14/2011   Past Medical History:  Diagnosis Date  . Anemia   . Constipation   . COPD (chronic obstructive pulmonary disease) ([place])   . Cough   . GERD (gastroesophageal reflux disease)   . Headache(784.0)   . Migraine   . Sore throat   . Weakness   . Weight loss     Family History  Problem Relation Age of Onset  . Cancer Father        bone  . Cancer Sister        breast  . Heart disease Mother   . Hypertension Mother   . Asthma Other   . Hyperlipidemia Other     Past Surgical History:  Procedure Laterality Date  . LAPAROSCOPIC APPENDECTOMY  03/14/2011   Procedure: APPENDECTOMY LAPAROSCOPIC;  Surgeon: Shann Medal, MD;  Location: [place];  Service: General;   Laterality: N/A;  . TUBAL LIGATION     Social History   Occupational History  . Occupation: housekeeping  Tobacco Use  . Smoking status: Current Every Day Smoker    Packs/day: 1.00    Years: 12.00    Pack years: 12.00    Types: Cigarettes  . Smokeless tobacco: Never Used  . Tobacco comment: Smoking 1 ppd  Vaping Use  . Vaping Use: Never used  Substance and Sexual Activity  . Alcohol use: No    Alcohol/week: 0.0 standard drinks  . Drug use: No  . Sexual activity: Not on file

## 2020-07-19 ENCOUNTER — Ambulatory Visit: Payer: No Typology Code available for payment source

## 2020-07-19 ENCOUNTER — Other Ambulatory Visit: Payer: Self-pay | Admitting: Family

## 2020-07-19 DIAGNOSIS — Z1231 Encounter for screening mammogram for malignant neoplasm of breast: Secondary | ICD-10-CM

## 2020-07-19 NOTE — Addendum Note (Signed)
Addended by: Robyne Peers on: 07/19/2020 08:15 AM   Modules accepted: Orders

## 2020-07-30 ENCOUNTER — Inpatient Hospital Stay: Admission: RE | Admit: 2020-07-30 | Payer: No Typology Code available for payment source | Source: Ambulatory Visit

## 2020-07-30 ENCOUNTER — Other Ambulatory Visit: Payer: Self-pay | Admitting: Family

## 2020-07-30 ENCOUNTER — Other Ambulatory Visit: Payer: Self-pay

## 2020-07-30 DIAGNOSIS — M25562 Pain in left knee: Secondary | ICD-10-CM

## 2020-07-30 DIAGNOSIS — G8929 Other chronic pain: Secondary | ICD-10-CM

## 2020-07-30 MED ORDER — CYCLOBENZAPRINE HCL 5 MG PO TABS
5.0000 mg | ORAL_TABLET | Freq: Every day | ORAL | 0 refills | Status: AC
Start: 2020-07-30 — End: 2020-09-10
  Filled 2020-07-30 – 2020-08-20 (×2): qty 21, 21d supply, fill #0

## 2020-07-30 MED FILL — Pantoprazole Sodium EC Tab 40 MG (Base Equiv): ORAL | 30 days supply | Qty: 60 | Fill #0 | Status: CN

## 2020-07-30 NOTE — Telephone Encounter (Signed)
Cyclobenzaprine refilled per patient request.

## 2020-07-30 NOTE — Progress Notes (Signed)
Patient ID: Nicole Cisneros, female    DOB: 1970/05/05  MRN: [id number]  CC: Hot Flashes Follow-Up  Subjective: Nicole Cisneros is a 50 y.o. female who presents for hot flashes follow-up. Her concerns today include:   1. HOT FLASHES FOLLOW-UP: 06/06/2020: - Gabapentin as prescribed for hot flashes.  - Follow-up with primary provider in 4 weeks or sooner if needed.   07/31/2020: Gabapentin not helping. Would like to try new medication.   2. BODY PAIN: Back pain, hip pain, knee pain, and neck pain. Reports discharged from PT because of pain worsening. Currently followed by Orthopedics and has follow-up appointment soon.  Patient Active Problem List   Diagnosis Date Noted  . Hyperlipidemia 06/07/2020  . Myalgias, multiple 10/23/2014  . Pain in joint, shoulder region 09/14/2014  . Left knee pain 06/21/2014  . GERD (gastroesophageal reflux disease) 03/14/2013  . Anemia 03/14/2013  . Smoking 03/14/2013  . Appendicitis 03/14/2011     Current Outpatient Medications on File Prior to Visit  Medication Sig Dispense Refill  . atorvastatin (LIPITOR) 20 MG tablet TAKE 1 TABLET (20 MG TOTAL) BY MOUTH DAILY. 90 tablet 0  . bismuth-metronidazole-tetracycline (PYLERA) 140-125-125 MG capsule TAKE 3 CAPSULES BY MOUTH 4 (FOUR) TIMES DAILY - BEFORE MEALS AND AT BEDTIME. 120 capsule 0  . cyclobenzaprine (FLEXERIL) 5 MG tablet Take 1 tablet (5 mg total) by mouth daily for 21 days. 21 tablet 0  . diazepam (VALIUM) 5 MG tablet TAKE ONE TAB ONE HOUR PRIOR TO MRI REPEAT AS NEEDED #2 . ZERO REFILLS 2 tablet 0  . pantoprazole (PROTONIX) 40 MG tablet TAKE 1 TABLET (40 MG TOTAL) BY MOUTH 2 (TWO) TIMES DAILY. 20 tablet 0  . tetracycline (SUMYCIN) 500 MG capsule TAKE 1 CAPSULE (500 MG TOTAL) BY MOUTH 4 (FOUR) TIMES DAILY. 56 capsule 0  . [DISCONTINUED] fluticasone (FLONASE) 50 MCG/ACT nasal spray Place 2 sprays into both nostrils daily. FOR NASAL CONGESTION 16 g 0  . [DISCONTINUED] potassium chloride SA  (K-DUR,KLOR-CON) 20 MEQ tablet Take 2 tablets (40 mEq total) by mouth 2 (two) times daily for 7 days. 28 tablet 0   No current facility-administered medications on file prior to visit.    Allergies  Allergen Reactions  . Amoxicillin Nausea And Vomiting  . Erythromycin Nausea And Vomiting  . Percocet [Oxycodone-Acetaminophen] Nausea And Vomiting  . Vicodin [Hydrocodone-Acetaminophen] Nausea And Vomiting    Social History   Socioeconomic History  . Marital status: Single    Spouse name: Not on file  . Number of children: 2  . Years of education: Not on file  . Highest education level: Not on file  Occupational History  . Occupation: housekeeping  Tobacco Use  . Smoking status: Current Every Day Smoker    Packs/day: 1.00    Years: 12.00    Pack years: 12.00    Types: Cigarettes  . Smokeless tobacco: Never Used  . Tobacco comment: Smoking 1 ppd  Vaping Use  . Vaping Use: Never used  Substance and Sexual Activity  . Alcohol use: No    Alcohol/week: 0.0 standard drinks  . Drug use: No  . Sexual activity: Not on file  Other Topics Concern  . Not on file  Social History Narrative  . Not on file   Social Determinants of Health   Financial Resource Strain: Not on file  Food Insecurity: No Food Insecurity  . Worried About Charity fundraiser in the Last Year: Never true  . Ran Out of  Food in the Last Year: Never true  Transportation Needs: Not on file  Physical Activity: Not on file  Stress: Not on file  Social Connections: Not on file  Intimate Partner Violence: Not on file    Family History  Problem Relation Age of Onset  . Cancer Father        bone  . Cancer Sister        breast  . Heart disease Mother   . Hypertension Mother   . Asthma Other   . Hyperlipidemia Other     Past Surgical History:  Procedure Laterality Date  . LAPAROSCOPIC APPENDECTOMY  03/14/2011   Procedure: APPENDECTOMY LAPAROSCOPIC;  Surgeon: Shann Medal, MD;  Location: [place];   Service: General;  Laterality: N/A;  . TUBAL LIGATION      ROS: Review of Systems Negative except as stated above  PHYSICAL EXAM: BP 119/84 (BP Location: Left Arm, Patient Position: Sitting)   Pulse 66   Ht 5' 4.37" (1.635 m)   Wt 164 lb 6.4 oz (74.6 kg)   SpO2 98%   BMI 27.90 kg/m   Physical Exam HENT:     Head: Normocephalic and atraumatic.  Eyes:     Extraocular Movements: Extraocular movements intact.     Conjunctiva/sclera: Conjunctivae normal.     Pupils: Pupils are equal, round, and reactive to light.  Cardiovascular:     Rate and Rhythm: Normal rate and regular rhythm.     Pulses: Normal pulses.     Heart sounds: Normal heart sounds.  Pulmonary:     Effort: Pulmonary effort is normal.     Breath sounds: Normal breath sounds.  Musculoskeletal:     Cervical back: Neck supple.  Neurological:     General: No focal deficit present.     Mental Status: She is alert and oriented to person, place, and time.  Psychiatric:        Mood and Affect: Mood normal.        Behavior: Behavior normal.    ASSESSMENT AND PLAN: 1. Hot flashes due to menopause: - Discontinue Gabapentin.  - Paroxetine as prescribed. Follow-up with primary provider in 4 weeks or sooner if needed.   Avoid driving or hazardous activity until you know how this medication will affect you. Your reactions could be impaired. Dizziness or fainting can cause falls, accidents, or severe injuries.  Common side effects include drowsiness, nausea, constipation, loss of appetite, dry mouth, increased sweating.  Call your provider if you have pounding heartbeats or fluttering in your chest, a light-headed feeling like you may pass out, easy bruising/unusal bleeding, vision change, difficult or painful urination, impotence/sexual problems, liver problems (right-sided upper stomach pain, itching, dark urine, yellowing of skin or eyes/jaundice, low levels of sodium in the body (headache, confusion, slurred speech,  severe weakness, vomiting, loss of coordination, feeling unsteady), or manic episodes (racing thoughts, increased energy, decreased need for sleep, risk-taking behavior, being agitated, talkative)  Seek medical attention immediately if you have symptoms of serotonin syndrome such as agitation, hallucinations, fever, sweating, shivering, fast heart rate, muscle stiffness, twitching, loss of coordination, nausea, vomiting, or diarrhea  Report any new or worsening symptoms to your provider, such as but not limited to: mood or behavior changes, anxiety, panic attacks, trouble sleeping, or if you feel impulsive, irritable, agitated, hostile, aggressive, restless, hyperactive (mentally or physically), more depressed, or have thoughts about suicide or hurting yourself - PARoxetine (PAXIL) 10 MG tablet; Take 1 tablet (10 mg total) by mouth  daily.  Dispense: 30 tablet; Refill: 0  2. Complaints of total body pain: - Chronic.  - Keep all appointments with Orthopedics.  - Referral to Pain Clinic for further evaluation and management.  - Ambulatory referral to Pain Clinic  Patient was given the opportunity to ask questions.  Patient verbalized understanding of the plan and was able to repeat key elements of the plan. Patient was given clear instructions to go to Emergency Department or return to medical center if symptoms don't improve, worsen, or new problems develop.The patient verbalized understanding.   Orders Placed This Encounter  Procedures  . Ambulatory referral to Pain Clinic   Requested Prescriptions   Signed Prescriptions Disp Refills  . PARoxetine (PAXIL) 10 MG tablet 30 tablet 0    Sig: Take 1 tablet (10 mg total) by mouth daily.    Follow-up with primary provider in 4 weeks or sooner if needed.   Camillia Herter, NP

## 2020-07-31 ENCOUNTER — Other Ambulatory Visit: Payer: Self-pay

## 2020-07-31 ENCOUNTER — Encounter: Payer: Self-pay | Admitting: Family

## 2020-07-31 ENCOUNTER — Telehealth: Payer: Self-pay

## 2020-07-31 ENCOUNTER — Ambulatory Visit (INDEPENDENT_AMBULATORY_CARE_PROVIDER_SITE_OTHER): Payer: No Typology Code available for payment source | Admitting: Family

## 2020-07-31 ENCOUNTER — Encounter: Payer: No Typology Code available for payment source | Admitting: Physical Therapy

## 2020-07-31 VITALS — BP 119/84 | HR 66 | Ht 64.37 in | Wt 164.4 lb

## 2020-07-31 DIAGNOSIS — N951 Menopausal and female climacteric states: Secondary | ICD-10-CM

## 2020-07-31 DIAGNOSIS — R52 Pain, unspecified: Secondary | ICD-10-CM

## 2020-07-31 MED ORDER — PAROXETINE HCL 10 MG PO TABS
10.0000 mg | ORAL_TABLET | Freq: Every day | ORAL | 0 refills | Status: DC
Start: 2020-07-31 — End: 2020-11-09
  Filled 2020-07-31 – 2020-08-20 (×2): qty 30, 30d supply, fill #0

## 2020-07-31 NOTE — Progress Notes (Signed)
Hot flashes Generalized leg pain appt Thursday for MRI

## 2020-07-31 NOTE — Telephone Encounter (Signed)
Patient left message requesting return call, regarding needing assistance with Nicole Cisneros. Attempted to contact patient, received message, "voicemail box not set up".

## 2020-08-01 ENCOUNTER — Telehealth: Payer: Self-pay

## 2020-08-01 ENCOUNTER — Other Ambulatory Visit: Payer: Self-pay

## 2020-08-01 NOTE — Telephone Encounter (Signed)
Attempted to contact patient and patient's daughter though unable to leave voicemail to notify patient of cancelling tomorrow's scheduled PT visit as she is still awaiting imaging and f/u with physician. PT was originally placed on hold on 07/12/20 pending f/u with physician.

## 2020-08-02 ENCOUNTER — Ambulatory Visit
Admission: RE | Admit: 2020-08-02 | Discharge: 2020-08-02 | Disposition: A | Discharge status: Home / Self Care | Payer: No Typology Code available for payment source | Source: Ambulatory Visit | Attending: Physician Assistant | Admitting: Physician Assistant

## 2020-08-02 ENCOUNTER — Other Ambulatory Visit: Payer: Self-pay

## 2020-08-02 ENCOUNTER — Ambulatory Visit: Payer: Self-pay

## 2020-08-02 DIAGNOSIS — M5416 Radiculopathy, lumbar region: Secondary | ICD-10-CM

## 2020-08-03 ENCOUNTER — Telehealth: Payer: Self-pay

## 2020-08-03 NOTE — Telephone Encounter (Signed)
Radiology called with call report on her Lumbar MRI; it is in the chart now

## 2020-08-06 ENCOUNTER — Telehealth: Payer: Self-pay

## 2020-08-06 ENCOUNTER — Encounter: Payer: Self-pay | Admitting: Physical Medicine and Rehabilitation

## 2020-08-06 ENCOUNTER — Other Ambulatory Visit: Payer: Self-pay

## 2020-08-06 NOTE — Telephone Encounter (Signed)
Attempted to contact patient  though unable to leave voicemail to notify patient of cancelling tomorrow's scheduled PT visit as she is still awaiting f/u with physician. PT was originally placed on hold on 07/12/20 pending f/u with physician.

## 2020-08-07 ENCOUNTER — Ambulatory Visit: Payer: Self-pay

## 2020-08-07 ENCOUNTER — Ambulatory Visit (INDEPENDENT_AMBULATORY_CARE_PROVIDER_SITE_OTHER): Payer: Self-pay | Admitting: Orthopaedic Surgery

## 2020-08-07 ENCOUNTER — Other Ambulatory Visit: Payer: Self-pay

## 2020-08-07 ENCOUNTER — Encounter: Payer: Self-pay | Admitting: Orthopaedic Surgery

## 2020-08-07 DIAGNOSIS — M533 Sacrococcygeal disorders, not elsewhere classified: Secondary | ICD-10-CM

## 2020-08-07 DIAGNOSIS — M5416 Radiculopathy, lumbar region: Secondary | ICD-10-CM

## 2020-08-07 NOTE — Progress Notes (Signed)
Office Visit Note   Patient: Nicole Cisneros           Date of Birth: May 13, 1970           MRN: [id number] Visit Date: 08/07/2020              Requested by: Camillia Herter, NP [address] [place],  [place] [address] PCP: Camillia Herter, NP   Assessment & Plan: Visit Diagnoses:  1. Lumbar radicular pain   2. Sacral back pain     Plan: Given the clinical findings today and the MRI findings recommend MRI of the pelvis to rule out insufficiency fractures involving the right and left sacral ala.  If in fact she does have sacral fractures would recommend a DEXA scan given her age and family history of osteoporosis.  We will see her back after the MRI to go over the results and discuss further treatment.  Follow-Up Instructions: Return for after MRI.   Orders:  No orders of the defined types were placed in this encounter.  No orders of the defined types were placed in this encounter.     Procedures: No procedures performed   Clinical Data: No additional findings.   Subjective: Chief Complaint  Patient presents with  . Lower Back - Follow-up    HPI  Nicole Cisneros returns today for follow-up of her low back pain with radicular symptoms down both legs to go over the MRI of the lumbar spine.  She states she still having significant pain.  She states that she just wants the pain to go away.  She is having significant pain in the lower back area.  States she does not want pain medicine she just wants to find out what is going on. MRI MRI of the lumbar spine is reviewed with her images reviewed along with this lumbar spine model to demonstrate the anatomy.  MRI lumbar spine dated 08/02/2020 showed mild facet arthritic processes T12-L3.  No foraminal or canal stenosis at these levels.  34 central canal was patent.  Mild bilateral subarticular narrowing without nerve root impingement.  L4-5 minimal disc bulge central canal patent mild right neural foraminal stenosis and minimal  left subarticular stenosis without nerve impingement.  L5-S1 minimal disc bulge spinal canal patent.  Mild bilateral neural foraminal narrowing.  Mild edema bilateral sacral alla left greater than right possible degenerative versus early sacral insufficiency fracture. Discussed with the patient at length and she does note that she has had multiple falls since January 80s follow-up and from standing no high energy falls or injuries.  She also notes that she has a family history of osteoporosis.  Review of Systems  See HPI  Objective: Vital Signs: There were no vitals taken for this visit.  Physical Exam Constitutional:      Appearance: She is not ill-appearing or diaphoretic.  Neurological:     Mental Status: She is alert and oriented to person, place, and time.  Psychiatric:        Mood and Affect: Mood normal.     Ortho Exam Lumbar spine she has no tenderness over the lumbar spinal column.  She has tenderness left greater than right sacral ala region.  She sits with her pelvis oblique to offload the left buttocks region. Specialty Comments:  No specialty comments available.  Imaging: No results found.   PMFS History: Patient Active Problem List   Diagnosis Date Noted  . Hyperlipidemia 06/07/2020  . Myalgias, multiple 10/23/2014  .  Pain in joint, shoulder region 09/14/2014  . Left knee pain 06/21/2014  . GERD (gastroesophageal reflux disease) 03/14/2013  . Anemia 03/14/2013  . Smoking 03/14/2013  . Appendicitis 03/14/2011   Past Medical History:  Diagnosis Date  . Anemia   . Constipation   . COPD (chronic obstructive pulmonary disease) ([place])   . Cough   . GERD (gastroesophageal reflux disease)   . Headache(784.0)   . Migraine   . Sore throat   . Weakness   . Weight loss     Family History  Problem Relation Age of Onset  . Cancer Father        bone  . Cancer Sister        breast  . Heart disease Mother   . Hypertension Mother   . Asthma Other   .  Hyperlipidemia Other     Past Surgical History:  Procedure Laterality Date  . LAPAROSCOPIC APPENDECTOMY  03/14/2011   Procedure: APPENDECTOMY LAPAROSCOPIC;  Surgeon: Shann Medal, MD;  Location: [facility];  Service: General;  Laterality: N/A;  . TUBAL LIGATION     Social History   Occupational History  . Occupation: housekeeping  Tobacco Use  . Smoking status: Current Every Day Smoker    Packs/day: 1.00    Years: 12.00    Pack years: 12.00    Types: Cigarettes  . Smokeless tobacco: Never Used  . Tobacco comment: Smoking 1 ppd  Vaping Use  . Vaping Use: Never used  Substance and Sexual Activity  . Alcohol use: No    Alcohol/week: 0.0 standard drinks  . Drug use: No  . Sexual activity: Not on file

## 2020-08-08 ENCOUNTER — Telehealth: Payer: Self-pay

## 2020-08-08 ENCOUNTER — Other Ambulatory Visit: Payer: Self-pay

## 2020-08-08 NOTE — Telephone Encounter (Signed)
Attempted to contact patient though unable to leave voicemail to notify patient of cancelling tomorrow's scheduled PT visit as she is still awaiting further testing. PT was originally placed on hold on 07/12/20 pending f/u with physician.

## 2020-08-09 ENCOUNTER — Ambulatory Visit: Payer: No Typology Code available for payment source

## 2020-08-20 ENCOUNTER — Other Ambulatory Visit: Payer: Self-pay

## 2020-08-20 MED FILL — Pantoprazole Sodium EC Tab 40 MG (Base Equiv): ORAL | 10 days supply | Qty: 20 | Fill #0 | Status: AC

## 2020-08-24 ENCOUNTER — Other Ambulatory Visit: Payer: Self-pay

## 2020-08-24 ENCOUNTER — Ambulatory Visit
Admission: RE | Admit: 2020-08-24 | Discharge: 2020-08-24 | Disposition: A | Discharge status: Home / Self Care | Payer: No Typology Code available for payment source | Source: Ambulatory Visit | Attending: Orthopaedic Surgery | Admitting: Orthopaedic Surgery

## 2020-08-24 DIAGNOSIS — M5416 Radiculopathy, lumbar region: Secondary | ICD-10-CM

## 2020-08-24 DIAGNOSIS — M533 Sacrococcygeal disorders, not elsewhere classified: Secondary | ICD-10-CM

## 2020-09-03 ENCOUNTER — Ambulatory Visit (INDEPENDENT_AMBULATORY_CARE_PROVIDER_SITE_OTHER): Payer: Self-pay | Admitting: Orthopaedic Surgery

## 2020-09-03 ENCOUNTER — Encounter: Payer: Self-pay | Admitting: Orthopaedic Surgery

## 2020-09-03 DIAGNOSIS — G894 Chronic pain syndrome: Secondary | ICD-10-CM

## 2020-09-03 NOTE — Progress Notes (Signed)
The patient is continue to follow-up as a relates to her chronic pain syndrome.  She has had a work-up with x-rays of both knees as well as her pelvis.  She has had low back x-rays.  She has had MRI of her lumbar spine and now an MRI of her pelvis.  She does ambulate with a cane.  [facility]  She cannot take anti-inflammatories due to previous ulcer.  She does see a GI specialist tomorrow she states.  She does ambulate using a cane.  She is not morbidly obese.  She has taken muscle relaxers before and that has not helped.  We have had her in physical therapy and that has not helped.  The MRI of the lumbar spine showed some mild facet arthritis and foraminal narrowing at several levels but no nerve compression and no indication for any type of injections.  The MRI of her lumbar spine did show some edema in the SI joints so a MRI of the pelvis was warranted to rule out a stress fracture.  The MRI of the pelvis is reviewed with her and the hips have normal cartilage.  The SI joints do have some degenerative changes but no fractures.  I am at a loss of what else that I can recommend for her.  She may need an evaluation by rheumatologist so I have suggested she return to the [facility] who can potentially get that set up for her.  There is no indication for surgery.

## 2020-09-04 ENCOUNTER — Ambulatory Visit (INDEPENDENT_AMBULATORY_CARE_PROVIDER_SITE_OTHER): Payer: No Typology Code available for payment source | Admitting: Gastroenterology

## 2020-09-04 ENCOUNTER — Encounter: Payer: Self-pay | Admitting: Gastroenterology

## 2020-09-04 VITALS — BP 100/60 | HR 74 | Ht 64.0 in | Wt 167.0 lb

## 2020-09-04 DIAGNOSIS — B9681 Helicobacter pylori [H. pylori] as the cause of diseases classified elsewhere: Secondary | ICD-10-CM

## 2020-09-04 DIAGNOSIS — K219 Gastro-esophageal reflux disease without esophagitis: Secondary | ICD-10-CM

## 2020-09-04 DIAGNOSIS — K2970 Gastritis, unspecified, without bleeding: Secondary | ICD-10-CM

## 2020-09-04 MED ORDER — PANTOPRAZOLE SODIUM 40 MG PO TBEC: 40.0000 mg | DELAYED_RELEASE_TABLET | ORAL | 0 refills | Status: DC | PRN

## 2020-09-04 NOTE — Patient Instructions (Addendum)
It was a pleasure to see you today. Based on our discussion, I am providing you with my recommendations below:  RECOMMENDATION(S):   Please call the office when you need refills of Pantoprazole  LABS:   . Please proceed to the basement level for lab work IN 2-3 [place]. Press "B" on the elevator. The lab is located at the first door on the left as you exit the elevator.  HEALTHCARE LAWS AND MY CHART RESULTS:   . Due to recent changes in healthcare laws, you may see results of your imaging and/or laboratory studies on MyChart before I have had a chance to review them.  I understand that in some cases there may be results that are confusing or concerning to you. Please understand that not all results are received at same time and often I may need to interpret multiple results in order to provide you with the best plan of care or course of treatment. Therefore, I ask that you please give me 48 hours to thoroughly review all your results before contacting my office for clarification.   BMI:  . If you are age 18 or younger, your body mass index should be between 19-25. Your There is no height or weight on file to calculate BMI. If this is out of the aformentioned range listed, please consider follow up with your Primary Care Provider.   MY CHART:  The [facility] providers would like to encourage you to use [facility] to communicate with providers for non-urgent requests or questions.  Due to long hold times on the telephone, sending your provider a message by [facility] may be a faster and more efficient way to get a response.  Please allow 48 business hours for a response.  Please remember that this is for non-urgent requests.   FOLLOW UP:  . I would like for you to follow up with me as needed. Please call the office at [phone] when you feel you need to schedule an appointment.  Thank you for trusting me with your gastrointestinal care!    Thornton Park, MD, MPH

## 2020-09-04 NOTE — Progress Notes (Signed)
Referring Provider: Camillia Herter, NP Primary Care Physician:  Camillia Herter, NP  Chief complaint:  H pylori   IMPRESSION:  H pylori gastritis     - completed 10 days of Pylera    - symptoms have improved Reflux improved on treatment NSAID-related terminal ileitis    - was using naproxen for headache, reflux, dental pain No known family history of colon cancer or polyps   PLAN: Stop pantoprazole but may use it on a PRN basis after H pylori testing is complete H pylori stool antigen in 2-3 weeks Smoking cessation recommended Surveillance colonoscopy in 10 years, earlier with new symptoms  Follow-up PRN  Please see the "Patient Instructions" section for addition details about the plan.  HPI: Nicole Cisneros is a 50 y.o. female who returns in follow-up after endoscopic evaluation. She works in Actuary at [employer].  She had an EGD 06/13/20 to evaluate a several year history of severe acid reflux described a severe brash with associated cough, regurgitation, and vomiting requiring multiple ED evaluations.  Triggered by spicy or greasy foods. Worse at night.  She smokes cigarettes.  EGD 06/13/20 showed reflux and H pylori gastritis.   Screening colonoscopy 06/13/20 showed ileitis with terminal ileum erosion, hyperplastic polyp, and normal colon biopsies. She reported using naproxen for multiple pains over the years. Discontinued using at thie time of her colonoscopy.   Completed 10 days of Pylera with improvement in symptoms. She has reduced her pantroprazole to every 2-3 days, based on symptoms. She is overall feeling much better. Doesn't feel that additional evaluation or treatment is needed at this time.   Past Medical History:  Diagnosis Date  . Anemia   . Constipation   . COPD (chronic obstructive pulmonary disease) ([place])   . Cough   . GERD (gastroesophageal reflux disease)   . Headache(784.0)   . Migraine   . Sore throat   . Weakness   . Weight loss      Past Surgical History:  Procedure Laterality Date  . LAPAROSCOPIC APPENDECTOMY  03/14/2011   Procedure: APPENDECTOMY LAPAROSCOPIC;  Surgeon: Shann Medal, MD;  Location: [facility];  Service: General;  Laterality: N/A;  . TUBAL LIGATION      Current Outpatient Medications  Medication Sig Dispense Refill  . atorvastatin (LIPITOR) 20 MG tablet TAKE 1 TABLET (20 MG TOTAL) BY MOUTH DAILY. 90 tablet 0  . cyclobenzaprine (FLEXERIL) 5 MG tablet Take 1 tablet (5 mg total) by mouth daily for 21 days. 21 tablet 0  . pantoprazole (PROTONIX) 40 MG tablet TAKE 1 TABLET (40 MG TOTAL) BY MOUTH 2 (TWO) TIMES DAILY. 20 tablet 0  . PARoxetine (PAXIL) 10 MG tablet Take 1 tablet (10 mg total) by mouth daily. 30 tablet 0   No current facility-administered medications for this visit.    Allergies as of 09/04/2020 - Review Complete 09/04/2020  Allergen Reaction Noted  . Amoxicillin Nausea And Vomiting 03/13/2011  . Erythromycin Nausea And Vomiting 03/13/2011  . Percocet [oxycodone-acetaminophen] Nausea And Vomiting 03/13/2011  . Vicodin [hydrocodone-acetaminophen] Nausea And Vomiting 03/13/2011    Family History  Problem Relation Age of Onset  . Cancer Father        bone  . Cancer Sister        breast  . Heart disease Mother   . Hypertension Mother   . Asthma Other   . Hyperlipidemia Other     Social History   Socioeconomic History  . Marital status: Single  Spouse name: Not on file  . Number of children: 2  . Years of education: Not on file  . Highest education level: Not on file  Occupational History  . Occupation: housekeeping  Tobacco Use  . Smoking status: Current Every Day Smoker    Packs/day: 1.00    Years: 12.00    Pack years: 12.00    Types: Cigarettes  . Smokeless tobacco: Never Used  . Tobacco comment: Smoking 1 ppd  Vaping Use  . Vaping Use: Never used  Substance and Sexual Activity  . Alcohol use: No    Alcohol/week: 0.0 standard drinks  . Drug use: No  .  Sexual activity: Not on file  Other Topics Concern  . Not on file  Social History Narrative  . Not on file   Social Determinants of Health   Financial Resource Strain: Not on file  Food Insecurity: No Food Insecurity  . Worried About Charity fundraiser in the Last Year: Never true  . Ran Out of Food in the Last Year: Never true  Transportation Needs: Not on file  Physical Activity: Not on file  Stress: Not on file  Social Connections: Not on file  Intimate Partner Violence: Not on file     Physical Exam: General:   Alert,  well-nourished, pleasant and cooperative in NAD Head:  Normocephalic and atraumatic. Eyes:  Sclera clear, no icterus.   Conjunctiva pink. Ears:  Normal auditory acuity. Nose:  No deformity, discharge,  or lesions. Mouth:  No deformity or lesions.   Neck:  Supple; no masses or thyromegaly. Lungs:  Clear throughout to auscultation.   No wheezes. Heart:  Regular rate and rhythm; no murmurs. Abdomen:  Soft,nontender, nondistended, normal bowel sounds, no rebound or guarding. No hepatosplenomegaly.   Rectal:  Deferred  Msk:  Symmetrical. No boney deformities LAD: No inguinal or umbilical LAD Extremities:  No clubbing or edema. Neurologic:  Alert and  oriented x4;  grossly nonfocal Skin:  Intact without significant lesions or rashes. Psych:  Alert and cooperative. Normal mood and affect.     Layna Roeper L. Tarri Glenn, MD, MPH 09/04/2020, 10:56 AM

## 2020-09-21 ENCOUNTER — Encounter: Payer: Self-pay | Admitting: Physical Medicine and Rehabilitation

## 2020-10-09 ENCOUNTER — Other Ambulatory Visit: Payer: Self-pay

## 2020-10-09 MED FILL — Atorvastatin Calcium Tab 20 MG (Base Equivalent): ORAL | 30 days supply | Qty: 30 | Fill #1 | Status: CN

## 2020-10-11 ENCOUNTER — Ambulatory Visit
Admission: RE | Admit: 2020-10-11 | Discharge: 2020-10-11 | Disposition: A | Discharge status: Home / Self Care | Payer: No Typology Code available for payment source | Source: Ambulatory Visit | Attending: Family | Admitting: Family

## 2020-10-11 ENCOUNTER — Other Ambulatory Visit: Payer: Self-pay

## 2020-10-11 DIAGNOSIS — Z1231 Encounter for screening mammogram for malignant neoplasm of breast: Secondary | ICD-10-CM

## 2020-10-17 ENCOUNTER — Other Ambulatory Visit: Payer: Self-pay

## 2020-10-17 ENCOUNTER — Other Ambulatory Visit: Payer: Self-pay | Admitting: Family

## 2020-10-17 DIAGNOSIS — Z1231 Encounter for screening mammogram for malignant neoplasm of breast: Secondary | ICD-10-CM

## 2020-10-17 NOTE — Progress Notes (Signed)
Please call patient with update.   Possible mass of right breast.   We will need further imaging. An order for mammogram diagnostic bilateral and ultrasound of right breast including axillary ordered. Their office should call patient within 2 weeks with appointment details.

## 2020-10-18 ENCOUNTER — Other Ambulatory Visit: Payer: Self-pay | Admitting: Obstetrics and Gynecology

## 2020-10-18 ENCOUNTER — Other Ambulatory Visit: Payer: Self-pay | Admitting: Family

## 2020-10-18 DIAGNOSIS — R928 Other abnormal and inconclusive findings on diagnostic imaging of breast: Secondary | ICD-10-CM

## 2020-10-19 ENCOUNTER — Encounter (HOSPITAL_COMMUNITY): Payer: Self-pay | Admitting: Emergency Medicine

## 2020-10-19 ENCOUNTER — Other Ambulatory Visit: Payer: Self-pay

## 2020-10-19 ENCOUNTER — Emergency Department (HOSPITAL_COMMUNITY): Payer: No Typology Code available for payment source

## 2020-10-19 ENCOUNTER — Telehealth: Payer: Self-pay | Admitting: Family

## 2020-10-19 ENCOUNTER — Emergency Department (HOSPITAL_COMMUNITY)
Admission: EM | Admit: 2020-10-19 | Discharge: 2020-10-19 | Disposition: A | Discharge status: Home / Self Care | Payer: No Typology Code available for payment source | Attending: Emergency Medicine | Admitting: Emergency Medicine

## 2020-10-19 DIAGNOSIS — Z7951 Long term (current) use of inhaled steroids: Secondary | ICD-10-CM | POA: Insufficient documentation

## 2020-10-19 DIAGNOSIS — G8929 Other chronic pain: Secondary | ICD-10-CM | POA: Insufficient documentation

## 2020-10-19 DIAGNOSIS — J449 Chronic obstructive pulmonary disease, unspecified: Secondary | ICD-10-CM | POA: Insufficient documentation

## 2020-10-19 DIAGNOSIS — M25552 Pain in left hip: Secondary | ICD-10-CM

## 2020-10-19 DIAGNOSIS — F17210 Nicotine dependence, cigarettes, uncomplicated: Secondary | ICD-10-CM | POA: Insufficient documentation

## 2020-10-19 DIAGNOSIS — M5442 Lumbago with sciatica, left side: Secondary | ICD-10-CM | POA: Insufficient documentation

## 2020-10-19 DIAGNOSIS — M5432 Sciatica, left side: Secondary | ICD-10-CM

## 2020-10-19 MED ORDER — METHYLPREDNISOLONE 4 MG PO TBPK
ORAL_TABLET | ORAL | 0 refills | Status: DC
  Filled 2020-10-19: qty 21, 6d supply, fill #0
  Filled 2020-12-05: qty 21, 7d supply, fill #0

## 2020-10-19 MED ORDER — KETOROLAC TROMETHAMINE 60 MG/2ML IM SOLN: 60.0000 mg | Freq: Once | INTRAMUSCULAR | Status: DC

## 2020-10-19 MED ORDER — ACETAMINOPHEN 325 MG PO TABS
650.0000 mg | ORAL_TABLET | Freq: Once | ORAL | Status: AC
  Administered 2020-10-19: 650 mg via ORAL
  Filled 2020-10-19: qty 2

## 2020-10-19 NOTE — ED Provider Notes (Signed)
Emergency Medicine Provider Triage Evaluation Note  Nicole Cisneros 50 y.o. female was evaluated in triage.  Pt complains of left-sided back and neck pain that has been ongoing for 1 month.  No preceding trauma, injury.  She states that this pain is worse with movement, bending, getting up and sitting down.  She states that it sometimes will go down her leg. Denies fevers, weight loss, numbness/weakness of upper and lower extremities, bowel/bladder incontinence, saddle anesthesia, history of back surgery, history of IVDA.    Review of Systems  Positive: Back pain, hip pain Negative: Fevers, numbness/weakness   Physical Exam  BP 134/82   Pulse 70   Temp 98.2 F (36.8 C) (Oral)   Resp 18   Ht 5\' 4"  (1.626 m)   Wt 65.8 kg   SpO2 100%   BMI 24.89 kg/m  Gen:   Awake, no distress   HEENT:  Atraumatic  Resp:  Normal effort  Cardiac:  Normal rate  Abd:   Nondistended, nontender  MSK:   Moves extremities without difficulty.  Tenderness palpation noted to left lower back that extends in midline.  No deformity or step-offs noted. Neuro:  Speech clear   Other:      Medical Decision Making  Medically screening exam initiated at 9:58 AM  Appropriate orders placed.  Iona A Dunckel was informed that the remainder of the evaluation will be completed by another provider, this initial triage assessment does not replace that evaluation. They are counseled that they will need to remain in the ED until the completion of their workup, including full H&P and results of any tests.  Risks of leaving the emergency department prior to completion of treatment were discussed. Patient was advised to inform ED staff if they are leaving before their treatment is complete. The patient acknowledged these risks and time was allowed for questions.     The patient appears stable so that the remainder of the MSE may be completed by another provider.    Clinical Impression  Back pain   Portions of this note were  generated with Dragon dictation software. Dictation errors may occur despite best attempts at proofreading.     Volanda Napoleon, PA-C 10/19/20 [id number]    Valarie Merino, MD 10/21/20 [id number]

## 2020-10-19 NOTE — Telephone Encounter (Signed)
Pt calling regarding intense Hip pain. Pt is asking for an urgent  Referral for  Rheumatoid Arthritis. Please advise and thank you

## 2020-10-19 NOTE — ED Triage Notes (Signed)
Patient complains of right hip pain that started approximately one month ago. Pain is exacerbated by movement. Patient alert, oriented, ambulatory, and in no apparent distress at this time.

## 2020-10-19 NOTE — ED Provider Notes (Signed)
[facility] EMERGENCY DEPARTMENT Provider Note   CSN: [id number] Arrival date & time: 10/19/20  5885     History Chief Complaint  Patient presents with   Hip Pain    Nicole Cisneros is a 50 y.o. female.  HPI 50 year old female with a history of COPD, headaches, hyperlipidemia presents to the ER with complaints of chronic left hip pain and left low back pain which has been ongoing for over a month.  Patient was followed by Dr. Ninfa Linden and I reviewed his work-up.  She has had MRIs lumbar spine and pelvis, which did not show any indications for surgery.  He has also been through several rounds of physical therapy.  She states she was "discharged"from Dr. Trevor Mace office because she states that there was nothing else that he could do for her.  He cannot take ibuprofen due to history of stomach ulcers.  She continues to have left-sided pain radiating down into her left leg.  She denies any loss of bowel bladder control.  No fevers or chills.  No history of IV drug use, no night sweats, unintended weight los, no saddle anesthesia, no history of IV drug use.    Past Medical History:  Diagnosis Date   Anemia    Constipation    COPD (chronic obstructive pulmonary disease) (HCC)    Cough    GERD (gastroesophageal reflux disease)    Headache(784.0)    Migraine    Sore throat    Weakness    Weight loss     Patient Active Problem List   Diagnosis Date Noted   Hyperlipidemia 06/07/2020   Myalgias, multiple 10/23/2014   Pain in joint, shoulder region 09/14/2014   Left knee pain 06/21/2014   GERD (gastroesophageal reflux disease) 03/14/2013   Anemia 03/14/2013   Smoking 03/14/2013   Appendicitis 03/14/2011    Past Surgical History:  Procedure Laterality Date   LAPAROSCOPIC APPENDECTOMY  03/14/2011   Procedure: APPENDECTOMY LAPAROSCOPIC;  Surgeon: Shann Medal, MD;  Location: [facility];  Service: General;  Laterality: N/A;   TUBAL LIGATION       OB History   No  obstetric history on file.     Family History  Problem Relation Age of Onset   Heart disease Mother    Hypertension Mother    Cancer Father        bone   Breast cancer Sister    Cancer Sister        breast   Asthma Other    Hyperlipidemia Other     Social History   Tobacco Use   Smoking status: Every Day    Packs/day: 1.00    Years: 12.00    Pack years: 12.00    Types: Cigarettes   Smokeless tobacco: Never   Tobacco comments:    Smoking 1 ppd  Vaping Use   Vaping Use: Never used  Substance Use Topics   Alcohol use: No    Alcohol/week: 0.0 standard drinks   Drug use: No    Home Medications Prior to Admission medications   Medication Sig Start Date End Date Taking? Authorizing Provider  methylPREDNISolone (MEDROL DOSEPAK) 4 MG TBPK tablet Take as directed until finished 10/19/20  Yes Ural Acree A, PA-C  atorvastatin (LIPITOR) 20 MG tablet TAKE 1 TABLET (20 MG TOTAL) BY MOUTH DAILY. 06/07/20 06/07/21  Camillia Herter, NP  pantoprazole (PROTONIX) 40 MG tablet Take 1 tablet (40 mg total) by mouth as needed. 09/04/20   Thornton Park,  MD  PARoxetine (PAXIL) 10 MG tablet Take 1 tablet (10 mg total) by mouth daily. 07/31/20   Camillia Herter, NP  fluticasone (FLONASE) 50 MCG/ACT nasal spray Place 2 sprays into both nostrils daily. FOR NASAL CONGESTION 06/30/17 03/21/19  Kinnie Feil, PA-C  potassium chloride SA (K-DUR,KLOR-CON) 20 MEQ tablet Take 2 tablets (40 mEq total) by mouth 2 (two) times daily for 7 days. 06/30/17 08/23/19  Kinnie Feil, PA-C    Allergies    Amoxicillin, Erythromycin, Percocet [oxycodone-acetaminophen], and Vicodin [hydrocodone-acetaminophen]  Review of Systems   Review of Systems  Constitutional:  Negative for fever.  Musculoskeletal:  Positive for arthralgias.  Neurological:  Negative for weakness and numbness.   Physical Exam Updated Vital Signs BP 117/83   Pulse (!) 52   Temp 97.9 F (36.6 C)   Resp 18   SpO2 98%   Physical  Exam Vitals and nursing note reviewed.  Constitutional:      General: She is not in acute distress.    Appearance: She is well-developed. She is not ill-appearing or diaphoretic.  HENT:     Head: Normocephalic and atraumatic.  Eyes:     Conjunctiva/sclera: Conjunctivae normal.  Cardiovascular:     Rate and Rhythm: Normal rate and regular rhythm.     Heart sounds: No murmur heard. Pulmonary:     Effort: Pulmonary effort is normal. No respiratory distress.     Breath sounds: Normal breath sounds.  Abdominal:     Palpations: Abdomen is soft.     Tenderness: There is no abdominal tenderness.  Musculoskeletal:        General: No tenderness.     Cervical back: Neck supple.     Left lower leg: No edema.     Comments: No C, T, mild L-spine midline tenderness.  5/5 strength in upper and lower extremities.  No noticeable step-offs, crepitus, fluctuance, erythema.  Sensations intact.  Full range of motion and strength of neck. Moving all 4 extremities without difficulty.     Skin:    General: Skin is warm and dry.     Coloration: Skin is not pale.  Neurological:     General: No focal deficit present.     Mental Status: She is alert and oriented to person, place, and time.     Sensory: No sensory deficit.     Motor: No weakness.  Psychiatric:        Mood and Affect: Mood normal.        Behavior: Behavior normal.    ED Results / Procedures / Treatments   Labs (all labs ordered are listed, but only abnormal results are displayed) Labs Reviewed - No data to display  EKG None  Radiology DG Lumbar Spine Complete  Result Date: 10/19/2020 CLINICAL DATA:  Hip pain EXAM: LUMBAR SPINE - COMPLETE 4+ VIEW COMPARISON:  Radiograph 05/23/2020 FINDINGS: There is no evidence of lumbar spine fracture. Alignment is normal. Disc heights are preserved. Mild multilevel facet arthropathy. IMPRESSION: Mild multilevel facet arthropathy.  Disc heights are preserved. No evidence of lumbar spine fracture.  Electronically Signed   By: Maurine Simmering   On: 10/19/2020 10:56   DG Hip Unilat W or Wo Pelvis 2-3 Views Left  Result Date: 10/19/2020 CLINICAL DATA:  Hip pain on the left for 1 month EXAM: DG HIP (WITH OR WITHOUT PELVIS) 2-3V LEFT COMPARISON:  Pelvis MRI 08/24/2020 FINDINGS: Mildly rotated frontal view. No evidence of fracture or erosion. No degenerative hip narrowing or spurring.  IMPRESSION: No acute finding. Electronically Signed   By: Monte Fantasia M.D.   On: 10/19/2020 10:52    Procedures Procedures   Medications Ordered in ED Medications  acetaminophen (TYLENOL) tablet 650 mg (has no administration in time range)    ED Course  I have reviewed the triage vital signs and the nursing notes.  Pertinent labs & imaging results that were available during my care of the patient were reviewed by me and considered in my medical decision making (see chart for details).    MDM Rules/Calculators/A&P                          50 year old female with chronic low back pain and left hip pain.  Had MRIs of her pelvis and lumbar spine done earlier this year.  She has no signs of cauda equina, low suspicion for spinal abscess given no history of IV drug use, afebrile, not tachycardic.  Physical exam overall reassuring, minimal midline tenderness.  She was discharged from Dr. Trevor Mace office, did review his note, he did recommend possible rheumatology referral.  Patient given Tylenol here, will prescribe Medrol Dosepak as she does endorse symptoms rating down her left leg likely consistent with sciatica.  Will refer to rheumatology as per discussion with Dr. Ninfa Linden.  I explained to the patient that unfortunately it is unlikely that we will be able to find solutions to her problem here in the ER.  I did recommend that she follow-up with rheumatology, she may consider possible pain management if this continues.  I do not think she needs any further emergent imaging at this time.  Stable for  discharge. Final Clinical Impression(s) / ED Diagnoses Final diagnoses:  Left hip pain  Sciatica of left side    Rx / DC Orders ED Discharge Orders          Ordered    methylPREDNISolone (MEDROL DOSEPAK) 4 MG TBPK tablet        10/19/20 1352             Garald Balding, PA-C 10/19/20 1355    Charlesetta Shanks, MD 11/04/20 [phone]

## 2020-10-19 NOTE — Discharge Instructions (Addendum)
Please take the Medrol Dosepak as directed until finished.  You may also take Tylenol for pain.  I reviewed Dr. Trevor Mace notes, he does recommend that you possibly follow-up with a rheumatologist to be evaluated for rheumatoid arthritis.  I have provided contact information for [facility] rheumatology, please call the phone number and schedule an appointment.  You may continue to take Tylenol for pain.  Return to the ER if you have any loss of bowel or bladder control, numbness in your legs, inability to walk, etc.

## 2020-10-20 ENCOUNTER — Other Ambulatory Visit: Payer: Self-pay | Admitting: Family

## 2020-10-20 DIAGNOSIS — M25559 Pain in unspecified hip: Secondary | ICD-10-CM

## 2020-10-20 NOTE — Telephone Encounter (Signed)
Per patient request referral to Rheumatology. Their office should call patient with appointment details within 2 weeks.

## 2020-10-26 ENCOUNTER — Other Ambulatory Visit: Payer: Self-pay

## 2020-11-08 ENCOUNTER — Other Ambulatory Visit: Payer: Self-pay

## 2020-11-08 ENCOUNTER — Ambulatory Visit
Admission: RE | Admit: 2020-11-08 | Discharge: 2020-11-08 | Disposition: A | Discharge status: Home / Self Care | Payer: No Typology Code available for payment source | Source: Ambulatory Visit | Attending: Obstetrics and Gynecology | Admitting: Obstetrics and Gynecology

## 2020-11-08 ENCOUNTER — Other Ambulatory Visit: Payer: Self-pay | Admitting: Family

## 2020-11-08 ENCOUNTER — Ambulatory Visit: Payer: No Typology Code available for payment source

## 2020-11-08 ENCOUNTER — Ambulatory Visit
Admission: RE | Admit: 2020-11-08 | Discharge: 2020-11-08 | Disposition: A | Discharge status: Home / Self Care | Payer: No Typology Code available for payment source | Source: Ambulatory Visit | Attending: Family | Admitting: Family

## 2020-11-08 ENCOUNTER — Ambulatory Visit: Payer: Self-pay | Admitting: *Deleted

## 2020-11-08 ENCOUNTER — Telehealth: Payer: Self-pay | Admitting: Family

## 2020-11-08 VITALS — BP 102/76 | Wt 162.0 lb

## 2020-11-08 DIAGNOSIS — R928 Other abnormal and inconclusive findings on diagnostic imaging of breast: Secondary | ICD-10-CM

## 2020-11-08 DIAGNOSIS — N6310 Unspecified lump in the right breast, unspecified quadrant: Secondary | ICD-10-CM

## 2020-11-08 DIAGNOSIS — N888 Other specified noninflammatory disorders of cervix uteri: Secondary | ICD-10-CM

## 2020-11-08 DIAGNOSIS — Z01419 Encounter for gynecological examination (general) (routine) without abnormal findings: Secondary | ICD-10-CM

## 2020-11-08 NOTE — Patient Instructions (Addendum)
Explained breast self awareness with Nicole Cisneros. Pap smear completed today. Let patient know that next Pap smear will be due based on the result of today's Pap smear due to her history. Told patient will send referral to the [facility] for [place] for the cervical mass. Explained that [place] will cover if the Pap smear is abnormal and that the visit will not be covered if Pap smear is normal. Patient informed about the [facility] assistance. Referred patient to the [facility] for a right breast diagnostic mammogram per recommendation. Appointment scheduled Thursday, November 08, 2020 at 1340. Patient aware of appointment and will be there. Let patient know will follow up with her within the next couple weeks with results of Pap smear by phone. Discussed smoking cessation with patient. Referred to the [facility] Quitline and gave resources to the free smoking cessation classes at [facility]. Nicole Cisneros verbalized understanding.  Sidney Silberman, Arvil Chaco, RN 11:41 AM

## 2020-11-08 NOTE — Progress Notes (Signed)
Patient ID: Nicole Cisneros, female    DOB: 1970-08-29  MRN: [id number]  CC: Left Hip Pain and Back Pain   Subjective: Nicole Cisneros is a 50 y.o. female who presents for  Her concerns today include:  Visit 09/03/2020 at [facility] per MD note: The patient is continue to follow-up as a relates to her chronic pain syndrome.  She has had a work-up with x-rays of both knees as well as her pelvis.  She has had low back x-rays.  She has had MRI of her lumbar spine and now an MRI of her pelvis.  She does ambulate with a cane.  [facility].  She cannot take anti-inflammatories due to previous ulcer.  She does see a GI specialist tomorrow she states.  She does ambulate using a cane.  She is not morbidly obese.  She has taken muscle relaxers before and that has not helped.  We have had her in physical therapy and that has not helped.   The MRI of the lumbar spine showed some mild facet arthritis and foraminal narrowing at several levels but no nerve compression and no indication for any type of injections.  The MRI of her lumbar spine did show some edema in the SI joints so a MRI of the pelvis was warranted to rule out a stress fracture.  The MRI of the pelvis is reviewed with her and the hips have normal cartilage.  The SI joints do have some degenerative changes but no fractures.   I am at a loss of what else that I can recommend for her.  She may need an evaluation by rheumatologist so I have suggested she return to the [facility] who can potentially get that set up for her.  There is no indication for surgery.  10/22/2020 per Rheumatology referral request: We appreciate your referral. Each referral is reviewed by our providers to ensure that the patient will receive the best medical care possible. Upon review of this referral Dr. Benjamine Mola has declined it.   Dr. Benjamine Mola reviewed imaging and 2016 RA workup was negative.  Dr Benjamine Mola believes there would be more benefit for patient to see pain management or maybe PM&R. Thanks!  11/09/2020: Patient reports left hip and back causing severe pain. Would like second opinion referral to Rheumatology. She is not interested in referral to Pain Management or Rehabilitation.   2. HYPERLIPIDEMIA FOLLOW-UP: Doing well on current regimen, requesting refills.   3. GERD FOLLOW-UP: 04/25/2020: - Continue Omeprazole and Mylanta as prescribed. - Keep appointment with Gastroenterology scheduled for 05/04/2020.  11/09/2020: Doing well on current regimen, requesting refills.   4. HOT FLASHES FOLLOW-UP: 07/31/2020: - Discontinue Gabapentin. - Paroxetine as prescribed. Follow-up with primary provider in 4 weeks or sooner if needed.   11/09/2020: Doing well on current regimen, requesting refills.   Patient Active Problem List   Diagnosis Date Noted   Hyperlipidemia 06/07/2020   Myalgias, multiple 10/23/2014   Pain in joint, shoulder region 09/14/2014   Left knee pain 06/21/2014   GERD (gastroesophageal reflux disease) 03/14/2013   Anemia 03/14/2013   Smoking 03/14/2013   Appendicitis 03/14/2011     Current Outpatient Medications on File Prior to Visit  Medication Sig Dispense Refill   methylPREDNISolone (MEDROL DOSEPAK) 4 MG TBPK tablet Take as directed until finished (Patient not taking: Reported on 11/08/2020) 21 each 0   [DISCONTINUED] fluticasone (FLONASE) 50 MCG/ACT nasal  spray Place 2 sprays into both nostrils daily. FOR NASAL CONGESTION 16 g 0   [DISCONTINUED] potassium chloride SA (K-DUR,KLOR-CON) 20 MEQ tablet Take 2 tablets (40 mEq total) by mouth 2 (two) times daily for 7 days. 28 tablet 0   No current facility-administered medications on file prior to visit.    Allergies  Allergen Reactions   Amoxicillin Nausea And Vomiting   Erythromycin Nausea And Vomiting   Percocet [Oxycodone-Acetaminophen] Nausea And Vomiting   Vicodin [Hydrocodone-Acetaminophen]  Nausea And Vomiting    Social History   Socioeconomic History   Marital status: Single    Spouse name: Not on file   Number of children: 2   Years of education: Not on file   Highest education level: Not on file  Occupational History   Occupation: housekeeping  Tobacco Use   Smoking status: Every Day    Packs/day: 1.00    Years: 12.00    Pack years: 12.00    Types: Cigarettes   Smokeless tobacco: Never   Tobacco comments:    Smoking 1 ppd  Vaping Use   Vaping Use: Never used  Substance and Sexual Activity   Alcohol use: No    Alcohol/week: 0.0 standard drinks   Drug use: No   Sexual activity: Not Currently    Birth control/protection: None  Other Topics Concern   Not on file  Social History Narrative   Not on file   Social Determinants of Health   Financial Resource Strain: Not on file  Food Insecurity: No Food Insecurity   Worried About Charity fundraiser in the Last Year: Never true   Ran Out of Food in the Last Year: Never true  Transportation Needs: Unmet Transportation Needs   Lack of Transportation (Medical): Yes   Lack of Transportation (Non-Medical): Yes  Physical Activity: Not on file  Stress: Not on file  Social Connections: Not on file  Intimate Partner Violence: Not on file    Family History  Problem Relation Age of Onset   Heart disease Mother    Hypertension Mother    Cancer Father        bone   Breast cancer Sister    Cancer Sister        breast   Asthma Other    Hyperlipidemia Other     Past Surgical History:  Procedure Laterality Date   LAPAROSCOPIC APPENDECTOMY  03/14/2011   Procedure: APPENDECTOMY LAPAROSCOPIC;  Surgeon: Shann Medal, MD;  Location: [facility];  Service: General;  Laterality: N/A;   TUBAL LIGATION      ROS: Review of Systems Negative except as stated above  PHYSICAL EXAM: BP 138/87   Pulse 66   Temp 98.1 F (36.7 C)   Resp 19   Ht 5' 4.37" (1.635 m)   Wt 161 lb (73 kg)   SpO2 99%   BMI 27.32 kg/m     ASSESSMENT AND PLAN: 1. Arthritis of lumbar spine: 2. Chronic left hip pain: - Patient recently denied appointment with Vernelle Emerald, MD at [facility] Rheumatology.  - Patient recently discharged from Jean Rosenthal, MD at Orthopedics. - Today patient declined referral to Pain Management Clinic.  - Today patient declined referral to Rehabilitation.  - Per patient request referral to Rheumatology for second opinion.  - Ambulatory referral to Rheumatology  3. Hyperlipidemia, unspecified hyperlipidemia type: - Continue Atorvastatin as prescribed. - Lipid panel to screen for level of cholesterol control.  - Follow-up with primary provider as scheduled.  -  atorvastatin (LIPITOR) 20 MG tablet; TAKE 1 TABLET (20 MG TOTAL) BY MOUTH DAILY.  Dispense: 90 tablet; Refill: 0 - Lipid panel  4. Gastroesophageal reflux disease, unspecified whether esophagitis present: - Continue Pantoprazole as prescribed . - Follow-up with primary provider as scheduled.  - pantoprazole (PROTONIX) 40 MG tablet; Take 1 tablet (40 mg total) by mouth as needed.  Dispense: 90 tablet; Refill: 0  5. Hot flashes due to menopause: - Continue Paroxetine as prescribed.  - Follow-up with primary provider as scheduled.  - PARoxetine (PAXIL) 10 MG tablet; Take 1 tablet (10 mg total) by mouth daily.  Dispense: 90 tablet; Refill: 0   Patient was given the opportunity to ask questions.  Patient verbalized understanding of the plan and was able to repeat key elements of the plan. Patient was given clear instructions to go to Emergency Department or return to medical center if symptoms don't improve, worsen, or new problems develop.The patient verbalized understanding.   Orders Placed This Encounter  Procedures   Lipid panel    Requested Prescriptions   Signed Prescriptions Disp Refills   atorvastatin (LIPITOR) 20 MG tablet 90 tablet 0    Sig: TAKE 1 TABLET (20 MG TOTAL) BY MOUTH DAILY.   pantoprazole  (PROTONIX) 40 MG tablet 90 tablet 0    Sig: Take 1 tablet (40 mg total) by mouth as needed.   PARoxetine (PAXIL) 10 MG tablet 90 tablet 0    Sig: Take 1 tablet (10 mg total) by mouth daily.    Follow-up with primary provider as scheduled.   Nicole Herter, NP

## 2020-11-08 NOTE — Telephone Encounter (Signed)
Patient stated she was told to go to [facility] and she states they told her they can not see her today. Patient is upset and confused to why she was told to go and everything was not completed for her visit. Patient stated she would like to speak with someone about what is going in.

## 2020-11-08 NOTE — Progress Notes (Signed)
[name] is a 50 y.o. G2P0 female [facility] complaint of diffuse left breast pain since April 2022 that comes and goes. Patient rates the pain at a 6-7 out of 10. Patient referred to [facility] by the [place] due to recommending additional imaging of the right breast. Screening mammogram completed 10/11/2020.   Pap Smear: Pap smear completed today. Last Pap smear was over 20 years and was normal per patient. Per patient has history of an abnormal Pap smear when she was in her 20's that a colposcopy and cryotherapy was completed for follow-up. Patient unsure if she has had a Pap smear since her cryotherapy. Last Pap smear result is not available in Epic.   Physical exam: Breasts Breasts symmetrical. No skin abnormalities bilateral breasts. No nipple retraction bilateral breasts. No nipple discharge bilateral breasts. No lymphadenopathy. No lumps palpated bilateral breasts. No complaints of pain or tenderness on exam.  MS DIGITAL SCREENING TOMO BILATERAL  Result Date: 10/17/2020 CLINICAL DATA:  Screening. EXAM: DIGITAL SCREENING BILATERAL MAMMOGRAM WITH TOMOSYNTHESIS AND CAD TECHNIQUE: Bilateral screening digital craniocaudal and mediolateral oblique mammograms were obtained. Bilateral screening digital breast tomosynthesis was performed. The images were evaluated with computer-aided detection. COMPARISON:  Previous exam(s). ACR Breast Density Category b: There are scattered areas of fibroglandular density. FINDINGS: In the right breast, a possible mass warrants further evaluation. This possible mass is seen within the outer RIGHT breast, anterior to middle depth, cc view only, slice 43. In the left breast, no findings suspicious for malignancy. IMPRESSION: Further evaluation is suggested for a possible mass in the right breast. RECOMMENDATION: Diagnostic mammogram and possibly ultrasound of the right breast. (Code:[id number]-R-[id number]) The patient will be contacted  regarding the findings, and additional imaging will be scheduled. BI-RADS CATEGORY  0: Incomplete. Need additional imaging evaluation and/or prior mammograms for comparison. Electronically Signed   By: Franki Cabot M.D.   On: 10/17/2020 09:18        Pelvic/Bimanual Ext Genitalia No lesions, no swelling and no discharge observed on external genitalia.        Vagina Vagina pink and normal texture. No lesions or discharge observed in vagina.        Cervix Cervix is present. Cervix pink and of normal texture. Mass observed on cervix between 10 o'clock and 2 o'clock. No discharge observed. Will refer to the [facility] for [place] to follow-up for cervical mass.   Uterus Uterus is present and palpable. Uterus in normal position and normal size.        Adnexae Bilateral ovaries present and palpable. No tenderness on palpation.         Rectovaginal No rectal exam completed today since patient had no rectal complaints. No skin abnormalities observed on exam.     Smoking History: Patient is a current smoker. Discussed smoking cessation with patient. Referred to the [facility] Quitline and gave resources to the free smoking cessation classes at [facility].   Patient Navigation: Patient education provided. Access to services provided for patient through [facility] program. Transportation provided to [facility] appointment, Breast Center appointment, and home following appointments.  Colorectal Cancer Screening: Per patient has had colonoscopy completed on 06/13/2020 at [facility].  No complaints today.    Breast and Cervical Cancer Risk Assessment: Patient has family history of her sister having breast cancer. Patient has no known genetic mutations or history of radiation treatment to the chest before age 60. Per patient has history of  cervical dysplasia. Patient has no history of being immunocompromised or DES exposure in-utero.  Risk Assessment     Risk Scores       11/08/2020   Last  edited by: Royston Bake, CMA   5-year risk: 1.9 %   Lifetime risk: 13.3 %            A: B[facility]CP exam with pap smear Complaint of left breast diffuse breast pain.  P: Referred patient to the [facility] for a right breast diagnostic mammogram per recommendation. Appointment scheduled Thursday, November 08, 2020 at 1340.  Loletta Parish, RN 11/08/2020 11:41 AM

## 2020-11-09 ENCOUNTER — Encounter: Payer: Self-pay | Admitting: Family

## 2020-11-09 ENCOUNTER — Other Ambulatory Visit: Payer: Self-pay

## 2020-11-09 ENCOUNTER — Ambulatory Visit (INDEPENDENT_AMBULATORY_CARE_PROVIDER_SITE_OTHER): Payer: Self-pay | Admitting: Family

## 2020-11-09 VITALS — BP 138/87 | HR 66 | Temp 98.1°F | Resp 19 | Ht 64.37 in | Wt 161.0 lb

## 2020-11-09 DIAGNOSIS — G8929 Other chronic pain: Secondary | ICD-10-CM

## 2020-11-09 DIAGNOSIS — N951 Menopausal and female climacteric states: Secondary | ICD-10-CM

## 2020-11-09 DIAGNOSIS — K219 Gastro-esophageal reflux disease without esophagitis: Secondary | ICD-10-CM

## 2020-11-09 DIAGNOSIS — M25552 Pain in left hip: Secondary | ICD-10-CM

## 2020-11-09 DIAGNOSIS — M47816 Spondylosis without myelopathy or radiculopathy, lumbar region: Secondary | ICD-10-CM

## 2020-11-09 DIAGNOSIS — E785 Hyperlipidemia, unspecified: Secondary | ICD-10-CM

## 2020-11-09 MED ORDER — PANTOPRAZOLE SODIUM 40 MG PO TBEC: 40.0000 mg | DELAYED_RELEASE_TABLET | ORAL | 0 refills | Status: DC | PRN

## 2020-11-09 MED ORDER — PAROXETINE HCL 10 MG PO TABS
10.0000 mg | ORAL_TABLET | Freq: Every day | ORAL | 0 refills | Status: DC
  Filled 2020-11-09: qty 30, 30d supply, fill #0

## 2020-11-09 MED ORDER — ATORVASTATIN CALCIUM 20 MG PO TABS
ORAL_TABLET | Freq: Every day | ORAL | 0 refills | Status: AC
  Filled 2020-11-09: qty 30, 30d supply, fill #0

## 2020-11-09 NOTE — Progress Notes (Signed)
Probable benign mass of right breast. Patient was scheduled for recommended right breast biopsy prior to leaving [facility] per radiology report.

## 2020-11-09 NOTE — Progress Notes (Signed)
Pt presents for arthritis and left hip pain.pain is radiating down to foot causing limited mobility  Pt nees refill on atorvastatin, pantoprazole, and paroxetine

## 2020-11-10 LAB — LIPID PANEL
Chol/HDL Ratio: 3.5 ratio (ref 0.0–4.4)
Cholesterol, Total: 225 mg/dL — ABNORMAL HIGH (ref 100–199)
HDL: 65 mg/dL (ref >39)
LDL Chol Calc (NIH): 132 mg/dL — ABNORMAL HIGH (ref 0–99)
Triglycerides: 157 mg/dL — ABNORMAL HIGH (ref 0–149)
VLDL Cholesterol Cal: 28 mg/dL (ref 5–40)

## 2020-11-10 NOTE — Progress Notes (Signed)
Please call patient with update.   Cholesterol remaining elevated but overall improved some since previous lab 5 months ago. Continue Atorvastatin at current dose. Encouraged to recheck in 3 to 6 months.  The following is for provider reference only: The 10-year ASCVD risk score is: 5.1%   Values used to calculate the score:     Age: 50 years     Sex: Female     Is Non-Hispanic African American: Yes     Diabetic: No     Tobacco smoker: Yes     Systolic Blood Pressure: 0000000 mmHg     Is BP treated: No     HDL Cholesterol: 65 mg/dL     Total Cholesterol: 225 mg/dL

## 2020-11-12 ENCOUNTER — Other Ambulatory Visit: Payer: Self-pay

## 2020-11-12 ENCOUNTER — Telehealth: Payer: Self-pay

## 2020-11-12 LAB — CYTOLOGY - PAP
Comment: NEGATIVE
Diagnosis: NEGATIVE
High risk HPV: NEGATIVE

## 2020-11-12 NOTE — Telephone Encounter (Signed)
Called patient to give pap smear results. Informed patient that pap was normal and HPV was negative. Next pap will be due in 3-5 years based on previous hx. Informed patient that we will refer her to the [facility] for follow-up for the lump that was seen on her cervix during pelvic exam. Will mail patient financial assistance application. Patient voiced understanding.

## 2020-11-13 ENCOUNTER — Ambulatory Visit
Admission: RE | Admit: 2020-11-13 | Discharge: 2020-11-13 | Disposition: A | Discharge status: Home / Self Care | Payer: No Typology Code available for payment source | Source: Ambulatory Visit | Attending: Family | Admitting: Family

## 2020-11-13 ENCOUNTER — Other Ambulatory Visit: Payer: Self-pay

## 2020-11-13 DIAGNOSIS — N6310 Unspecified lump in the right breast, unspecified quadrant: Secondary | ICD-10-CM

## 2020-11-23 ENCOUNTER — Encounter: Payer: Self-pay | Admitting: Physical Medicine and Rehabilitation

## 2020-12-03 ENCOUNTER — Ambulatory Visit: Payer: No Typology Code available for payment source | Attending: Family

## 2020-12-03 ENCOUNTER — Other Ambulatory Visit: Payer: Self-pay

## 2020-12-05 ENCOUNTER — Other Ambulatory Visit: Payer: Self-pay

## 2020-12-05 ENCOUNTER — Other Ambulatory Visit (HOSPITAL_COMMUNITY)
Admission: RE | Admit: 2020-12-05 | Discharge: 2020-12-05 | Disposition: A | Discharge status: Home / Self Care | Payer: No Typology Code available for payment source | Source: Ambulatory Visit | Attending: Obstetrics and Gynecology | Admitting: Obstetrics and Gynecology

## 2020-12-05 ENCOUNTER — Encounter: Payer: Self-pay | Admitting: Obstetrics and Gynecology

## 2020-12-05 ENCOUNTER — Ambulatory Visit (INDEPENDENT_AMBULATORY_CARE_PROVIDER_SITE_OTHER): Payer: Self-pay | Admitting: Obstetrics and Gynecology

## 2020-12-05 VITALS — BP 118/75 | HR 58 | Ht 64.0 in | Wt 159.2 lb

## 2020-12-05 DIAGNOSIS — Z0000 Encounter for general adult medical examination without abnormal findings: Secondary | ICD-10-CM

## 2020-12-05 DIAGNOSIS — N950 Postmenopausal bleeding: Secondary | ICD-10-CM | POA: Insufficient documentation

## 2020-12-05 DIAGNOSIS — N888 Other specified noninflammatory disorders of cervix uteri: Secondary | ICD-10-CM

## 2020-12-05 NOTE — Addendum Note (Signed)
Addended by: Mena Goes on: 12/05/2020 11:49 AM   Modules accepted: Orders

## 2020-12-05 NOTE — Progress Notes (Signed)
GYNECOLOGY OFFICE VISIT NOTE  History:   Nicole Cisneros is a 50 y.o. [id number] here today for a spot on her cervix - she was told they thought a cyst. She does note intermittent vaginal bleeding every few months. She feels like she went through menopause at age 39.   She denies any other gynecologic concerns.    Past Medical History:  Diagnosis Date   Anemia    Constipation    COPD (chronic obstructive pulmonary disease) (HCC)    Cough    GERD (gastroesophageal reflux disease)    Headache(784.0)    Migraine    Sore throat    Weakness    Weight loss     Past Surgical History:  Procedure Laterality Date   LAPAROSCOPIC APPENDECTOMY  03/14/2011   Procedure: APPENDECTOMY LAPAROSCOPIC;  Surgeon: Shann Medal, MD;  Location: [place];  Service: General;  Laterality: N/A;   TUBAL LIGATION      The following portions of the patient's history were reviewed and updated as appropriate: allergies, current medications, past family history, past medical history, past social history, past surgical history and problem list.   Health Maintenance:  Normal pap and negative HRHPV on 10/2020.  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    Review of Systems:  Pertinent items noted in HPI and remainder of comprehensive ROS otherwise negative.  Physical Exam:  BP 118/75   Pulse (!) 58   Wt 159 lb 3.2 oz (72.2 kg)   BMI 27.01 kg/m  CONSTITUTIONAL: Well-developed, well-nourished female in no acute distress.  HEENT:  Normocephalic, atraumatic. External right and left ear normal. No scleral icterus.  NECK: Normal range of motion, supple, no masses noted on observation SKIN: No rash noted. Not diaphoretic. No erythema. No pallor. MUSCULOSKELETAL: Normal range of motion. No edema noted. NEUROLOGIC: Alert and oriented to person, place, and time. Normal muscle tone coordination. No cranial nerve deficit noted. PSYCHIATRIC: Normal mood and affect.  Normal behavior. Normal judgment and thought content.  CARDIOVASCULAR: Normal heart rate noted RESPIRATORY: Effort and breath sounds normal, no problems with respiration noted ABDOMEN: No masses noted. No other overt distention noted.    PELVIC: Normal appearing external genitalia; normal urethral meatus; normal appearing vaginal mucosa and cervix - nabothian cyst noted at 12 oclock.  No abnormal discharge noted.  Normal uterine size, no other palpable masses, no uterine or adnexal tenderness. Performed in the presence of a chaperone.    Procedure ENDOMETRIAL BIOPSY     The indications for endometrial biopsy were reviewed.   Risks of the biopsy including cramping, bleeding, infection, uterine perforation, inadequate specimen and need for additional procedures were discussed. Offered alternative of hysteroscopy, dilation and curettage in OR. The patient states she understands the R/B/I/A and agrees to undergo procedure today. Urine pregnancy test was Not indicated. Consent was signed. Time out was performed.    Patient was positioned in dorsal lithotomy position. A vaginal speculum was placed.  The cervix was visualized and was prepped with Betadine.  A single-toothed tenaculum was placed on the anterior lip of the cervix to stabilize it. The 3 mm pipelle was easily introduced into the endometrial cavity without difficulty to a depth of 7 cm, and a Scant amount of tissue was obtained after two passes and sent to pathology. The instruments were removed from the patient's vagina. Minimal bleeding from the cervix was noted. The patient tolerated the procedure well.   Patient  was given post procedure instructions.  Will follow up pathology and manage accordingly; patient will be contacted with results and recommendations.     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 Reviewed benign nature and no additional follow up needed for this -  Pap/HPV neg/neg on 10/2020. Can continue with routine pap guidelines.   Postmenopausal bleeding - Endometrial biopsy done today for possible postmenopausal bleeding. Scant tissue - suspect benign. If insufficient tissue, plan will be for [place] and pt agrees with plan [facility] -     Surgical pathology( [place])  Return in about 1 year (around 12/05/2021).  For routine annual.   Radene Gunning, MD, [place] for [employer], [place]

## 2020-12-06 LAB — FOLLICLE STIMULATING HORMONE: FSH: 81.2 m[IU]/mL

## 2020-12-07 LAB — SURGICAL PATHOLOGY

## 2020-12-10 NOTE — Addendum Note (Signed)
Addended by: Radene Gunning A on: 12/10/2020 08:23 AM   Modules accepted: Orders

## 2020-12-12 ENCOUNTER — Other Ambulatory Visit: Payer: Self-pay
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
## 2021-01-12 ENCOUNTER — Encounter: Payer: Self-pay | Admitting: Radiology

## 2021-01-15 ENCOUNTER — Ambulatory Visit (INDEPENDENT_AMBULATORY_CARE_PROVIDER_SITE_OTHER): Payer: Self-pay | Admitting: Rheumatology

## 2021-01-15 ENCOUNTER — Ambulatory Visit (HOSPITAL_COMMUNITY)
Admission: RE | Admit: 2021-01-15 | Discharge: 2021-01-15 | Disposition: A | Discharge status: Home / Self Care | Payer: No Typology Code available for payment source | Source: Ambulatory Visit | Attending: Rheumatology | Admitting: Rheumatology

## 2021-01-15 ENCOUNTER — Other Ambulatory Visit: Payer: Self-pay

## 2021-01-15 ENCOUNTER — Telehealth: Payer: Self-pay

## 2021-01-15 ENCOUNTER — Other Ambulatory Visit (HOSPITAL_COMMUNITY): Payer: Self-pay | Admitting: Rheumatology

## 2021-01-15 ENCOUNTER — Encounter: Payer: Self-pay | Admitting: Rheumatology

## 2021-01-15 VITALS — BP 116/74 | HR 56 | Ht 65.0 in | Wt 161.0 lb

## 2021-01-15 DIAGNOSIS — F17200 Nicotine dependence, unspecified, uncomplicated: Secondary | ICD-10-CM

## 2021-01-15 DIAGNOSIS — Z8719 Personal history of other diseases of the digestive system: Secondary | ICD-10-CM

## 2021-01-15 DIAGNOSIS — M25552 Pain in left hip: Secondary | ICD-10-CM

## 2021-01-15 DIAGNOSIS — Z8639 Personal history of other endocrine, nutritional and metabolic disease: Secondary | ICD-10-CM

## 2021-01-15 DIAGNOSIS — Z862 Personal history of diseases of the blood and blood-forming organs and certain disorders involving the immune mechanism: Secondary | ICD-10-CM

## 2021-01-15 DIAGNOSIS — R52 Pain, unspecified: Secondary | ICD-10-CM

## 2021-01-15 DIAGNOSIS — M25561 Pain in right knee: Secondary | ICD-10-CM

## 2021-01-15 DIAGNOSIS — M79672 Pain in left foot: Secondary | ICD-10-CM

## 2021-01-15 DIAGNOSIS — M79641 Pain in right hand: Secondary | ICD-10-CM

## 2021-01-15 DIAGNOSIS — M79642 Pain in left hand: Secondary | ICD-10-CM

## 2021-01-15 DIAGNOSIS — G894 Chronic pain syndrome: Secondary | ICD-10-CM

## 2021-01-15 DIAGNOSIS — M791 Myalgia, unspecified site: Secondary | ICD-10-CM

## 2021-01-15 DIAGNOSIS — M25562 Pain in left knee: Secondary | ICD-10-CM

## 2021-01-15 DIAGNOSIS — M533 Sacrococcygeal disorders, not elsewhere classified: Secondary | ICD-10-CM

## 2021-01-15 DIAGNOSIS — G8929 Other chronic pain: Secondary | ICD-10-CM

## 2021-01-15 DIAGNOSIS — M47816 Spondylosis without myelopathy or radiculopathy, lumbar region: Secondary | ICD-10-CM

## 2021-01-15 LAB — SEDIMENTATION RATE: Sed Rate: 17 mm/hr (ref 0–22)

## 2021-01-15 NOTE — Telephone Encounter (Signed)
Per Dr. Estanislado Pandy, please call patient with lab and x-ray results once received from [facility]. Patient was provided with prescriptions today for labs and x-ray and was instructed to have those performed at the hospital. Follow up can more than likely be canceled once results are received.Thanks!

## 2021-01-15 NOTE — Patient Instructions (Signed)
Knee Exercises Ask your health care provider which exercises are safe for you. Do exercises exactly as told by your health care provider and adjust them as directed. It is normal to feel mild stretching, pulling, tightness, or discomfort as you do these exercises. Stop right away if you feel sudden pain or your pain gets worse. Do not begin these exercises until told by your health care provider. Stretching and range-of-motion exercises These exercises warm up your muscles and joints and improve the movement and flexibility of your knee. These exercises also help to relieve pain and swelling. Knee extension, prone Lie on your abdomen (prone position) on a bed. Place your left / right knee just beyond the edge of the surface so your knee is not on the bed. You can put a towel under your left / right thigh just above your kneecap for comfort. Relax your leg muscles and allow gravity to straighten your knee (extension). You should feel a stretch behind your left / right knee. Hold this position for __________ seconds. Scoot up so your knee is supported between repetitions. Repeat __________ times. Complete this exercise __________ times a day. Knee flexion, active  Lie on your back with both legs straight. If this causes back discomfort, bend your left / right knee so your foot is flat on the floor. Slowly slide your left / right heel back toward your buttocks. Stop when you feel a gentle stretch in the front of your knee or thigh (flexion). Hold this position for __________ seconds. Slowly slide your left / right heel back to the starting position. Repeat __________ times. Complete this exercise __________ times a day. Quadriceps stretch, prone  Lie on your abdomen on a firm surface, such as a bed or padded floor. Bend your left / right knee and hold your ankle. If you cannot reach your ankle or pant leg, loop a belt around your foot and grab the belt instead. Gently pull your heel toward your  buttocks. Your knee should not slide out to the side. You should feel a stretch in the front of your thigh and knee (quadriceps). Hold this position for __________ seconds. Repeat __________ times. Complete this exercise __________ times a day. Hamstring, supine Lie on your back (supine position). Loop a belt or towel over the ball of your left / right foot. The ball of your foot is on the walking surface, right under your toes. Straighten your left / right knee and slowly pull on the belt to raise your leg until you feel a gentle stretch behind your knee (hamstring). Do not let your knee bend while you do this. Keep your other leg flat on the floor. Hold this position for __________ seconds. Repeat __________ times. Complete this exercise __________ times a day. Strengthening exercises These exercises build strength and endurance in your knee. Endurance is the ability to use your muscles for a long time, even after they get tired. Quadriceps, isometric This exercise stretches the muscles in front of your thigh (quadriceps) without moving your knee joint (isometric). Lie on your back with your left / right leg extended and your other knee bent. Put a rolled towel or small pillow under your knee if told by your health care provider. Slowly tense the muscles in the front of your left / right thigh. You should see your kneecap slide up toward your hip or see increased dimpling just above the knee. This motion will push the back of the knee toward the floor. For __________   seconds, hold the muscle as tight as you can without increasing your pain. Relax the muscles slowly and completely. Repeat __________ times. Complete this exercise __________ times a day. Straight leg raises This exercise stretches the muscles in front of your thigh (quadriceps) and the muscles that move your hips (hip flexors). Lie on your back with your left / right leg extended and your other knee bent. Tense the muscles in  the front of your left / right thigh. You should see your kneecap slide up or see increased dimpling just above the knee. Your thigh may even shake a bit. Keep these muscles tight as you raise your leg 4-6 inches (10-15 cm) off the floor. Do not let your knee bend. Hold this position for __________ seconds. Keep these muscles tense as you lower your leg. Relax your muscles slowly and completely after each repetition. Repeat __________ times. Complete this exercise __________ times a day. Hamstring, isometric Lie on your back on a firm surface. Bend your left / right knee about __________ degrees. Dig your left / right heel into the surface as if you are trying to pull it toward your buttocks. Tighten the muscles in the back of your thighs (hamstring) to "dig" as hard as you can without increasing any pain. Hold this position for __________ seconds. Release the tension gradually and allow your muscles to relax completely for __________ seconds after each repetition. Repeat __________ times. Complete this exercise __________ times a day. Hamstring curls If told by your health care provider, do this exercise while wearing ankle weights. Begin with __________ lb weights. Then increase the weight by 1 lb (0.5 kg) increments. Do not wear ankle weights that are more than __________ lb. Lie on your abdomen with your legs straight. Bend your left / right knee as far as you can without feeling pain. Keep your hips flat against the floor. Hold this position for __________ seconds. Slowly lower your leg to the starting position. Repeat __________ times. Complete this exercise __________ times a day. Squats This exercise strengthens the muscles in front of your thigh and knee (quadriceps). Stand in front of a table, with your feet and knees pointing straight ahead. You may rest your hands on the table for balance but not for support. Slowly bend your knees and lower your hips like you are going to sit in a  chair. Keep your weight over your heels, not over your toes. Keep your lower legs upright so they are parallel with the table legs. Do not let your hips go lower than your knees. Do not bend lower than told by your health care provider. If your knee pain increases, do not bend as low. Hold the squat position for __________ seconds. Slowly push with your legs to return to standing. Do not use your hands to pull yourself to standing. Repeat __________ times. Complete this exercise __________ times a day. Wall slides This exercise strengthens the muscles in front of your thigh and knee (quadriceps). Lean your back against a smooth wall or door, and walk your feet out 18-24 inches (46-61 cm) from it. Place your feet hip-width apart. Slowly slide down the wall or door until your knees bend __________ degrees. Keep your knees over your heels, not over your toes. Keep your knees in line with your hips. Hold this position for __________ seconds. Repeat __________ times. Complete this exercise __________ times a day. Straight leg raises This exercise strengthens the muscles that rotate the leg at the hip and   move it away from your body (hip abductors). Lie on your side with your left / right leg in the top position. Lie so your head, shoulder, knee, and hip line up. You may bend your bottom knee to help you keep your balance. Roll your hips slightly forward so your hips are stacked directly over each other and your left / right knee is facing forward. Leading with your heel, lift your top leg 4-6 inches (10-15 cm). You should feel the muscles in your outer hip lifting. Do not let your foot drift forward. Do not let your knee roll toward the ceiling. Hold this position for __________ seconds. Slowly return your leg to the starting position. Let your muscles relax completely after each repetition. Repeat __________ times. Complete this exercise __________ times a day. Straight leg raises This  exercise stretches the muscles that move your hips away from the front of the pelvis (hip extensors). Lie on your abdomen on a firm surface. You can put a pillow under your hips if that is more comfortable. Tense the muscles in your buttocks and lift your left / right leg about 4-6 inches (10-15 cm). Keep your knee straight as you lift your leg. Hold this position for __________ seconds. Slowly lower your leg to the starting position. Let your leg relax completely after each repetition. Repeat __________ times. Complete this exercise __________ times a day. This information is not intended to replace advice given to you by your health care provider. Make sure you discuss any questions you have with your health care provider. Document Revised: 01/19/2018 Document Reviewed: 01/19/2018 Elsevier Patient Education  2022 [place]. Back Exercises The following exercises strengthen the muscles that help to support the trunk (torso) and back. They also help to keep the lower back flexible. Doing these exercises can help to prevent or lessen existing low back pain. If you have back pain or discomfort, try doing these exercises 2-3 times each day or as told by your health care provider. As your pain improves, do them once each day, but increase the number of times that you repeat the steps for each exercise (do more repetitions). To prevent the recurrence of back pain, continue to do these exercises once each day or as told by your health care provider. Do exercises exactly as told by your health care provider and adjust them as directed. It is normal to feel mild stretching, pulling, tightness, or discomfort as you do these exercises, but you should stop right away if you feel sudden pain or your pain gets worse. Exercises Single knee to chest Repeat these steps 3-5 times for each leg: Lie on your back on a firm bed or the floor with your legs extended. Bring one knee to your chest. Your other leg  should stay extended and in contact with the floor. Hold your knee in place by grabbing your knee or thigh with both hands and hold. Pull on your knee until you feel a gentle stretch in your lower back or buttocks. Hold the stretch for 10-30 seconds. Slowly release and straighten your leg. Pelvic tilt Repeat these steps 5-10 times: Lie on your back on a firm bed or the floor with your legs extended. Bend your knees so they are pointing toward the ceiling and your feet are flat on the floor. Tighten your lower abdominal muscles to press your lower back against the floor. This motion will tilt your pelvis so your tailbone points up toward the ceiling instead of pointing  to your feet or the floor. With gentle tension and even breathing, hold this position for 5-10 seconds. Cat-cow Repeat these steps until your lower back becomes more flexible: Get into a hands-and-knees position on a firm bed or the floor. Keep your hands under your shoulders, and keep your knees under your hips. You may place padding under your knees for comfort. Let your head hang down toward your chest. Contract your abdominal muscles and point your tailbone toward the floor so your lower back becomes rounded like the back of a cat. Hold this position for 5 seconds. Slowly lift your head, let your abdominal muscles relax, and point your tailbone up toward the ceiling so your back forms a sagging arch like the back of a cow. Hold this position for 5 seconds.  Press-ups Repeat these steps 5-10 times: Lie on your abdomen (face-down) on a firm bed or the floor. Place your palms near your head, about shoulder-width apart. Keeping your back as relaxed as possible and keeping your hips on the floor, slowly straighten your arms to raise the top half of your body and lift your shoulders. Do not use your back muscles to raise your upper torso. You may adjust the placement of your hands to make yourself more comfortable. Hold this  position for 5 seconds while you keep your back relaxed. Slowly return to lying flat on the floor.  Bridges Repeat these steps 10 times: Lie on your back on a firm bed or the floor. Bend your knees so they are pointing toward the ceiling and your feet are flat on the floor. Your arms should be flat at your sides, next to your body. Tighten your buttocks muscles and lift your buttocks off the floor until your waist is at almost the same height as your knees. You should feel the muscles working in your buttocks and the back of your thighs. If you do not feel these muscles, slide your feet 1-2 inches (2.5-5 cm) farther away from your buttocks. Hold this position for 3-5 seconds. Slowly lower your hips to the starting position, and allow your buttocks muscles to relax completely. If this exercise is too easy, try doing it with your arms crossed over your chest. Abdominal crunches Repeat these steps 5-10 times: Lie on your back on a firm bed or the floor with your legs extended. Bend your knees so they are pointing toward the ceiling and your feet are flat on the floor. Cross your arms over your chest. Tip your chin slightly toward your chest without bending your neck. Tighten your abdominal muscles and slowly raise your torso high enough to lift your shoulder blades a tiny bit off the floor. Avoid raising your torso higher than that because it can put too much stress on your lower back and does not help to strengthen your abdominal muscles. Slowly return to your starting position. Back lifts Repeat these steps 5-10 times: Lie on your abdomen (face-down) with your arms at your sides, and rest your forehead on the floor. Tighten the muscles in your legs and your buttocks. Slowly lift your chest off the floor while you keep your hips pressed to the floor. Keep the back of your head in line with the curve in your back. Your eyes should be looking at the floor. Hold this position for 3-5  seconds. Slowly return to your starting position. Contact a health care provider if: Your back pain or discomfort gets much worse when you do an exercise. Your worsening  back pain or discomfort does not lessen within 2 hours after you exercise. If you have any of these problems, stop doing these exercises right away. Do not do them again unless your health care provider says that you can. Get help right away if: You develop sudden, severe back pain. If this happens, stop doing the exercises right away. Do not do them again unless your health care provider says that you can. This information is not intended to replace advice given to you by your health care provider. Make sure you discuss any questions you have with your health care provider. Document Revised: 06/13/2020 Document Reviewed: 06/13/2020 Elsevier Patient Education  [place].

## 2021-01-16 NOTE — Progress Notes (Signed)
X-ray of the right hand was unremarkable.

## 2021-01-16 NOTE — Progress Notes (Signed)
X-ray of the left foot was unremarkable.

## 2021-01-17 LAB — CYCLIC CITRUL PEPTIDE ANTIBODY, IGG/IGA: CCP Antibodies IgG/IgA: 3 units (ref 0–19)

## 2021-01-17 LAB — RHEUMATOID FACTOR: Rheumatoid fact SerPl-aCnc: <10 IU/mL (ref <14.0)

## 2021-01-17 NOTE — Telephone Encounter (Signed)
Per lab note: Rheumatoid factor, anti-CCP and sedimentation rate are within normal limits.  All the x-rays were unremarkable.  Please notify patient that the labs and x-rays do not indicate rheumatoid arthritis or autoimmune disease.  Please cancel follow-up appointment.

## 2021-01-17 NOTE — Progress Notes (Signed)
Rheumatoid factor, anti-CCP and sedimentation rate are within normal limits.  All the x-rays were unremarkable.  Please notify patient that the labs and x-rays do not indicate rheumatoid arthritis or autoimmune disease.  Please cancel follow-up appointment.

## 2021-02-05 ENCOUNTER — Ambulatory Visit: Payer: No Typology Code available for payment source | Admitting: Rheumatology

## 2021-04-27 NOTE — Progress Notes (Signed)
Erroneous encounter

## 2021-04-29 ENCOUNTER — Encounter: Payer: No Typology Code available for payment source | Admitting: Family

## 2021-05-13 ENCOUNTER — Ambulatory Visit (HOSPITAL_COMMUNITY)
Admission: EM | Admit: 2021-05-13 | Discharge: 2021-05-13 | Disposition: A | Discharge status: Home / Self Care | Payer: No Typology Code available for payment source | Attending: Sports Medicine | Admitting: Sports Medicine

## 2021-05-13 ENCOUNTER — Other Ambulatory Visit: Payer: Self-pay

## 2021-05-13 ENCOUNTER — Encounter (HOSPITAL_COMMUNITY): Payer: Self-pay | Admitting: Emergency Medicine

## 2021-05-13 DIAGNOSIS — M65311 Trigger thumb, right thumb: Secondary | ICD-10-CM

## 2021-05-13 DIAGNOSIS — M25531 Pain in right wrist: Secondary | ICD-10-CM

## 2021-05-13 DIAGNOSIS — G8929 Other chronic pain: Secondary | ICD-10-CM

## 2021-05-13 NOTE — ED Provider Notes (Signed)
[facility]    CSN: [id number] Arrival date & time: 05/13/21  1254      History   Chief Complaint Chief Complaint  Patient presents with   Hand Pain    HPI Nicole Cisneros is a 51 y.o. female here for right thumb pain   Hand Pain   Right dorsal wrist pain x few weeks Having "triggering" of the right thumb x 1 month Intermittent right and left wrist pain - saw "arthritis doctor" but they didn't do anything with it. She is not taking anything in terms of medication. She intermittently has pain in both wrist, but her more concerning pain is the pain at the volar base of the right thumb.  With repetitive gripping she will get locking of the thumb and will have to pry it open.  This causes her great discomfort.  She does report some localized swelling but no redness, erythema, no signs of fever or chills.  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  - X-rays on 01/15/21, right hand reviewed: FINDINGS: No acute fracture or dislocation of the bilateral hands. Normal bony mineralization. Joint spaces are maintained. No bony erosion or periostitis. No evidence of other focal bone abnormality. No abnormal soft tissue mineralization. No focal soft tissue swelling.   IMPRESSION: No significant arthropathy of the bilateral hands.  - X-rays on 01/15/21, left hand: FINDINGS: No acute fracture or dislocation of the bilateral hands. Normal bony mineralization. Joint spaces are maintained. No bony erosion or periostitis. No evidence of other focal bone abnormality. No abnormal soft tissue mineralization. No focal soft tissue swelling.   IMPRESSION: No significant arthropathy of the bilateral hands.   Past Medical History:  Diagnosis Date   Anemia    Constipation    COPD (chronic obstructive pulmonary disease) (HCC)    Cough    GERD (gastroesophageal reflux disease)    Headache(784.0)    Migraine     Sore throat    Weakness    Weight loss     Patient Active Problem List   Diagnosis Date Noted   Hyperlipidemia 06/07/2020   Myalgias, multiple 10/23/2014   Pain in joint, shoulder region 09/14/2014   Left knee pain 06/21/2014   GERD (gastroesophageal reflux disease) 03/14/2013   Anemia 03/14/2013   Smoking 03/14/2013   Appendicitis 03/14/2011    Past Surgical History:  Procedure Laterality Date   LAPAROSCOPIC APPENDECTOMY  03/14/2011   Procedure: APPENDECTOMY LAPAROSCOPIC;  Surgeon: Shann Medal, MD;  Location: [facility];  Service: General;  Laterality: N/A;   TUBAL LIGATION      OB History     Gravida  5   Para  2   Term  2   Preterm      AB  3   Living  2      SAB  3   IAB      Ectopic      Multiple      Live Births  2            Home Medications    Prior to Admission medications   Medication Sig Start Date End Date Taking? Authorizing Provider  atorvastatin (LIPITOR) 20 MG tablet TAKE 1 TABLET (20 MG TOTAL) BY MOUTH DAILY. 11/09/20 11/09/21  Camillia Herter, NP  methylPREDNISolone (MEDROL DOSEPAK) 4 MG TBPK tablet Take as directed until finished Patient not taking: No sig reported 10/19/20   Garald Balding,  PA-C  pantoprazole (PROTONIX) 40 MG tablet Take 1 tablet (40 mg total) by mouth as needed. 11/09/20 02/07/21  Camillia Herter, NP  PARoxetine (PAXIL) 10 MG tablet Take 1 tablet (10 mg total) by mouth daily. 11/09/20 02/07/21  Camillia Herter, NP  fluticasone (FLONASE) 50 MCG/ACT nasal spray Place 2 sprays into both nostrils daily. FOR NASAL CONGESTION 06/30/17 03/21/19  Kinnie Feil, PA-C  potassium chloride SA (K-DUR,KLOR-CON) 20 MEQ tablet Take 2 tablets (40 mEq total) by mouth 2 (two) times daily for 7 days. 06/30/17 08/23/19  Kinnie Feil, PA-C    Family History Family History  Problem Relation Age of Onset   Heart disease Mother    Hypertension Mother    Rheum arthritis Mother    Gout Mother    Cancer Father        bone    Rheum arthritis Father    Breast cancer Sister    Cancer Sister        breast   Heart disease Sister    Arthritis Sister    Epilepsy Brother    Heart Problems Brother    Arthritis Brother    Arthritis Brother    Arthritis Brother    Asthma Son    Asthma Daughter     Social History Social History   Tobacco Use   Smoking status: Every Day    Packs/day: 1.00    Years: 12.00    Pack years: 12.00    Types: Cigarettes   Smokeless tobacco: Never   Tobacco comments:    Smoking 1 ppd  Vaping Use   Vaping Use: Never used  Substance Use Topics   Alcohol use: No    Alcohol/week: 0.0 standard drinks   Drug use: No     Allergies   Amoxicillin, Aspirin, Erythromycin, Percocet [oxycodone-acetaminophen], and Vicodin [hydrocodone-acetaminophen]   Review of Systems Review of Systems  Constitutional:  Negative for chills and fever.  Musculoskeletal:  Positive for arthralgias (right thumb pain, right wrist pain).  Skin:  Negative for color change and rash.  Neurological:  Negative for weakness.    Physical Exam Triage Vital Signs ED Triage Vitals  Enc Vitals Group     BP 05/13/21 1422 (!) 156/110     Pulse Rate 05/13/21 1422 72     Resp 05/13/21 1422 18     Temp 05/13/21 1422 98 F (36.7 C)     Temp Source 05/13/21 1422 Oral     SpO2 05/13/21 1422 98 %     Weight --      Height --      Head Circumference --      Peak Flow --      Pain Score 05/13/21 1421 10     Pain Loc --      Pain Edu? --      Excl. in [place]? --    No data found.  Updated Vital Signs BP (!) 156/110 (BP Location: Left Arm)    Pulse 72    Temp 98 F (36.7 C) (Oral)    Resp 18    SpO2 98%   Physical Exam Gen: Well-appearing, in no acute distress; non-toxic CV: Regular Rate. Well-perfused. Warm.  Resp: Breathing unlabored on room air; no wheezing. Psych: Fluid speech in conversation; appropriate affect; normal thought process Neuro: Sensation intact throughout. No gross coordination deficits.   MSK:  - Right hand/wrist: + TTP at volar base of thumb MCP joint with palpable nodule. Reproducible "catching/triggering" with forced  flex --> extension. No erythema, warmth. Mild soft tissue swelling around this area and volar wrist. FROM at wrist. 5/5 strength with flex/extension of wrist. + Diminished grip strength 2/2 to pain. Negative Tinel's at carpal tunnel. Neurovascularly intact distally.    UC Treatments / Results  Labs (all labs ordered are listed, but only abnormal results are displayed) Labs Reviewed - No data to display  EKG   Radiology No results found.  Procedures Procedures (including critical care time)  Medications Ordered in UC Medications - No data to display  Initial Impression / Assessment and Plan / UC Course  I have reviewed the triage vital signs and the nursing notes.  Pertinent labs & imaging results that were available during my care of the patient were reviewed by me and considered in my medical decision making (see chart for details).     Trigger finger of right thumb Acute on chronic right wrist pain  Patient presents with right wrist pain, but more so in the thumb with triggering episodes over the last month or so.  She does have a palpable nodule on the volar aspect of the A1 pulley with symptomatic triggering able to be reproduced on exam today.  Patient is intolerant to anti-inflammatories as she has a history of aspirin allergy and gastric ulcer, show she is unable to take these at that time.  We walk-through all treatment options, such as giving a trial of a low-dose prednisone, although she would like to defer this if possible.  I did provide local information for sports medicine and orthopedic office for her to follow-up for possible trigger finger injection therapy, she is amenable and agreeable to this plan.  We discussed icing this area for 15 to 20 minutes multiple times daily to help calm down the inflammation.  We did not have any thumb  spica braces here in the urgent care, but she may pick this up over-the-counter if desires.  Questions provided. Final Clinical Impressions(s) / UC Diagnoses   Final diagnoses:  Trigger thumb of right hand  Chronic pain of right wrist     Discharge Instructions      Ice the thumb/wrist for 15-20 minutes at least 3 times daily  You may look online or at local stores for "thumb spica" brace to be worn for pain control   Call to set up appointment at sports medicine center - you may ask for me if you wish      ED Prescriptions   None    PDMP not reviewed this encounter.   Elba Barman, DO 05/13/21 1514

## 2021-05-13 NOTE — Discharge Instructions (Addendum)
Ice the thumb/wrist for 15-20 minutes at least 3 times daily  You may look online or at local stores for "thumb spica" brace to be worn for pain control   Call to set up appointment at sports medicine center - you may ask for me if you wish

## 2021-05-13 NOTE — Progress Notes (Signed)
Patient ID: DAJIAH KOOI, female    DOB: [birth year]  MRN: [id number]  CC: Urgent Care Follow-Up  Subjective: Nicole Cisneros is a 51 y.o. female who presents for urgent care follow-up.   Her concerns today include:  URGENT CARE FOLLOW-UP: 05/13/2021 at [facility] per DO note: Trigger finger of right thumb Acute on chronic right wrist pain   Patient presents with right wrist pain, but more so in the thumb with triggering episodes over the last month or so.  She does have a palpable nodule on the volar aspect of the A1 pulley with symptomatic triggering able to be reproduced on exam today.  Patient is intolerant to anti-inflammatories as she has a history of aspirin allergy and gastric ulcer, show she is unable to take these at that time.  We walk-through all treatment options, such as giving a trial of a low-dose prednisone, although she would like to defer this if possible.  I did provide local information for sports medicine and orthopedic office for her to follow-up for possible trigger finger injection therapy, she is amenable and agreeable to this plan.  We discussed icing this area for 15 to 20 minutes multiple times daily to help calm down the inflammation.  We did not have any thumb spica braces here in the urgent care, but she may pick this up over-the-counter if desires.  Questions provided.  05/14/2021: Reports scheduled for appointment on 05/15/2021 at [place].  2. MENOPAUSE: Paroxetine not helping. Established with [facility] for other women's related health issues. Has been calling for an appointment without response. Would like referral back to Gynecology.   3. PREDIABETES: Requesting repeat screening.   Patient Active Problem List   Diagnosis Date Noted   Hyperlipidemia 06/07/2020   Myalgias, multiple 10/23/2014   Pain in joint, shoulder region 09/14/2014   Left knee pain 06/21/2014   GERD  (gastroesophageal reflux disease) 03/14/2013   Anemia 03/14/2013   Smoking 03/14/2013   Appendicitis 03/14/2011     Current Outpatient Medications on File Prior to Visit  Medication Sig Dispense Refill   atorvastatin (LIPITOR) 20 MG tablet TAKE 1 TABLET (20 MG TOTAL) BY MOUTH DAILY. 90 tablet 0   methylPREDNISolone (MEDROL DOSEPAK) 4 MG TBPK tablet Take as directed until finished (Patient not taking: No sig reported) 21 each 0   pantoprazole (PROTONIX) 40 MG tablet Take 1 tablet (40 mg total) by mouth as needed. 90 tablet 0   PARoxetine (PAXIL) 10 MG tablet Take 1 tablet (10 mg total) by mouth daily. 90 tablet 0   [DISCONTINUED] fluticasone (FLONASE) 50 MCG/ACT nasal spray Place 2 sprays into both nostrils daily. FOR NASAL CONGESTION 16 g 0   [DISCONTINUED] potassium chloride SA (K-DUR,KLOR-CON) 20 MEQ tablet Take 2 tablets (40 mEq total) by mouth 2 (two) times daily for 7 days. 28 tablet 0   No current facility-administered medications on file prior to visit.    Allergies  Allergen Reactions   Amoxicillin Nausea And Vomiting   Aspirin Hives   Erythromycin Nausea And Vomiting   Percocet [Oxycodone-Acetaminophen] Nausea And Vomiting   Vicodin [Hydrocodone-Acetaminophen] Nausea And Vomiting    Social History   Socioeconomic History   Marital status: Single    Spouse name: Not on file   Number of children: 2   Years of education: Not on file   Highest education level: Not on file  Occupational History   Occupation: housekeeping  Tobacco Use   Smoking status: Every Day    Packs/day: 1.00    Years: 12.00    Pack years: 12.00    Types: Cigarettes   Smokeless tobacco: Never   Tobacco comments:    Smoking 1 ppd  Vaping Use   Vaping Use: Never used  Substance and Sexual Activity   Alcohol use: No    Alcohol/week: 0.0 standard drinks   Drug use: No   Sexual activity: Not Currently    Birth control/protection: None  Other Topics Concern   Not on file  Social History  Narrative   Not on file   Social Determinants of Health   Financial Resource Strain: Not on file  Food Insecurity: Food Insecurity Present   Worried About Charity fundraiser in the Last Year: Sometimes true   Ran Out of Food in the Last Year: Sometimes true  Transportation Needs: Unmet Transportation Needs   Lack of Transportation (Medical): Yes   Lack of Transportation (Non-Medical): Yes  Physical Activity: Not on file  Stress: Not on file  Social Connections: Not on file  Intimate Partner Violence: Not on file    Family History  Problem Relation Age of Onset   Heart disease Mother    Hypertension Mother    Rheum arthritis Mother    Gout Mother    Cancer Father        bone   Rheum arthritis Father    Breast cancer Sister    Cancer Sister        breast   Heart disease Sister    Arthritis Sister    Epilepsy Brother    Heart Problems Brother    Arthritis Brother    Arthritis Brother    Arthritis Brother    Asthma Son    Asthma Daughter     Past Surgical History:  Procedure Laterality Date   LAPAROSCOPIC APPENDECTOMY  03/14/2011   Procedure: APPENDECTOMY LAPAROSCOPIC;  Surgeon: Shann Medal, MD;  Location: [facility];  Service: General;  Laterality: N/A;   TUBAL LIGATION      ROS: Review of Systems Negative except as stated above  PHYSICAL EXAM: BP 115/67 (BP Location: Left Arm, Patient Position: Sitting, Cuff Size: Normal)    Pulse (!) 57    Temp 98.5 F (36.9 C)    Resp 18    Ht 5\' 5"  (1.651 m)    Wt 158 lb (71.7 kg)    SpO2 97%    BMI 26.29 kg/m   Physical Exam HENT:     Head: Normocephalic and atraumatic.  Eyes:     Extraocular Movements: Extraocular movements intact.     Conjunctiva/sclera: Conjunctivae normal.     Pupils: Pupils are equal, round, and reactive to light.  Cardiovascular:     Rate and Rhythm: Normal rate and regular rhythm.     Pulses: Normal pulses.     Heart sounds: Normal heart sounds.  Pulmonary:     Effort: Pulmonary effort is  normal.     Breath sounds: Normal breath sounds.  Musculoskeletal:     Cervical back: Normal range of motion and neck supple.  Neurological:     General: No focal deficit present.     Mental Status: She is alert and oriented to person, place, and time.  Psychiatric:        Mood and Affect: Mood normal.        Behavior: Behavior normal.    Results for orders placed or performed in visit  on 05/14/21  POCT glycosylated hemoglobin (Hb A1C)  Result Value Ref Range   Hemoglobin A1C 5.8 (A) 4.0 - 5.6 %   HbA1c POC (<> result, manual entry)     HbA1c, POC (prediabetic range)     HbA1c, POC (controlled diabetic range)      ASSESSMENT AND PLAN: 1. Trigger finger of right thumb: 2. Chronic pain of right wrist: - Keep scheduled appointment with Elba Barman, DO at [place] on 05/15/2021.  3. Prediabetes: - Hemoglobin A1c today 5.8% and remaining consistent with prediabetes.  - Follow-up with primary provider in 6 months or sooner if needed.  - POCT glycosylated hemoglobin (Hb A1C)  4. Postmenopausal bleeding: 5. Hot flashes due to menopause: 6. Cervical cyst: - Referral to Gynecology for further evaluation and management.  - Ambulatory referral to Gynecology   Patient was given the opportunity to ask questions.  Patient verbalized understanding of the plan and was able to repeat key elements of the plan. Patient was given clear instructions to go to Emergency Department or return to medical center if symptoms don't improve, worsen, or new problems develop.The patient verbalized understanding.   Orders Placed This Encounter  Procedures   POCT glycosylated hemoglobin (Hb A1C)    Follow-up with primary provider as scheduled.   Nicole Herter, NP

## 2021-05-13 NOTE — ED Triage Notes (Signed)
Pt reports right hand pain for 3 weeks. Reports right wrist pain for a couple months. Reports knot on thumb and reports hurts with movement.

## 2021-05-14 ENCOUNTER — Encounter: Payer: Self-pay | Admitting: Family

## 2021-05-14 ENCOUNTER — Ambulatory Visit (INDEPENDENT_AMBULATORY_CARE_PROVIDER_SITE_OTHER): Payer: Self-pay | Admitting: Family

## 2021-05-14 ENCOUNTER — Other Ambulatory Visit: Payer: Self-pay

## 2021-05-14 VITALS — BP 115/67 | HR 57 | Temp 98.5°F | Resp 18 | Ht 65.0 in | Wt 158.0 lb

## 2021-05-14 DIAGNOSIS — G8929 Other chronic pain: Secondary | ICD-10-CM

## 2021-05-14 DIAGNOSIS — N951 Menopausal and female climacteric states: Secondary | ICD-10-CM

## 2021-05-14 DIAGNOSIS — M25531 Pain in right wrist: Secondary | ICD-10-CM

## 2021-05-14 DIAGNOSIS — N888 Other specified noninflammatory disorders of cervix uteri: Secondary | ICD-10-CM

## 2021-05-14 DIAGNOSIS — M65311 Trigger thumb, right thumb: Secondary | ICD-10-CM

## 2021-05-14 DIAGNOSIS — R7303 Prediabetes: Secondary | ICD-10-CM

## 2021-05-14 DIAGNOSIS — N950 Postmenopausal bleeding: Secondary | ICD-10-CM

## 2021-05-14 LAB — POCT GLYCOSYLATED HEMOGLOBIN (HGB A1C): Hemoglobin A1C: 5.8 % — AB (ref 4.0–5.6)

## 2021-05-14 NOTE — Progress Notes (Signed)
Pt presents for prediabetes follow-up states she states that she has been feeling bad

## 2021-05-14 NOTE — Progress Notes (Signed)
Prediabetes discussed in office.

## 2021-05-15 ENCOUNTER — Ambulatory Visit (INDEPENDENT_AMBULATORY_CARE_PROVIDER_SITE_OTHER): Payer: Self-pay | Admitting: Sports Medicine

## 2021-05-15 VITALS — BP 122/80 | Ht 65.0 in | Wt 158.0 lb

## 2021-05-15 DIAGNOSIS — M65311 Trigger thumb, right thumb: Secondary | ICD-10-CM

## 2021-05-15 DIAGNOSIS — M67431 Ganglion, right wrist: Secondary | ICD-10-CM

## 2021-05-15 MED ORDER — METHYLPREDNISOLONE ACETATE 40 MG/ML IJ SUSP
20.0000 mg | Freq: Once | INTRAMUSCULAR | Status: AC
  Administered 2021-05-15: 20 mg via INTRA_ARTICULAR

## 2021-05-15 NOTE — Patient Instructions (Signed)
It was great to see you again today, thank you for letting me participate in your care!  Today, we discussed your trigger finger (thumb) You also have a (volar) ganglion cyst - this does not cause pain and is very small on the ultrasound  Things for you to-do:  -Keep the Band-Aid applied to the thumb for the next 3 days, you may come out of it at that point -Try to not bend the thumb very often over the next couple days until the medicine kicks in -You may use ice and/or Tylenol for any postinjection pain  -Every once in a while, this may return, if this is the case we need to wait at least 1 month but then can consider a second injection.  Hopefully this does not return and gives you pain relief.  You will follow-up as needed.  If you have any further questions, please give the clinic a call [phone].  Cheers,  Elba Barman, DO Sports Medicine Fellow [place]

## 2021-05-15 NOTE — Progress Notes (Signed)
PCP: Camillia Herter, NP  Subjective:   HPI: Patient is a 51 y.o. female here for evaluation of thumb pain.  Patient was seen in the urgent care on 05/13/2021 and noted to have right thumb pain with "triggering" events x 1 month.  She is not taking any medication for this pain.  Her pain is located moreso on the volar aspect of the thumb, which is worse with activity and repetitive gripping which she has had to do in the past for her job.  In the morning and after repetitive activity she will have to pry open the thumb to extend it fully and this is painful.  She also notices a small lump on the volar aspect of the wrist near the wrist joint.  She states this started a few months before the trigger finger, although it does not cause her pain.  Is not red, warm or tender to touch.  Of note, the patient does report she is intolerant to anti-inflammatories with a history of aspirin allergy and gastric ulcer, so unable to take NSAIDs.  Past Medical History:  Diagnosis Date   Anemia    Constipation    COPD (chronic obstructive pulmonary disease) (HCC)    Cough    GERD (gastroesophageal reflux disease)    Headache(784.0)    Migraine    Sore throat    Weakness    Weight loss     Current Outpatient Medications on File Prior to Visit  Medication Sig Dispense Refill   atorvastatin (LIPITOR) 20 MG tablet TAKE 1 TABLET (20 MG TOTAL) BY MOUTH DAILY. 90 tablet 0   methylPREDNISolone (MEDROL DOSEPAK) 4 MG TBPK tablet Take as directed until finished (Patient not taking: No sig reported) 21 each 0   pantoprazole (PROTONIX) 40 MG tablet Take 1 tablet (40 mg total) by mouth as needed. 90 tablet 0   PARoxetine (PAXIL) 10 MG tablet Take 1 tablet (10 mg total) by mouth daily. 90 tablet 0   [DISCONTINUED] fluticasone (FLONASE) 50 MCG/ACT nasal spray Place 2 sprays into both nostrils daily. FOR NASAL CONGESTION 16 g 0   [DISCONTINUED] potassium chloride SA (K-DUR,KLOR-CON) 20 MEQ tablet Take 2 tablets (40  mEq total) by mouth 2 (two) times daily for 7 days. 28 tablet 0   No current facility-administered medications on file prior to visit.    Past Surgical History:  Procedure Laterality Date   LAPAROSCOPIC APPENDECTOMY  03/14/2011   Procedure: APPENDECTOMY LAPAROSCOPIC;  Surgeon: Shann Medal, MD;  Location: [facility];  Service: General;  Laterality: N/A;   TUBAL LIGATION      Allergies  Allergen Reactions   Amoxicillin Nausea And Vomiting   Aspirin Hives   Erythromycin Nausea And Vomiting   Percocet [Oxycodone-Acetaminophen] Nausea And Vomiting   Vicodin [Hydrocodone-Acetaminophen] Nausea And Vomiting    BP 122/80    Ht 5\' 5"  (1.651 m)    Wt 158 lb (71.7 kg)    BMI 26.29 kg/m   No flowsheet data found.  No flowsheet data found.      Objective:  Physical Exam:  Gen: Well-appearing, in no acute distress; non-toxic CV: Regular Rate. Well-perfused. Warm.  Resp: Breathing unlabored on room air; no wheezing. Psych: Fluid speech in conversation; appropriate affect; normal thought process Neuro: Sensation intact throughout. No gross coordination deficits.  MSK:  - Right hand/wrist: + There is a small cystlike formation at the volar aspect of the wrist joint, nontender to palpation.  Positive TTP at the volar base of the  thumb MCP joint with a palpable nodule.  There is a reproducible "triggering" with flexion and extension of the thumb.  Pain with coming out of close fist with triggering episode.  Full range of motion about the wrist.  5/5 strength with wrist flexion and extension. Neurovascularly intact distally.     Assessment & Plan:  1. Trigger finger, right thumb 2. Volar ganglion cyst, right wrist  Procedure: Trigger finger Injection, Right thumb finger After discussion on R/B/I and informed written consent, the area of tenderness at the A1 pulley was identified with palpable nodule located and marked. This area was cleaned with Betadine swab and alcohol swabs. After sterile  precautions were taken, a 25-gauge, 5/8" needle was inserted into the A1 pulley with 0.5cc:0.5cc mixture of Lidocaine 1% and Methylprednisolone 40mg /mL. Patient tolerated procedure well and was observed for at least 5 minutes after injection with resolution of pain and improved ROM.  Plan: - education and reassurance provided on ganglion cyst, will simply observe at this time - Trigger finger (thumb) CS injection provided today - applied double band-aid to IP of thumb to prevent flexion, to remain for next 2-3 days until CS kicks in - may use ice and/or tylenol for any post-injection pain - Discussed activity modification and rest for the next few days until her pain and triggering improved -Discussed with the patient that most of the time a one-time injection will completely resolve the triggering, although occasionally may need a second injection months later if returns. She may follow-up on an as-needed basis.  Elba Barman, DO PGY-4, Sports Medicine Fellow [place]  Addendum:  I was the preceptor for this visit and available for immediate consultation.  Karlton Lemon MD Kirt Boys

## 2021-06-07 ENCOUNTER — Ambulatory Visit: Payer: Self-pay | Attending: Family

## 2021-06-07 ENCOUNTER — Other Ambulatory Visit: Payer: Self-pay

## 2021-06-19 ENCOUNTER — Telehealth: Payer: Self-pay | Admitting: Family

## 2021-06-19 NOTE — Telephone Encounter (Signed)
Pt was sent a letter from financial dept. Inform them, that the application they submitted was incomplete, since they were missing some documentation at the time of the appointment, Pt need to reschedule and resubmit all new papers and application for CAFA and OC, P.S. old documents has been sent back by mail to the Pt and Pt. need to make a new appt. 

## 2021-08-30 ENCOUNTER — Encounter: Payer: Self-pay | Admitting: Gastroenterology

## 2021-09-02 ENCOUNTER — Emergency Department (HOSPITAL_COMMUNITY): Payer: Self-pay

## 2021-09-02 ENCOUNTER — Emergency Department (HOSPITAL_COMMUNITY)
Admission: EM | Admit: 2021-09-02 | Discharge: 2021-09-02 | Disposition: A | Discharge status: Home / Self Care | Payer: Self-pay | Attending: Emergency Medicine | Admitting: Emergency Medicine

## 2021-09-02 ENCOUNTER — Encounter (HOSPITAL_COMMUNITY): Payer: Self-pay | Admitting: Emergency Medicine

## 2021-09-02 ENCOUNTER — Other Ambulatory Visit: Payer: Self-pay

## 2021-09-02 DIAGNOSIS — F17200 Nicotine dependence, unspecified, uncomplicated: Secondary | ICD-10-CM | POA: Insufficient documentation

## 2021-09-02 DIAGNOSIS — Z20822 Contact with and (suspected) exposure to covid-19: Secondary | ICD-10-CM | POA: Insufficient documentation

## 2021-09-02 DIAGNOSIS — J069 Acute upper respiratory infection, unspecified: Secondary | ICD-10-CM | POA: Insufficient documentation

## 2021-09-02 DIAGNOSIS — Z7951 Long term (current) use of inhaled steroids: Secondary | ICD-10-CM | POA: Insufficient documentation

## 2021-09-02 DIAGNOSIS — J449 Chronic obstructive pulmonary disease, unspecified: Secondary | ICD-10-CM | POA: Insufficient documentation

## 2021-09-02 DIAGNOSIS — R0789 Other chest pain: Secondary | ICD-10-CM | POA: Insufficient documentation

## 2021-09-02 LAB — BASIC METABOLIC PANEL
Anion gap: 6 (ref 5–15)
BUN: 6 mg/dL (ref 6–20)
CO2: 26 mmol/L (ref 22–32)
Calcium: 9.3 mg/dL (ref 8.9–10.3)
Chloride: 110 mmol/L (ref 98–111)
Creatinine, Ser: 0.83 mg/dL (ref 0.44–1.00)
GFR, Estimated: >60 mL/min (ref >60)
Glucose, Bld: 90 mg/dL (ref 70–99)
Potassium: 3.4 mmol/L — ABNORMAL LOW (ref 3.5–5.1)
Sodium: 142 mmol/L (ref 135–145)

## 2021-09-02 LAB — TROPONIN I (HIGH SENSITIVITY): Troponin I (High Sensitivity): 3 ng/L (ref <18)

## 2021-09-02 LAB — CBC
HCT: 40.3 % (ref 36.0–46.0)
Hemoglobin: 12.8 g/dL (ref 12.0–15.0)
MCH: 28.5 pg (ref 26.0–34.0)
MCHC: 31.8 g/dL (ref 30.0–36.0)
MCV: 89.8 fL (ref 80.0–100.0)
Platelets: 217 10*3/uL (ref 150–400)
RBC: 4.49 MIL/uL (ref 3.87–5.11)
RDW: 13.8 % (ref 11.5–15.5)
WBC: 7.4 10*3/uL (ref 4.0–10.5)
nRBC: 0 % (ref 0.0–0.2)

## 2021-09-02 LAB — RESP PANEL BY RT-PCR (FLU A&B, COVID) ARPGX2
Influenza A by PCR: NEGATIVE
Influenza B by PCR: NEGATIVE
SARS Coronavirus 2 by RT PCR: NEGATIVE

## 2021-09-02 LAB — D-DIMER, QUANTITATIVE: D-Dimer, Quant: <0.27 ug/mL-FEU (ref 0.00–0.50)

## 2021-09-02 MED ORDER — ALBUTEROL SULFATE (2.5 MG/3ML) 0.083% IN NEBU
5.0000 mg | INHALATION_SOLUTION | Freq: Once | RESPIRATORY_TRACT | Status: AC
  Administered 2021-09-02: 5 mg via RESPIRATORY_TRACT
  Filled 2021-09-02: qty 6

## 2021-09-02 MED ORDER — KETOROLAC TROMETHAMINE 15 MG/ML IJ SOLN
15.0000 mg | Freq: Once | INTRAMUSCULAR | Status: AC
  Administered 2021-09-02: 15 mg via INTRAVENOUS
  Filled 2021-09-02: qty 1

## 2021-09-02 MED ORDER — ALBUTEROL SULFATE HFA 108 (90 BASE) MCG/ACT IN AERS
2.0000 | INHALATION_SPRAY | RESPIRATORY_TRACT | Status: DC | PRN
  Administered 2021-09-02: 2 via RESPIRATORY_TRACT
  Filled 2021-09-02: qty 6.7

## 2021-09-02 MED ORDER — PREDNISONE 20 MG PO TABS
40.0000 mg | ORAL_TABLET | Freq: Every day | ORAL | 0 refills | Status: DC
  Filled 2021-09-02: qty 6, 3d supply, fill #0

## 2021-09-02 MED ORDER — IPRATROPIUM-ALBUTEROL 0.5-2.5 (3) MG/3ML IN SOLN: 3.0000 mL | Freq: Three times a day (TID) | RESPIRATORY_TRACT | Status: DC

## 2021-09-02 MED ORDER — IPRATROPIUM BROMIDE 0.02 % IN SOLN
0.5000 mg | Freq: Once | RESPIRATORY_TRACT | Status: AC
  Administered 2021-09-02: 0.5 mg via RESPIRATORY_TRACT
  Filled 2021-09-02: qty 2.5

## 2021-09-02 MED ORDER — GUAIFENESIN-DM 100-10 MG/5ML PO SYRP: 5.0000 mL | ORAL_SOLUTION | Freq: Three times a day (TID) | ORAL | 0 refills | Status: DC | PRN

## 2021-09-02 NOTE — ED Triage Notes (Signed)
Pt states that she has been SOB starting today, has been sneezing and coughing since Thursday night. Also endorses central chest tightness. H/o COPD, continues to smoke. Works at a medical facility.

## 2021-09-02 NOTE — ED Provider Notes (Signed)
[facility] EMERGENCY DEPARTMENT Provider Note   CSN: [id number] Arrival date & time: 09/02/21  0654     History  Chief Complaint  Patient presents with   Chest Pain   Shortness of Breath    Nicole Cisneros is a 51 y.o. female.   Chest Pain Associated symptoms: shortness of breath   Shortness of Breath Associated symptoms: chest pain   Patient presents with anterior chest pain.  Has had episodes since Saturday with today being Monday.  States she has been coughing.  States last night pain developed much more intense.  Anterior chest.  Worse with breathing.  States she does feel very short of breath.  Has had a cough with no real sputum production.  States she does feel congested.  Has not been around anyone sick.  No swelling in her legs.  She is a smoker.  States has been diagnosed with COPD in the past.   Past Medical History:  Diagnosis Date   Anemia    Constipation    COPD (chronic obstructive pulmonary disease) (HCC)    Cough    GERD (gastroesophageal reflux disease)    Headache(784.0)    Migraine    Sore throat    Weakness    Weight loss     Home Medications Prior to Admission medications   Medication Sig Start Date End Date Taking? Authorizing Provider  guaiFENesin-dextromethorphan (ROBITUSSIN DM) 100-10 MG/5ML syrup Take 5 mLs by mouth 3 (three) times daily as needed for cough. 09/02/21  Yes Davonna Belling, MD  predniSONE (DELTASONE) 20 MG tablet Take 2 tablets (40 mg total) by mouth daily. 09/02/21  Yes Davonna Belling, MD  atorvastatin (LIPITOR) 20 MG tablet TAKE 1 TABLET (20 MG TOTAL) BY MOUTH DAILY. 11/09/20 11/09/21  Camillia Herter, NP  pantoprazole (PROTONIX) 40 MG tablet Take 1 tablet (40 mg total) by mouth as needed. 11/09/20 02/07/21  Camillia Herter, NP  PARoxetine (PAXIL) 10 MG tablet Take 1 tablet (10 mg total) by mouth daily. 11/09/20 02/07/21  Camillia Herter, NP  fluticasone (FLONASE) 50 MCG/ACT nasal spray Place 2 sprays into  both nostrils daily. FOR NASAL CONGESTION 06/30/17 03/21/19  Kinnie Feil, PA-C  potassium chloride SA (K-DUR,KLOR-CON) 20 MEQ tablet Take 2 tablets (40 mEq total) by mouth 2 (two) times daily for 7 days. 06/30/17 08/23/19  Kinnie Feil, PA-C      Allergies    Amoxicillin, Aspirin, Erythromycin, Percocet [oxycodone-acetaminophen], and Vicodin [hydrocodone-acetaminophen]    Review of Systems   Review of Systems  Respiratory:  Positive for shortness of breath.   Cardiovascular:  Positive for chest pain.   Physical Exam Updated Vital Signs BP 128/87   Pulse 69   Temp 98.2 F (36.8 C)   Resp 16   SpO2 97%  Physical Exam Vitals and nursing note reviewed.  Cardiovascular:     Rate and Rhythm: Normal rate and regular rhythm.  Pulmonary:     Breath sounds: No decreased breath sounds or wheezing.  Chest:     Chest wall: Tenderness present.     Comments: Tenderness to anterior chest.  Somewhat tachypneic. Musculoskeletal:     Right lower leg: No edema.     Left lower leg: No edema.  Skin:    General: Skin is warm.  Neurological:     Mental Status: She is alert.    ED Results / Procedures / Treatments   Labs (all labs ordered are listed, but only abnormal results are  displayed) Labs Reviewed  BASIC METABOLIC PANEL - Abnormal; Notable for the following components:      Result Value   Potassium 3.4 (*)    All other components within normal limits  RESP PANEL BY RT-PCR (FLU A&B, COVID) ARPGX2  CBC  D-DIMER, QUANTITATIVE  TROPONIN I (HIGH SENSITIVITY)    EKG EKG Interpretation  Date/Time:  Monday Sep 02 2021 07:00:32 EDT Ventricular Rate:  88 PR Interval:  156 QRS Duration: 76 QT Interval:  372 QTC Calculation: 450 R Axis:   67 Text Interpretation: Normal sinus rhythm Right atrial enlargement Borderline ECG When compared with ECG of 21-Apr-2020 17:05, lateral ST/T wave changes improved Confirmed by Davonna Belling [phone]) on 09/02/2021 7:26:56  AM  Radiology DG Chest 2 View  Result Date: 09/02/2021 CLINICAL DATA:  51 year old female with shortness of breath, nonproductive cough for 2 days. History of COPD. EXAM: CHEST - 2 VIEW COMPARISON:  Chest radiographs 06/06/2018 and earlier. FINDINGS: Lung volumes are chronically at the upper limits of normal to mildly hyperinflated, stable since 2018. Normal cardiac size and mediastinal contours. Visualized tracheal air column is within normal limits. No pneumothorax, pulmonary edema, pleural effusion or confluent pulmonary opacity. Lung markings appear stable. No acute osseous abnormality identified. Negative visible bowel gas pattern. IMPRESSION: No acute cardiopulmonary abnormality. Electronically Signed   By: Genevie Ann M.D.   On: 09/02/2021 07:20    Procedures Procedures    Medications Ordered in ED Medications  ketorolac (TORADOL) 15 MG/ML injection 15 mg (15 mg Intravenous Given 09/02/21 0802)  albuterol (PROVENTIL) (2.5 MG/3ML) 0.083% nebulizer solution 5 mg (5 mg Nebulization Given 09/02/21 0739)  ipratropium (ATROVENT) nebulizer solution 0.5 mg (0.5 mg Nebulization Given 09/02/21 0739)    ED Course/ Medical Decision Making/ A&P                           Medical Decision Making Amount and/or Complexity of Data Reviewed Labs: ordered.  Risk OTC drugs. Prescription drug management.  Patient anterior chest pain and shortness of breath.  History of same.  Has had a little bit of a cough.  Pain is pleuritic.  COPD history.  Differential diagnosis includes URI, cardiac or even pleuritic chest pain/pulm embolism.  Chest x-ray reviewed and reassuring.  Independently interpreted by me.  D-dimer done and was negative and helps rule out pulm embolism.  Doubt cardiac ischemia.  EKG reassuring and troponin negative.  Feels better after albuterol.  Not hypoxic.  Will treat with short course of steroids and antitussives.  Discharge home with outpatient follow-up as needed       Final Clinical  Impression(s) / ED Diagnoses Final diagnoses:  Atypical chest pain  Upper respiratory tract infection, unspecified type    Rx / DC Orders ED Discharge Orders          Ordered    guaiFENesin-dextromethorphan (ROBITUSSIN DM) 100-10 MG/5ML syrup  3 times daily PRN        09/02/21 0954    predniSONE (DELTASONE) 20 MG tablet  Daily        09/02/21 0954              Davonna Belling, MD 09/02/21 [phone]

## 2021-09-03 ENCOUNTER — Other Ambulatory Visit: Payer: Self-pay

## 2021-12-11 ENCOUNTER — Other Ambulatory Visit: Payer: Self-pay

## 2021-12-11 DIAGNOSIS — Z1231 Encounter for screening mammogram for malignant neoplasm of breast: Secondary | ICD-10-CM

## 2021-12-11 NOTE — Addendum Note (Signed)
Addended by: Demetrius Revel on: 12/11/2021 01:18 PM   Modules accepted: Orders

## 2022-01-08 ENCOUNTER — Other Ambulatory Visit: Payer: Self-pay

## 2022-01-08 ENCOUNTER — Emergency Department (HOSPITAL_COMMUNITY)
Admission: EM | Admit: 2022-01-08 | Discharge: 2022-01-09 | Disposition: A | Discharge status: Home / Self Care | Payer: Self-pay | Attending: Emergency Medicine | Admitting: Emergency Medicine

## 2022-01-08 ENCOUNTER — Encounter (HOSPITAL_COMMUNITY): Payer: Self-pay | Admitting: Emergency Medicine

## 2022-01-08 DIAGNOSIS — L03116 Cellulitis of left lower limb: Secondary | ICD-10-CM | POA: Insufficient documentation

## 2022-01-08 DIAGNOSIS — N9489 Other specified conditions associated with female genital organs and menstrual cycle: Secondary | ICD-10-CM | POA: Insufficient documentation

## 2022-01-08 MED ORDER — ACETAMINOPHEN 500 MG PO TABS
1000.0000 mg | ORAL_TABLET | Freq: Once | ORAL | Status: AC
  Administered 2022-01-08: 1000 mg via ORAL
  Filled 2022-01-08: qty 2

## 2022-01-08 NOTE — ED Provider Triage Note (Signed)
Emergency Medicine Provider Triage Evaluation Note  Nicole Cisneros , a 51 y.o. female  was evaluated in triage.  Pt complains of left foot pain, redness, swelling after reported multiple bites to left foot.  She states that [place] when she felt something biting her foot in multiple places.  Looked down and did not see any spiders or snakes.  Initially ignored it, however this morning woke up with progressively worsening redness and pain.  No fevers or chills.  Review of Systems  Positive: As above Negative: Fevers chills  Physical Exam  BP 113/63   Pulse 66   Temp 97.9 F (36.6 C) (Oral)   Resp 18   SpO2 98%  Gen:   Awake, no distress   Resp:  Normal effort  MSK:   Moves extremities without difficulty  Other:  Multiple small erythematous papules diffusely across the left foot, dorsum of the foot and ankle generally erythematous and tender to palpation with soft tissue edema significant compared to the right.  Normal capillary refill in all digits, but unable to palpate pedal pulses secondary to soft tissue swelling.  Pulses identified with Doppler  Medical Decision Making  Medically screening exam initiated at 11:43 PM.  Appropriate orders placed.  Nicole Cisneros was informed that the remainder of the evaluation will be completed by another provider, this initial triage assessment does not replace that evaluation, and the importance of remaining in the ED until their evaluation is complete.  This chart was dictated using voice recognition software, Dragon. Despite the best efforts of this provider to proofread and correct errors, errors may still occur which can change documentation meaning.    Emeline Darling, PA-C 01/09/22 0023

## 2022-01-08 NOTE — ED Provider Triage Note (Incomplete)
Emergency Medicine Provider Triage Evaluation Note  Nicole Cisneros , a 51 y.o. female  was evaluated in triage.  Pt complains of left foot pain, redness, swelling after reported multiple bites to left foot.  She states that [place] when she felt something biting her foot in multiple places.  Looked down and did not see any spiders or snakes.  Initially ignored it, however this morning woke up with progressively worsening redness and pain.  No fevers or chills.  Review of Systems  Positive: As above Negative: Fevers chills  Physical Exam  BP 113/63   Pulse 66   Temp 97.9 F (36.6 C) (Oral)   Resp 18   SpO2 98%  Gen:   Awake, no distress   Resp:  Normal effort  MSK:   Moves extremities without difficulty  Other:  Multiple small erythematous papules diffusely across the left foot, dorsum of the foot and ankle generally erythematous and tender to palpation with soft tissue edema significant compared to the right.  Normal capillary refill in all digits, but unable to palpate pedal pulses secondary to soft tissue swelling.  Pulses identified with Doppler***  Medical Decision Making  Medically screening exam initiated at 11:43 PM.  Appropriate orders placed.  Nicole Cisneros was informed that the remainder of the evaluation will be completed by another provider, this initial triage assessment does not replace that evaluation, and the importance of remaining in the ED until their evaluation is complete.  This chart was dictated using voice recognition software, Dragon. Despite the best efforts of this provider to proofread and correct errors, errors may still occur which can change documentation meaning.

## 2022-01-08 NOTE — ED Triage Notes (Signed)
Patient states she was at a funeral yesterday, got out of her car and felt like something bit her left foot causing pain and noticed feet swelling last night as well. Patient states she noticed 2 marks on the top of the foot that looks like bite marks. Patient did not see anything bite her. Aox4.

## 2022-01-09 ENCOUNTER — Other Ambulatory Visit: Payer: Self-pay

## 2022-01-09 ENCOUNTER — Emergency Department (HOSPITAL_COMMUNITY): Payer: Self-pay

## 2022-01-09 LAB — BASIC METABOLIC PANEL
Anion gap: 6 (ref 5–15)
BUN: 10 mg/dL (ref 6–20)
CO2: 27 mmol/L (ref 22–32)
Calcium: 9.5 mg/dL (ref 8.9–10.3)
Chloride: 106 mmol/L (ref 98–111)
Creatinine, Ser: 0.67 mg/dL (ref 0.44–1.00)
GFR, Estimated: >60 mL/min (ref >60)
Glucose, Bld: 99 mg/dL (ref 70–99)
Potassium: 3.5 mmol/L (ref 3.5–5.1)
Sodium: 139 mmol/L (ref 135–145)

## 2022-01-09 LAB — CBC WITH DIFFERENTIAL/PLATELET
Abs Immature Granulocytes: 0.09 10*3/uL — ABNORMAL HIGH (ref 0.00–0.07)
Basophils Absolute: 0 10*3/uL (ref 0.0–0.1)
Basophils Relative: 1 %
Eosinophils Absolute: 0.2 10*3/uL (ref 0.0–0.5)
Eosinophils Relative: 3 %
HCT: 36 % (ref 36.0–46.0)
Hemoglobin: 11.9 g/dL — ABNORMAL LOW (ref 12.0–15.0)
Immature Granulocytes: 1 %
Lymphocytes Relative: 24 %
Lymphs Abs: 2 10*3/uL (ref 0.7–4.0)
MCH: 28.7 pg (ref 26.0–34.0)
MCHC: 33.1 g/dL (ref 30.0–36.0)
MCV: 86.7 fL (ref 80.0–100.0)
Monocytes Absolute: 0.6 10*3/uL (ref 0.1–1.0)
Monocytes Relative: 8 %
Neutro Abs: 5.2 10*3/uL (ref 1.7–7.7)
Neutrophils Relative %: 63 %
Platelets: 257 10*3/uL (ref 150–400)
RBC: 4.15 MIL/uL (ref 3.87–5.11)
RDW: 14.4 % (ref 11.5–15.5)
WBC: 8.1 10*3/uL (ref 4.0–10.5)
nRBC: 0 % (ref 0.0–0.2)

## 2022-01-09 LAB — I-STAT BETA HCG BLOOD, ED (MC, WL, AP ONLY): I-stat hCG, quantitative: <5 m[IU]/mL (ref <5)

## 2022-01-09 MED ORDER — DOXYCYCLINE HYCLATE 100 MG PO TABS
100.0000 mg | ORAL_TABLET | Freq: Two times a day (BID) | ORAL | 0 refills | Status: DC
  Filled 2022-01-09: qty 20, 10d supply, fill #0

## 2022-01-09 MED ORDER — KETOROLAC TROMETHAMINE 10 MG PO TABS
10.0000 mg | ORAL_TABLET | Freq: Four times a day (QID) | ORAL | 0 refills | Status: DC | PRN
Start: 2022-01-09 — End: 2022-11-25
  Filled 2022-01-09: qty 20, 5d supply, fill #0

## 2022-01-09 NOTE — ED Provider Notes (Signed)
11:59 AM Patient seen in conjunction with Small PA-C. Pt with several small pustules and mild generalized erythema over the dorsum of the foot and ankle.  She was in some grass at a cemetary and felt some insects biting her ankles bilaterally.  She initially just had significant itching.  However pain and swelling worsened on the left ankle and foot.  Consistent with allergic reaction with mild secondary cellulitis.  Will be treated with doxycycline.  No signs of sepsis.  No signs of inflammatory arthritis such as gout or septic arthritis.  Patient has had itching and some increasing pain but is ambulatory.  She looks well, nontoxic.  Exam:  Gen NAD; Heart RRR, nml S1,S2, no m/r/g; Lungs CTAB; Abd soft, NT, no rebound or guarding; Ext 2+ pedal pulses bilaterally, soft tissue swelling of the left foot with 3-4 scattered pustules, mild erythema and warmth consistent with mild cellulitis, normal range of motion.  BP (!) 137/91 (BP Location: Right Arm)   Pulse (!) 58   Temp (!) 97.5 F (36.4 C) (Oral)   Resp 17   SpO2 100%     Carlisle Cater, PA-C 01/09/22 1201    Fransico Meadow, MD 01/10/22 [phone]

## 2022-01-09 NOTE — Discharge Instructions (Addendum)
Please follow-up with your primary care provider.  If redness, swelling, warmth does not improve in the next 48 hours please seek medical care.  If you develop fever, chills, worsening pain, or your foot turns a different color please return to the ER.

## 2022-01-09 NOTE — ED Provider Notes (Signed)
[place] EMERGENCY DEPARTMENT Provider Note   CSN: [id number] Arrival date & time: 01/08/22  2242     History  Chief Complaint  Patient presents with   Foot Pain   Foot Swelling    Nicole Cisneros is a 51 y.o. female, no pertinent past medical history, who presents to the ED secondary to left foot pain for the past 2 days.  She states she was at a funeral 2 days ago, when she felt something biting on her foot, she looked and did not see anything, believes it was a spider, bee, or insect.  States that she did not notice anything initially, except for itching, then it began to swell.  Was extremely swollen yesterday, noticed a pustule, which she attempted to scratch.  Woke up this morning and it was much more swollen, and hot to the touch.  She denies any fevers, does endorse chills however.  Has tried Epsom salt bath, Tylenol without any relief. Denies recent falls.      Home Medications Prior to Admission medications   Medication Sig Start Date End Date Taking? Authorizing Provider  doxycycline (VIBRAMYCIN) 100 MG capsule Take 1 capsule (100 mg total) by mouth 2 (two) times daily. 01/09/22  Yes Kaydon Creedon L, PA  ketorolac (TORADOL) 10 MG tablet Take 1 tablet (10 mg total) by mouth every 6 (six) hours as needed. 01/09/22  Yes Hyde Sires L, PA  atorvastatin (LIPITOR) 20 MG tablet TAKE 1 TABLET (20 MG TOTAL) BY MOUTH DAILY. 11/09/20 11/09/21  Camillia Herter, NP  guaiFENesin-dextromethorphan (ROBITUSSIN DM) 100-10 MG/5ML syrup Take 5 mLs by mouth 3 (three) times daily as needed for cough. 09/02/21   Davonna Belling, MD  pantoprazole (PROTONIX) 40 MG tablet Take 1 tablet (40 mg total) by mouth as needed. 11/09/20 02/07/21  Camillia Herter, NP  PARoxetine (PAXIL) 10 MG tablet Take 1 tablet (10 mg total) by mouth daily. 11/09/20 02/07/21  Camillia Herter, NP  predniSONE (DELTASONE) 20 MG tablet Take 2 tablets (40 mg total) by mouth daily. 09/02/21   Davonna Belling, MD   fluticasone (FLONASE) 50 MCG/ACT nasal spray Place 2 sprays into both nostrils daily. FOR NASAL CONGESTION 06/30/17 03/21/19  Kinnie Feil, PA-C  potassium chloride SA (K-DUR,KLOR-CON) 20 MEQ tablet Take 2 tablets (40 mEq total) by mouth 2 (two) times daily for 7 days. 06/30/17 08/23/19  Kinnie Feil, PA-C      Allergies    Amoxicillin, Aspirin, Erythromycin, Percocet [oxycodone-acetaminophen], and Vicodin [hydrocodone-acetaminophen]    Review of Systems   Review of Systems  Constitutional:  Positive for chills. Negative for fever.  Skin:  Positive for rash.    Physical Exam Updated Vital Signs BP (!) 137/91 (BP Location: Right Arm)   Pulse (!) 58   Temp (!) 97.5 F (36.4 C) (Oral)   Resp 17   SpO2 100%  Physical Exam Constitutional:      Appearance: Normal appearance.  HENT:     Head: Normocephalic.     Nose: Nose normal.     Mouth/Throat:     Mouth: Mucous membranes are moist.  Eyes:     Extraocular Movements: Extraocular movements intact.     Pupils: Pupils are equal, round, and reactive to light.  Cardiovascular:     Rate and Rhythm: Normal rate.     Pulses: Normal pulses.  Pulmonary:     Effort: Pulmonary effort is normal.  Skin:    General: Skin is warm.  Capillary Refill: Capillary refill takes less than 2 seconds.     Findings: Erythema present.     Comments: +Towana Stenglein pustule on lateral malleolus and another Tallin Hart pustule on medial foot. Hot to the touch, edematous foot.   Neurological:     General: No focal deficit present.     Mental Status: She is alert.  Psychiatric:        Mood and Affect: Mood normal.     ED Results / Procedures / Treatments   Labs (all labs ordered are listed, but only abnormal results are displayed) Labs Reviewed  CBC WITH DIFFERENTIAL/PLATELET - Abnormal; Notable for the following components:      Result Value   Hemoglobin 11.9 (*)    Abs Immature Granulocytes 0.09 (*)    All other components within normal limits   BASIC METABOLIC PANEL  I-STAT BETA HCG BLOOD, ED ([facility], [facility], [facility] ONLY)    EKG None  Radiology DG Foot Complete Left  Result Date: 01/09/2022 CLINICAL DATA:  Swelling and redness query infection EXAM: LEFT FOOT - COMPLETE 3+ VIEW COMPARISON:  Radiographs 01/15/2021 FINDINGS: No acute fracture or dislocation. No cortical irregularity or destruction to suggest osteomyelitis. Swelling about the dorsum of the foot. IMPRESSION: No acute osseous abnormality. Electronically Signed   By: Placido Sou M.D.   On: 01/09/2022 00:44    Procedures Procedures    Medications Ordered in ED Medications  acetaminophen (TYLENOL) tablet 1,000 mg (1,000 mg Oral Given 01/08/22 2354)    ED Course/ Medical Decision Making/ A&P                           Medical Decision Making Risk Prescription drug management.   This patient presents to the ED for concern of left foot pain   Imaging Studies ordered:  I ordered imaging studies including xray of left foot which illustrated dorsal soft tissue swelling  Test / Admission - Considered:  Patient is a 51 year old female, no pertinent past medical history, here for swelling of her left foot, with itching, warmth.  Was bit by something 2 days ago.  Developed pustules as well on ankle/foot.  Has tried Epson salts, without relief.  Found to have diffuse soft tissue swelling of the left foot, with erythema, and diffuse tenderness.  Discussed possible cellulitis, versus allergic reaction.  Discharged with doxycycline, recommend Benadryl for swelling.  Peripheral pulses present.  No bony tenderness.  Able to bear weight on foot.  Return symptoms emphasized.  No fevers.  Well-appearing.  Final Clinical Impression(s) / ED Diagnoses Final diagnoses:  Cellulitis of left foot    Rx / DC Orders ED Discharge Orders          Ordered    ketorolac (TORADOL) 10 MG tablet  Every 6 hours PRN        01/09/22 1150    doxycycline (VIBRAMYCIN) 100 MG capsule  2 times daily         01/09/22 1150              Munirah Doerner, [place], [place] 01/09/22 1201    Fransico Meadow, MD 01/10/22 [phone]

## 2022-01-30 ENCOUNTER — Other Ambulatory Visit: Payer: Self-pay

## 2022-01-30 ENCOUNTER — Ambulatory Visit: Payer: Self-pay

## 2022-01-30 ENCOUNTER — Ambulatory Visit: Payer: Self-pay | Admitting: Hematology and Oncology

## 2022-01-30 VITALS — BP 118/78 | Wt 144.0 lb

## 2022-01-30 DIAGNOSIS — N644 Mastodynia: Secondary | ICD-10-CM

## 2022-01-30 NOTE — Progress Notes (Signed)
[name] is a 51 y.o. female who presents to [facility] clinic today with complaint of bilateral breast pain.    Pap Smear: Pap not smear completed today. Last Pap smear was 11/08/20 at [facility] and was normal. Per patient has no history of an abnormal Pap smear. Last Pap smear result is available in Epic.   Physical exam: Breasts Breasts symmetrical. No skin abnormalities bilateral breasts. No nipple retraction bilateral breasts. No nipple discharge bilateral breasts. No lymphadenopathy. No lumps palpated bilateral breasts.     MM CLIP PLACEMENT RIGHT  Result Date: 11/13/2020 CLINICAL DATA:  Status post ultrasound guided biopsy of the right breast. EXAM: 3D DIAGNOSTIC RIGHT MAMMOGRAM POST ULTRASOUND BIOPSY COMPARISON:  Previous exam(s). FINDINGS: 3D Mammographic images were obtained following ultrasound guided biopsy of a right breast mass. The biopsy marking clip is in expected position at the site of biopsy. IMPRESSION: Appropriate positioning of the ribbon shaped biopsy marking clip at the site of biopsy in the upper-outer right breast. Final Assessment: Post Procedure Mammograms for Marker Placement Electronically Signed   By: Kristopher Oppenheim M.D.   On: 11/13/2020 14:32  MS DIGITAL DIAG TOMO UNI RIGHT  Result Date: 11/08/2020 CLINICAL DATA:  51 year old female presenting as a recall from screening for possible right breast mass. Family history of breast cancer in the patient's sister in her 7s. EXAM: DIGITAL DIAGNOSTIC UNILATERAL RIGHT MAMMOGRAM WITH TOMOSYNTHESIS AND CAD; ULTRASOUND RIGHT BREAST LIMITED TECHNIQUE: Right digital diagnostic mammography and breast tomosynthesis was performed. The images were evaluated with computer-aided detection.; Targeted ultrasound examination of the right breast was performed COMPARISON:  Previous exam(s). ACR Breast Density Category b: There are scattered areas of fibroglandular density. FINDINGS: Mammogram: Spot compression tomosynthesis cc and full  field mL tomosynthesis views of the right breast were performed. There is suggestion of an obscured mass measuring approximately 1 cm in the outer right breast anterior depth. Ultrasound: Targeted ultrasound performed in the right breast at 9 o'clock 1 cm from the nipple demonstrating an oval circumscribed hypoechoic mass measuring 1.4 x 0.8 x 1.0 cm, possibly a fibroadenoma. Targeted ultrasound the right axilla demonstrates normal lymph nodes. IMPRESSION: Probably benign mass in the right breast at 9 o'clock, likely a fibroadenoma. RECOMMENDATION: Ultrasound-guided core needle biopsy of the right breast mass at 9 o'clock. Discussed with patient the options of short-term follow-up versus biopsy. The patient will be scheduled for the biopsy appointment prior to leaving the office today. BI-RADS CATEGORY  3: Probably benign. Electronically Signed   By: Audie Pinto M.D.   On: 11/08/2020 14:42  MS DIGITAL SCREENING TOMO BILATERAL  Result Date: 10/17/2020 CLINICAL DATA:  Screening. EXAM: DIGITAL SCREENING BILATERAL MAMMOGRAM WITH TOMOSYNTHESIS AND CAD TECHNIQUE: Bilateral screening digital craniocaudal and mediolateral oblique mammograms were obtained. Bilateral screening digital breast tomosynthesis was performed. The images were evaluated with computer-aided detection. COMPARISON:  Previous exam(s). ACR Breast Density Category b: There are scattered areas of fibroglandular density. FINDINGS: In the right breast, a possible mass warrants further evaluation. This possible mass is seen within the outer RIGHT breast, anterior to middle depth, cc view only, slice 43. In the left breast, no findings suspicious for malignancy. IMPRESSION: Further evaluation is suggested for a possible mass in the right breast. RECOMMENDATION: Diagnostic mammogram and possibly ultrasound of the right breast. (Code:[id number]) The patient will be contacted regarding the findings, and additional imaging will be scheduled. BI-RADS CATEGORY   0: Incomplete. Need additional imaging evaluation and/or prior mammograms for comparison. Electronically Signed  By: Franki Cabot M.D.   On: 10/17/2020 09:18      Pelvic/Bimanual Pap is not indicated today    Smoking History: Patient has is a current smoker at 1 packs per day and was referred to quit line.    Patient Navigation: Patient education provided. Access to services provided for patient through [facility] program. No interpreter provided. No transportation provided   Colorectal Cancer Screening: Per patient had colonoscopy 2022 with benign results. No complaints today.    Breast and Cervical Cancer Risk Assessment: Patient does not have family history of breast cancer, known genetic mutations, or radiation treatment to the chest before age 61. Patient does not have history of cervical dysplasia, immunocompromised, or DES exposure in-utero.  Risk Assessment   No risk assessment data for the current encounter  Risk Scores       11/08/2020   Last edited by: Royston Bake, CMA   5-year risk: 1.9 %   Lifetime risk: 13.3 %            A: [facility] exam without pap smear Complaint of bilateral breast pain.   P: Referred patient to the [facility] for a diagnostic mammogram. Appointment to be scheduled and patient will be notified.  Dayton Scrape A, NP 01/30/2022 1:04 PM

## 2022-03-13 ENCOUNTER — Ambulatory Visit: Payer: Self-pay

## 2022-03-13 ENCOUNTER — Ambulatory Visit
Admission: RE | Admit: 2022-03-13 | Discharge: 2022-03-13 | Disposition: A | Discharge status: Home / Self Care | Payer: No Typology Code available for payment source | Source: Ambulatory Visit | Attending: Obstetrics and Gynecology | Admitting: Obstetrics and Gynecology

## 2022-03-13 DIAGNOSIS — N644 Mastodynia: Secondary | ICD-10-CM

## 2022-08-01 ENCOUNTER — Emergency Department (HOSPITAL_COMMUNITY)
Admission: EM | Admit: 2022-08-01 | Discharge: 2022-08-01 | Disposition: A | Discharge status: Home / Self Care | Payer: No Typology Code available for payment source | Attending: Emergency Medicine | Admitting: Emergency Medicine

## 2022-08-01 ENCOUNTER — Other Ambulatory Visit: Payer: Self-pay

## 2022-08-01 ENCOUNTER — Encounter (HOSPITAL_COMMUNITY): Payer: Self-pay

## 2022-08-01 DIAGNOSIS — J449 Chronic obstructive pulmonary disease, unspecified: Secondary | ICD-10-CM | POA: Insufficient documentation

## 2022-08-01 DIAGNOSIS — M65311 Trigger thumb, right thumb: Secondary | ICD-10-CM | POA: Insufficient documentation

## 2022-08-01 NOTE — ED Triage Notes (Signed)
Pt to er, pt c/o R hand pain, states that she has a hx of a trigger finger release when she had a similar pain before, pt states that she is here for some pain for the past month, states that the pain has gotten worse recently.  Pt states that it feels like a knot in her hand.

## 2022-08-01 NOTE — ED Provider Notes (Signed)
[place] EMERGENCY DEPARTMENT AT [facility] Provider Note   CSN: [id number] Arrival date & time: 08/01/22  1130     History  Chief Complaint  Patient presents with   Hand Pain    Nicole Cisneros is a 52 y.o. female.  Patient is a 52 year old female with a history of COPD, GERD and migraines who is presenting today with complaint of right thumb pain.  This has happened before to her approximately last year she had to get a steroid injection for a trigger finger but reports they have been okay but in the last few weeks the pain has come back her has started clicking again and causing a lot of pain to shoot into her thumb and wrist.  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  She has not spoke with sports medicine who she saw prior because she could not remember the name of the doctor she had seen.  The history is provided by the patient.  Hand Pain       Home Medications Prior to Admission medications   Medication Sig Start Date End Date Taking? Authorizing Provider  atorvastatin (LIPITOR) 20 MG tablet TAKE 1 TABLET (20 MG TOTAL) BY MOUTH DAILY. 11/09/20 01/30/22  Rema Fendt, NP  doxycycline (VIBRA-TABS) 100 MG tablet Take 1 tablet (100 mg total) by mouth 2 (two) times daily. Patient not taking: Reported on 01/30/2022 01/09/22   Small, Brooke L, PA  guaiFENesin-dextromethorphan (ROBITUSSIN DM) 100-10 MG/5ML syrup Take 5 mLs by mouth 3 (three) times daily as needed for cough. Patient not taking: Reported on 01/30/2022 09/02/21   Benjiman Core, MD  ketorolac (TORADOL) 10 MG tablet Take 1 tablet (10 mg total) by mouth every 6 (six) hours as needed. Patient not taking: Reported on 01/30/2022 01/09/22   Small, Brooke L, PA  predniSONE (DELTASONE) 20 MG tablet Take 2 tablets (40 mg total) by mouth daily. Patient not taking: Reported on 01/30/2022 09/02/21   Benjiman Core, MD  fluticasone [facility]) 50 MCG/ACT nasal spray  Place 2 sprays into both nostrils daily. FOR NASAL CONGESTION 06/30/17 03/21/19  Liberty Handy, PA-C  potassium chloride SA (K-DUR,KLOR-CON) 20 MEQ tablet Take 2 tablets (40 mEq total) by mouth 2 (two) times daily for 7 days. 06/30/17 08/23/19  Liberty Handy, PA-C      Allergies    Amoxicillin, Aspirin, Erythromycin, Percocet [oxycodone-acetaminophen], and Vicodin [hydrocodone-acetaminophen]    Review of Systems   Review of Systems  Physical Exam Updated Vital Signs BP (!) 141/98 (BP Location: Left Arm)   Pulse 78   Temp 97.7 F (36.5 C) (Oral)   Resp 17   Ht  (1.6 m)   Wt 65.8 kg   LMP 03/07/2019   SpO2 99%   BMI 25.69 kg/m  Physical Exam Vitals and nursing note reviewed.  Cardiovascular:     Pulses: Normal pulses.  Musculoskeletal:        General: Tenderness present.       Hands:  Skin:    General: Skin is warm and dry.  Neurological:     Mental Status: Mental status is at baseline.     ED Results / Procedures / Treatments   Labs (all labs ordered are listed, but only abnormal results are displayed) Labs Reviewed - No data to display  EKG None  Radiology No results found.  Procedures Procedures    Medications Ordered in ED Medications - No data to display  ED Course/ Medical Decision Making/ A&P                             Medical Decision Making  Patient presenting today with a trigger finger.  Similar issues about a year ago that required a steroid injection.  No new trauma.  Patient had films done last year which were negative.  She does have a ganglion cyst but it is nontender at this time.  Discussed with patient following up with sports medicine who she had seen prior for repeat injection or further intervention.  She was placed in a brace at this time to help for pain.        Final Clinical Impression(s) / ED Diagnoses Final diagnoses:  Trigger finger of right thumb    Rx / DC Orders ED Discharge Orders     None          Gwyneth Sprout, MD 08/01/22 1217

## 2022-08-01 NOTE — Discharge Instructions (Signed)
Wear the brace regularly to help with pain.  You can take it off to bathe.  Make sure you call sports medicine to follow-up so they can do something to hopefully fix this more permanently but the brace will help in the meantime.

## 2022-08-04 ENCOUNTER — Encounter: Payer: Self-pay | Admitting: Sports Medicine

## 2022-08-04 ENCOUNTER — Ambulatory Visit (INDEPENDENT_AMBULATORY_CARE_PROVIDER_SITE_OTHER): Payer: Self-pay | Admitting: Sports Medicine

## 2022-08-04 DIAGNOSIS — M65311 Trigger thumb, right thumb: Secondary | ICD-10-CM

## 2022-08-04 MED ORDER — BETAMETHASONE SOD PHOS & ACET 6 (3-3) MG/ML IJ SUSP
6.0000 mg | INTRAMUSCULAR | Status: AC | PRN
  Administered 2022-08-04: 6 mg via INTRA_ARTICULAR

## 2022-08-04 MED ORDER — LIDOCAINE HCL 1 % IJ SOLN
1.0000 mL | INTRAMUSCULAR | Status: AC | PRN
  Administered 2022-08-04: 1 mL

## 2022-08-04 NOTE — Progress Notes (Signed)
Right thumb pain; triggering. Went to ER and was given a brace  States it is painful and swollen

## 2022-08-04 NOTE — Progress Notes (Signed)
Nicole Cisneros - 52 y.o. female MRN [id number]  Date of birth: Jan 28, 1971  Office Visit Note: Visit Date: 08/04/2022 PCP: Rema Fendt, NP Referred by: Rema Fendt, NP  Subjective: Chief Complaint  Patient presents with   Right Hand - Pain   HPI: Nicole Cisneros is a pleasant 52 y.o. female who presents today for right trigger thumb, acute on chronic.  She first had trigger thumb back in 2023.  I did see her at the sports medicine office on 05/15/2021 and did proceed with A1 trigger thumb injection.  She had complete resolution of her triggering events until a few weeks ago.  Recently seen in the ED/UC for this as well.  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  Pertinent ROS were reviewed with the patient and found to be negative unless otherwise specified above in HPI.   Assessment & Plan: Visit Diagnoses:  1. Trigger thumb, right thumb    Plan: Discussed all treatment options for the trigger thumb including oral medications, injection, bracing and surgical release.  Through shared decision-making, did elect to proceed with A1 trigger thumb injection, patient tolerated well.  Band-Aid splint was applied she is to remain in this for the next 48 to 72 hours to try to minimize bending the thumb to irritate.  It may make sense to get her more into a thumb spica or a [facility] brace, we will consider this when she has insurance coverage as she is currently uninsured.  Would like to have her follow-up in 1 month if she has not had 100% resolution of her symptoms.  May use ice and over-the-counter anti-inflammatories to take as needed.  Follow-up: Return in about 4 weeks (around 09/01/2022) for f/u after obtaining insurance coverage - please give her [facility] phone #.   Meds & Orders: No orders of the defined types were placed in this encounter.   Orders Placed This Encounter  Procedures   Hand/UE Inj     Procedures: Hand/UE Inj: R thumb A1 for  trigger finger on 08/04/2022 12:49 PM Indications: pain and tendon swelling Details: 25 G needle, dorsal approach Medications: 1 mL lidocaine 1 %; 6 mg betamethasone acetate-betamethasone sodium phosphate 6 (3-3) MG/ML Outcome: tolerated well, no immediate complications Procedure, treatment alternatives, risks and benefits explained, specific risks discussed. Consent was given by the patient. Immediately prior to procedure a time out was called to verify the correct patient, procedure, equipment, support staff and site/side marked as required. Patient was prepped and draped in the usual sterile fashion.          Clinical History: No specialty comments available.  She reports that she has been smoking cigarettes. She has a 6.00 pack-year smoking history. She has never used smokeless tobacco. No results for input(s): "HGBA1C", "LABURIC" in the last 8760 hours.  Objective:   Vital Signs: LMP 03/07/2019   Physical Exam  Gen: Well-appearing, in no acute distress; non-toxic CV: Regular Rate. Well-perfused. Warm.  Resp: Breathing unlabored on room air; no wheezing. Psych: Fluid speech in conversation; appropriate affect; normal thought process Neuro: Sensation intact throughout. No gross coordination deficits.   Ortho Exam - Right hand: Positive TTP over the thickened nodule over the A1 pulley.  There is reproducible clicking with closed and open fist. Soft tissue swelling noted.   Imaging:  Narrative & Impression  CLINICAL DATA:  Bilateral hand pain   EXAM: LEFT HAND - 2 VIEW; RIGHT  HAND - 2 VIEW   COMPARISON:  None.   FINDINGS: No acute fracture or dislocation of the bilateral hands. Normal bony mineralization. Joint spaces are maintained. No bony erosion or periostitis. No evidence of other focal bone abnormality. No abnormal soft tissue mineralization. No focal soft tissue swelling.    IMPRESSION: No significant arthropathy of the bilateral hands.     Electronically  Signed   By: Duanne Guess D.O.   On: 01/16/2021 15:3    Past Medical/Family/Surgical/Social History: Medications & Allergies reviewed per EMR, new medications updated. Patient Active Problem List   Diagnosis Date Noted   Hyperlipidemia 06/07/2020   Myalgias, multiple 10/23/2014   Pain in joint, shoulder region 09/14/2014   Left knee pain 06/21/2014   GERD (gastroesophageal reflux disease) 03/14/2013   Anemia 03/14/2013   Smoking 03/14/2013   Appendicitis 03/14/2011   Past Medical History:  Diagnosis Date   Anemia    Constipation    COPD (chronic obstructive pulmonary disease)    Cough    GERD (gastroesophageal reflux disease)    Headache(784.0)    Migraine    Sore throat    Weakness    Weight loss    Family History  Problem Relation Age of Onset   Heart disease Mother    Hypertension Mother    Rheum arthritis Mother    Gout Mother    Cancer Father        bone   Rheum arthritis Father    Breast cancer Sister    Cancer Sister        breast   Heart disease Sister    Arthritis Sister    Epilepsy Brother    Heart Problems Brother    Arthritis Brother    Arthritis Brother    Arthritis Brother    Asthma Son    Asthma Daughter    Past Surgical History:  Procedure Laterality Date   BREAST BIOPSY Right 2022   LAPAROSCOPIC APPENDECTOMY  03/14/2011   Procedure: APPENDECTOMY LAPAROSCOPIC;  Surgeon: Kandis Cocking, MD;  Location: [facility] OR;  Service: General;  Laterality: N/A;   TUBAL LIGATION     Social History   Occupational History   Occupation: housekeeping  Tobacco Use   Smoking status: Every Day    Packs/day: 0.50    Years: 12.00    Additional pack years: 0.00    Total pack years: 6.00    Types: Cigarettes   Smokeless tobacco: Never   Tobacco comments:    Smoking 1 ppd  Vaping Use   Vaping Use: Never used  Substance and Sexual Activity   Alcohol use: No    Alcohol/week: 0.0 standard drinks of alcohol   Drug use: No   Sexual activity: Not  Currently    Birth control/protection: Post-menopausal

## 2022-09-01 ENCOUNTER — Ambulatory Visit (INDEPENDENT_AMBULATORY_CARE_PROVIDER_SITE_OTHER): Payer: Self-pay | Admitting: Sports Medicine

## 2022-09-01 ENCOUNTER — Encounter: Payer: Self-pay | Admitting: Sports Medicine

## 2022-09-01 DIAGNOSIS — M65311 Trigger thumb, right thumb: Secondary | ICD-10-CM

## 2022-09-01 DIAGNOSIS — Z5989 Other problems related to housing and economic circumstances: Secondary | ICD-10-CM

## 2022-09-01 MED ORDER — LIDOCAINE HCL 1 % IJ SOLN
1.0000 mL | INTRAMUSCULAR | Status: AC | PRN
  Administered 2022-09-01: 1 mL

## 2022-09-01 MED ORDER — BETAMETHASONE SOD PHOS & ACET 6 (3-3) MG/ML IJ SUSP
6.0000 mg | INTRAMUSCULAR | Status: AC | PRN
  Administered 2022-09-01: 6 mg via INTRA_ARTICULAR

## 2022-09-01 NOTE — Progress Notes (Signed)
Injection helped some Still having pain and triggering events though

## 2022-09-01 NOTE — Progress Notes (Signed)
Nicole Cisneros - 52 y.o. female MRN [id number]  Date of birth: 1970-05-26  Office Visit Note: Visit Date: 09/01/2022 PCP: Rema Fendt, NP Referred by: Rema Fendt, NP  Subjective: Chief Complaint  Patient presents with   Right Hand - Follow-up   HPI: Nicole Cisneros is a pleasant 52 y.o. female who presents today for follow-up of right trigger thumb.  Had visit with trigger thumb injection on 08/04/22 - injection certainly helped, lessened her pain and triggering events. Still having some triggering episodes however, mostly just in the morning.  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  Pertinent ROS were reviewed with the patient and found to be negative unless otherwise specified above in HPI.   Assessment & Plan: Visit Diagnoses:  1. Trigger thumb, right thumb   2. Uninsured    Plan: Ketsia is dealing with her trigger thumb, he did get improvement in pain and swelling and certainly less triggering after the A1 pulley injection, but is still experiencing some triggering.  We discussed treatment options such as repeat injection versus referral for surgical management, taking over-the-counter anti-inflammatories or bracing.  Through shared decision-making, elected to proceed with 1 additional trigger thumb injection.  We did apply Band-Aid splint for this.  I would like her to wear her thumb spica brace or thumb spica splint for the next 1 month during the day with activity. She will use ice as well as ibuprofen to help with pain and swelling.  We will follow-up in 6 weeks for reevaluation.  If for some reason she is still having triggering episodes, next step would likely be referral for surgical release. She is continuing to complete her paperwork for obtaining insurance through [employer], states she is almost done.  Follow-up: Return in about 6 weeks (around 10/13/2022) for right thumb.   Meds & Orders: No orders of the defined types were placed in  this encounter.   Orders Placed This Encounter  Procedures   Hand/UE Inj: R thumb A1     Procedures: Hand/UE Inj: R thumb A1 for trigger finger on 09/01/2022 11:10 AM Indications: pain and tendon swelling Details: 25 G needle, volar approach Medications: 1 mL lidocaine 1 %; 6 mg betamethasone acetate-betamethasone sodium phosphate 6 (3-3) MG/ML Outcome: tolerated well, no immediate complications  Trigger finger injection, right thumb finger After discussion on risks/benefits/indications and informed verbal consent was obtained, a timeout was performed. The area of tenderness at the A1 pulley was identified with palpable nodule located and marked. This area was cleaned with Betadine swab and alcohol swabs. After sterile precautions were taken, a 25-gauge, 5/8" needle was inserted into the A1 pulley with 1.0cc:1.0cc mixture of Lidocaine 1% and Betamethasone 6mg /mL. Patient tolerated procedure well and was observed for at least 5 minutes after injection with resolution of pain and improved ROM.  Procedure, treatment alternatives, risks and benefits explained, specific risks discussed. Consent was given by the patient. Immediately prior to procedure a time out was called to verify the correct patient, procedure, equipment, support staff and site/side marked as required. Patient was prepped and draped in the usual sterile fashion.          Clinical History: No specialty comments available.  She reports that she has been smoking cigarettes. She has a 6.00 pack-year smoking history. She has never used smokeless tobacco. No results for input(s): "HGBA1C", "LABURIC" in the last 8760 hours.  Objective:   Vital Signs: LMP 03/07/2019  Physical Exam  Gen: Well-appearing, in no acute distress; non-toxic CV: Well-perfused. Warm.  Resp: Breathing unlabored on room air; no wheezing. Psych: Fluid speech in conversation; appropriate affect; normal thought process Neuro: Sensation intact throughout.  No gross coordination deficits.   Ortho Exam - Right hand/thumb: Mild TTP over the volar aspect of the A1 pulley.  There is no swelling or redness which is improved from last visit.  There is some infrequent triggering episodes demonstrated in office, this is improved with counterpressure over the A1 pulley.   Imaging:  CLINICAL DATA: Bilateral hand pain  EXAM: LEFT HAND - 2 VIEW; RIGHT HAND - 2 VIEW  COMPARISON: None.  FINDINGS: No acute fracture or dislocation of the bilateral hands. Normal bony mineralization. Joint spaces are maintained. No bony erosion or periostitis. No evidence of other focal bone abnormality. No abnormal soft tissue mineralization. No focal soft tissue swelling.  IMPRESSION: No significant arthropathy of the bilateral hands.   Electronically Signed By: Duanne Guess D.O. On: 01/16/2021 15:37   Past Medical/Family/Surgical/Social History: Medications & Allergies reviewed per EMR, new medications updated. Patient Active Problem List   Diagnosis Date Noted   Hyperlipidemia 06/07/2020   Myalgias, multiple 10/23/2014   Pain in joint, shoulder region 09/14/2014   Left knee pain 06/21/2014   GERD (gastroesophageal reflux disease) 03/14/2013   Anemia 03/14/2013   Smoking 03/14/2013   Appendicitis 03/14/2011   Past Medical History:  Diagnosis Date   Anemia    Constipation    COPD (chronic obstructive pulmonary disease) (HCC)    Cough    GERD (gastroesophageal reflux disease)    Headache(784.0)    Migraine    Sore throat    Weakness    Weight loss    Family History  Problem Relation Age of Onset   Heart disease Mother    Hypertension Mother    Rheum arthritis Mother    Gout Mother    Cancer Father        bone   Rheum arthritis Father    Breast cancer Sister    Cancer Sister        breast   Heart disease Sister    Arthritis Sister    Epilepsy Brother    Heart Problems Brother    Arthritis Brother    Arthritis Brother    Arthritis  Brother    Asthma Son    Asthma Daughter    Past Surgical History:  Procedure Laterality Date   BREAST BIOPSY Right 2022   LAPAROSCOPIC APPENDECTOMY  03/14/2011   Procedure: APPENDECTOMY LAPAROSCOPIC;  Surgeon: Kandis Cocking, MD;  Location: [facility] OR;  Service: General;  Laterality: N/A;   TUBAL LIGATION     Social History   Occupational History   Occupation: housekeeping  Tobacco Use   Smoking status: Every Day    Packs/day: 0.50    Years: 12.00    Additional pack years: 0.00    Total pack years: 6.00    Types: Cigarettes   Smokeless tobacco: Never   Tobacco comments:    Smoking 1 ppd  Vaping Use   Vaping Use: Never used  Substance and Sexual Activity   Alcohol use: No    Alcohol/week: 0.0 standard drinks of alcohol   Drug use: No   Sexual activity: Not Currently    Birth control/protection: Post-menopausal

## 2022-10-07 IMAGING — US US BREAST*R* LIMITED INC AXILLA
1 series · 9 of 9 positions shown · non-contrast
Comparison: Previous exam(s).

CLINICAL DATA: 50-year-old female presenting as a recall from
screening for possible right breast mass. Family history of breast
cancer in the patient's sister in her 40s.

EXAM:
DIGITAL DIAGNOSTIC UNILATERAL RIGHT MAMMOGRAM WITH TOMOSYNTHESIS AND
CAD; ULTRASOUND RIGHT BREAST LIMITED
TECHNIQUE: Right digital diagnostic mammography and breast tomosynthesis was
performed. The images were evaluated with computer-aided detection.;
Targeted ultrasound examination of the right breast was performed

[Series 1: us breast*right* limited inc axilla · 0.06mm/px · 9 of 9 slices shown]
[im 1/9]
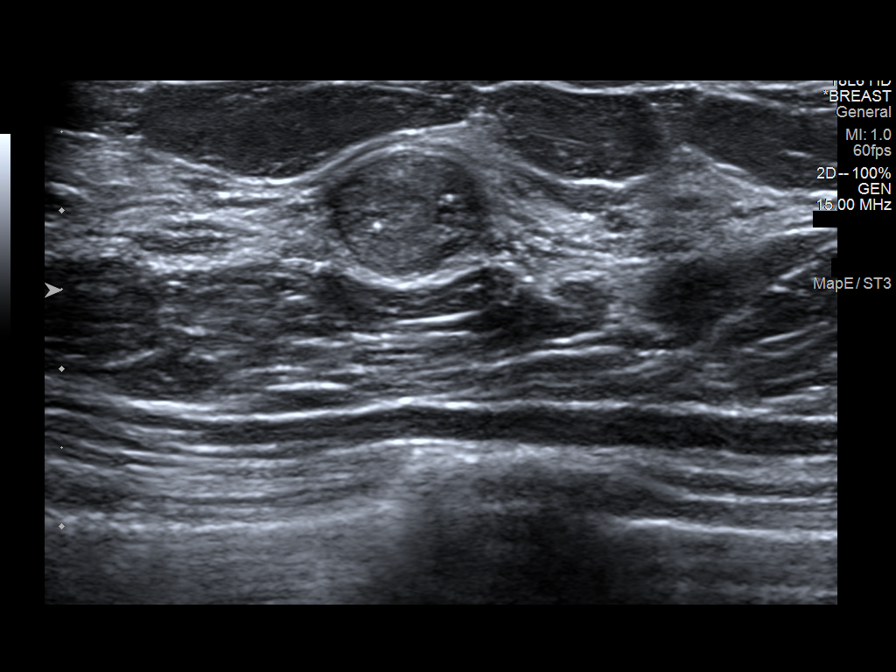
[im 2/9]
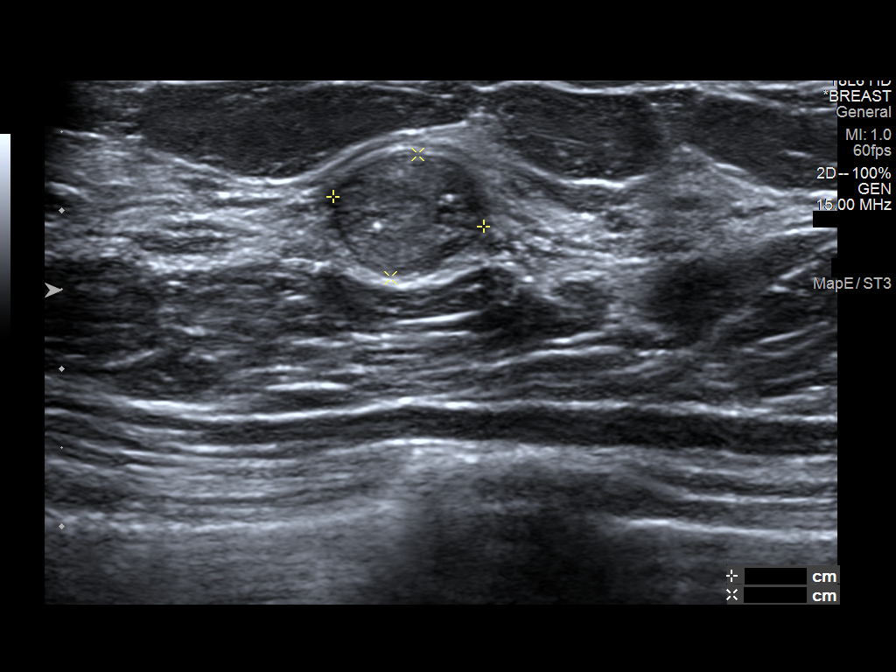
[im 3/9]
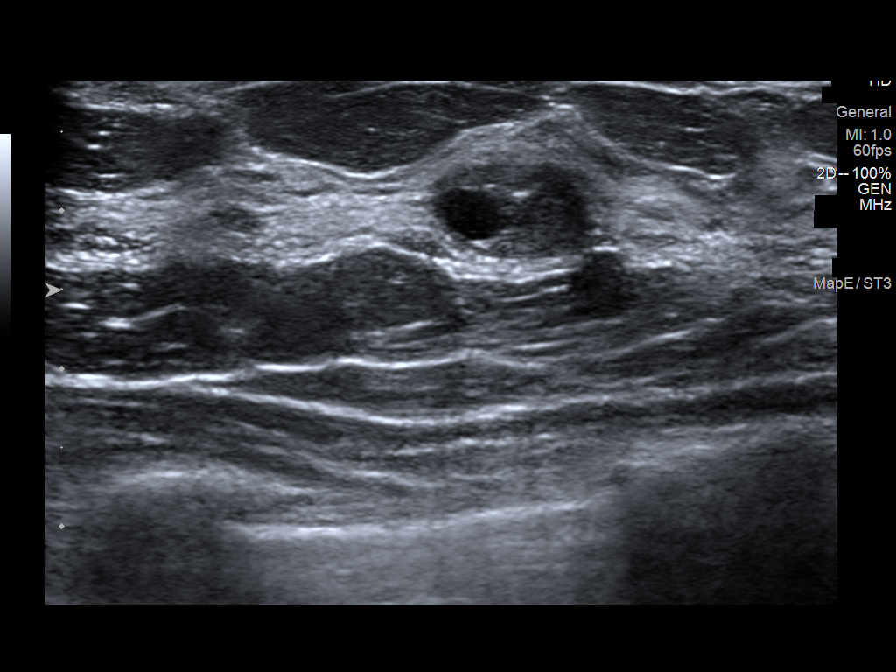
[im 4/9]
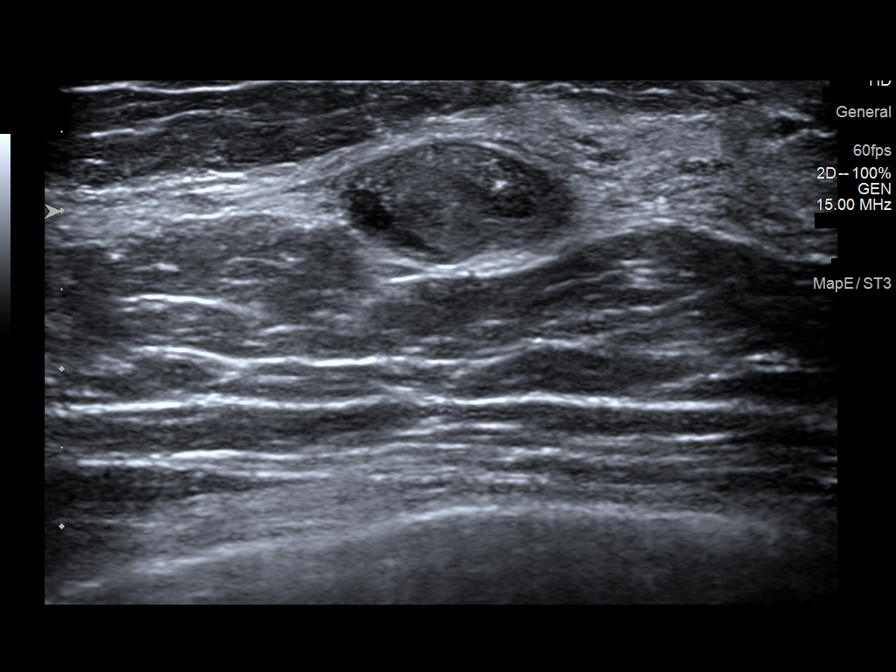
[im 5/9]
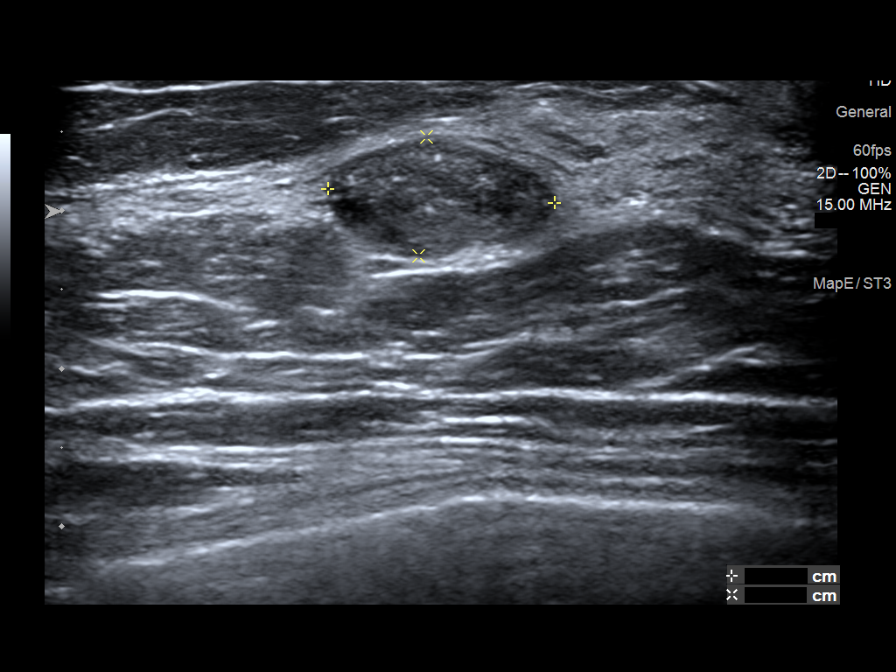
[im 6/9]
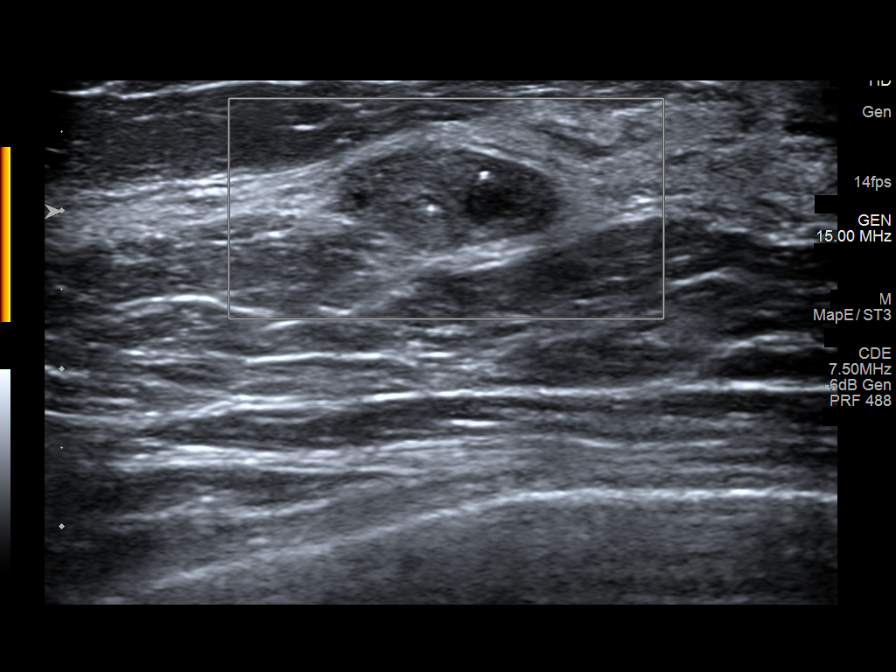
[im 7/9]
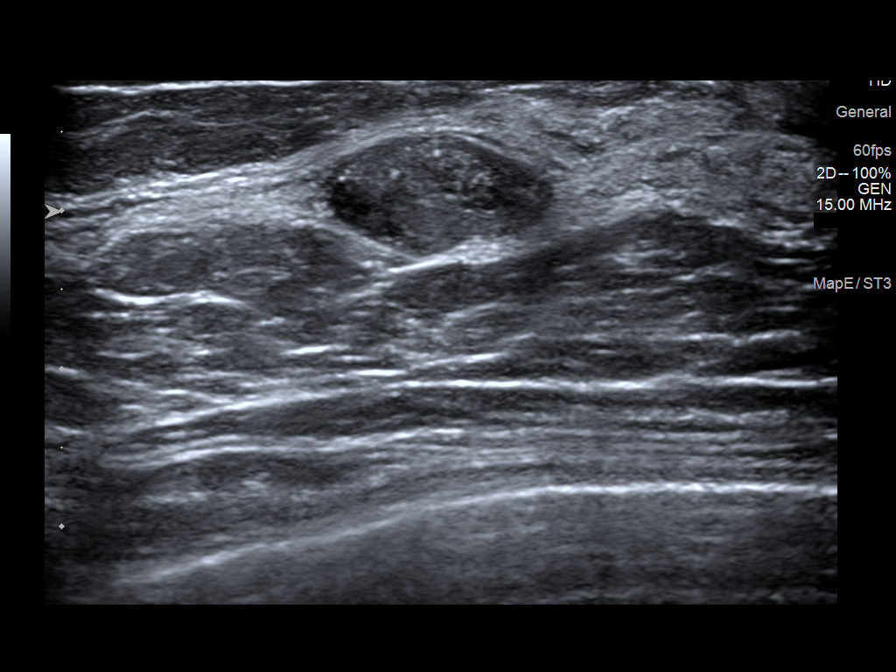
[im 8/9]
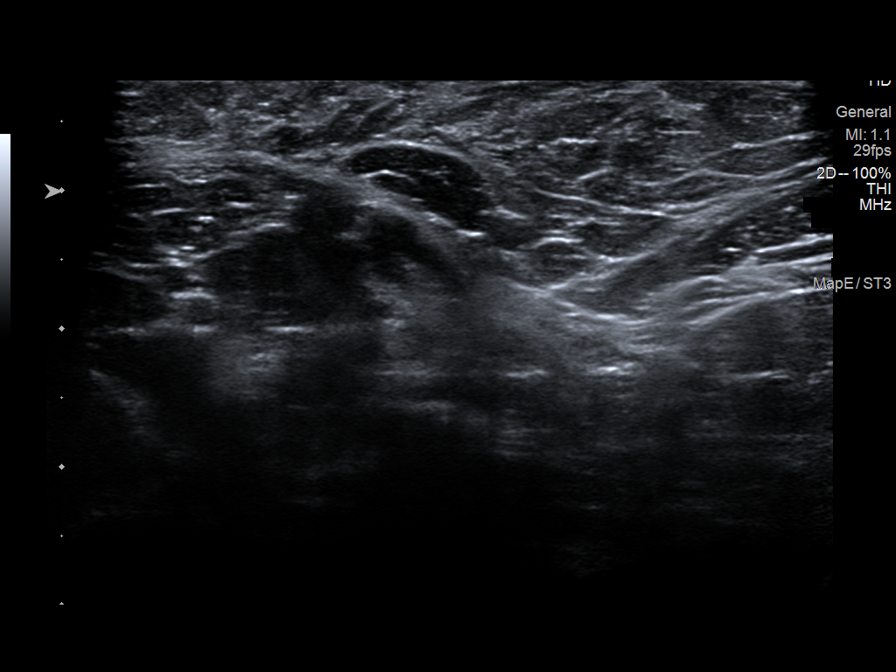
[im 9/9]
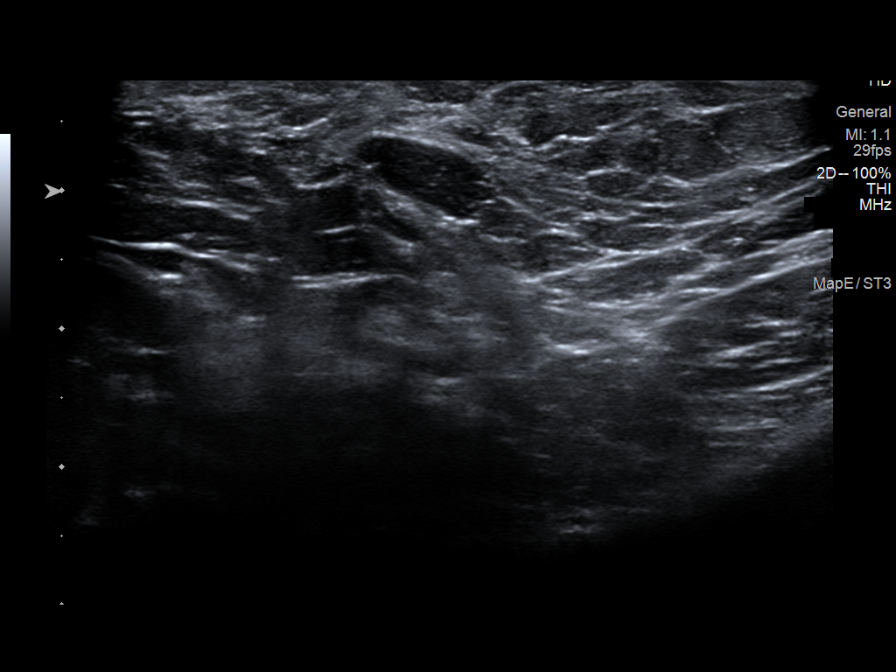

[9 of 9 positions shown; findings below may reference images not displayed]

ACR Breast Density Category b: There are scattered areas of
fibroglandular density.
FINDINGS: Mammogram:

Spot compression tomosynthesis cc and full field mL tomosynthesis
views of the right breast were performed. There is suggestion of an
obscured mass measuring approximately 1 cm in the outer right breast
anterior depth.

Ultrasound:

Targeted ultrasound performed in the right breast at 9 o'clock 1 cm
from the nipple demonstrating an oval circumscribed hypoechoic mass
measuring 1.4 x 0.8 x 1.0 cm, possibly a fibroadenoma.

Targeted ultrasound the right axilla demonstrates normal lymph
nodes.
IMPRESSION: Probably benign mass in the right breast at 9 o'clock, likely a
fibroadenoma.

RECOMMENDATION:
Ultrasound-guided core needle biopsy of the right breast mass at 9
o'clock. Discussed with patient the options of short-term follow-up
versus biopsy.

The patient will be scheduled for the biopsy appointment prior to
leaving the office today.

BI-RADS CATEGORY  3: Probably benign.

## 2022-10-27 ENCOUNTER — Encounter: Payer: Self-pay | Admitting: Podiatry

## 2022-10-27 ENCOUNTER — Ambulatory Visit: Payer: Medicaid Other | Admitting: Podiatry

## 2022-10-27 DIAGNOSIS — M7751 Other enthesopathy of right foot: Secondary | ICD-10-CM

## 2022-10-27 DIAGNOSIS — B351 Tinea unguium: Secondary | ICD-10-CM | POA: Diagnosis not present

## 2022-10-27 DIAGNOSIS — M779 Enthesopathy, unspecified: Secondary | ICD-10-CM

## 2022-10-27 MED ORDER — DICLOFENAC SODIUM 75 MG PO TBEC: 75.0000 mg | DELAYED_RELEASE_TABLET | Freq: Two times a day (BID) | ORAL | 2 refills | Status: DC

## 2022-10-28 NOTE — Progress Notes (Signed)
Subjective:   Patient ID: Nicole Cisneros, female   DOB: 52 y.o.   MRN: [id number]   HPI Patient presents concerned about generalized pain in her feet cramping and also nail disease that is been there for years 1-5 both feet.  Patient does work on her feet smokes a half a pack of cigarettes per day and is not active   Review of Systems  All other systems reviewed and are negative.       Objective:  Physical Exam Vitals and nursing note reviewed.  Constitutional:      Appearance: She is well-developed.  Pulmonary:     Effort: Pulmonary effort is normal.  Musculoskeletal:        General: Normal range of motion.  Skin:    General: Skin is warm.  Neurological:     Mental Status: She is alert.     Neurovascular status was found to be intact muscle strength found to be adequate range of motion adequate with patient found to have inflammation of the generalized nature of both feet that seems to occur within the arch and into the posterior Achilles insertion.  It is localized and I did not note any significant other pathology does have dry skin and thick nailbeds 1-5 both feet     Assessment:  Appears to be generalized pain versus specific with mycotic nail infection and generalized foot cramping and dry skin     Plan:  H&P reviewed all these conditions.  At this point I went ahead and I did courtesy debridement of the nails I do not see oral antifungal as a viable option for her I discussed stretching exercises different types of shoe she could wear and wrote her a prescription for diclofenac to try to reduce inflammatory processes

## 2022-11-25 ENCOUNTER — Ambulatory Visit: Payer: Medicaid Other | Admitting: Sports Medicine

## 2022-11-25 DIAGNOSIS — M25571 Pain in right ankle and joints of right foot: Secondary | ICD-10-CM

## 2022-11-25 DIAGNOSIS — M65311 Trigger thumb, right thumb: Secondary | ICD-10-CM | POA: Diagnosis not present

## 2022-11-25 DIAGNOSIS — K219 Gastro-esophageal reflux disease without esophagitis: Secondary | ICD-10-CM | POA: Diagnosis not present

## 2022-11-25 DIAGNOSIS — M76821 Posterior tibial tendinitis, right leg: Secondary | ICD-10-CM

## 2022-11-25 MED ORDER — CELECOXIB 100 MG PO CAPS: 100.0000 mg | ORAL_CAPSULE | Freq: Two times a day (BID) | ORAL | 1 refills | Status: AC

## 2022-11-25 NOTE — Progress Notes (Signed)
Nicole Cisneros - 52 y.o. female MRN [id number]  Date of birth: 12-04-1970  Office Visit Note: Visit Date: 11/25/2022 PCP: Rema Fendt, NP Referred by: Rema Fendt, NP  Subjective: Chief Complaint  Patient presents with   Right Thumb - Pain    Can't move thumb, injection helped with the swelling knot still there    HPI: Nicole Cisneros is a pleasant 52 y.o. female who presents today for right trigger thumb. She is left-hand dominant, but does all of her gripping/sweeping work with right hand.  Had last injection on 09/01/22 -this helped her pain and triggering significantly, although never had a time where it relieved her triggering 100%.  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   Also dealing with some right leg and medial ankle pain.  Pain going up onto the toes.  Recently saw podiatry and was started on diclofenac 75 mg twice daily.  She does have a history of GERD and this will bother her stomach at times.  Pertinent ROS were reviewed with the patient and found to be negative unless otherwise specified above in HPI.   Assessment & Plan: Visit Diagnoses:  1. Trigger thumb, right thumb   2. Posterior tibial tendinitis, right leg   3. Pain in right ankle and joints of right foot   4. Gastroesophageal reflux disease, unspecified whether esophagitis present    Plan: Discussed with Nicole Cisneros that she is dealing with an exacerbation of her right trigger thumb.  She did get good relief after trigger thumb injection, but her symptoms never 100% improved.  She is still having daily active triggering now that it has been almost 3 months from the injection and is worsening again.  She does work with her hands for her work and this is interfering.  At this point, I think her best option is surgical release.  Will have her see my partner Dr. Fara Boros for this evaluation and surgical discussion.  In terms  of her ankle and posterior tibial pain, she was having reflux issues with diclofenac, we will discontinue this and start her on Celebrex she may take 100 mg once daily, can take second dose if needed.  Ultimately can see her back for this and get her started in some home therapy or rehab if not improving with above.  Follow-up: Return for make appt with Dr. Fara Boros for R-thumb trigger finger - surgery discussion.   Meds & Orders:  Meds ordered this encounter  Medications   celecoxib (CELEBREX) 100 MG capsule    Sig: Take 1 capsule (100 mg total) by mouth 2 (two) times daily.    Dispense:  30 capsule    Refill:  1   No orders of the defined types were placed in this encounter.    Procedures: No procedures performed      Clinical History: No specialty comments available.  She reports that she has been smoking cigarettes. She has a 6 pack-year smoking history. She has never used smokeless tobacco. No results for input(s): "HGBA1C", "LABURIC" in the last 8760 hours.  Objective:   Vital Signs: LMP 03/07/2019   Physical Exam  Gen: Well-appearing, in no acute distress; non-toxic CV: Well-perfused. Warm.  Resp: Breathing unlabored on room air; no wheezing. Psych: Fluid speech in conversation; appropriate affect; normal thought process Neuro: Sensation intact throughout. No gross coordination deficits.   Ortho Exam - Right  ankle: There is mild TTP palpating along the course of the posterior tibial tendon.  Has some difficulty with single-leg heel raise in seated position.  No redness or effusion of the ankle.  - Right thumb: Positive TTP over the volar aspect of the A1 pulley with thickening and nodularity here.  There is reproducible triggering episodes in the office, this is slightly improved with counterpressure over the A1 pulley.  Soft tissue swelling in this region without warmth or redness.  Imaging:  Narrative & Impression  CLINICAL DATA:  Bilateral hand pain   EXAM: LEFT HAND -  2 VIEW; RIGHT HAND - 2 VIEW   COMPARISON:  None.   FINDINGS: No acute fracture or dislocation of the bilateral hands. Normal bony mineralization. Joint spaces are maintained. No bony erosion or periostitis. No evidence of other focal bone abnormality. No abnormal soft tissue mineralization. No focal soft tissue swelling.   IMPRESSION: No significant arthropathy of the bilateral hands.     Electronically Signed   By: Duanne Guess D.O.   On: 01/16/2021 15:37    Past Medical/Family/Surgical/Social History: Medications & Allergies reviewed per EMR, new medications updated. Patient Active Problem List   Diagnosis Date Noted   Hyperlipidemia 06/07/2020   Myalgias, multiple 10/23/2014   Pain in joint, shoulder region 09/14/2014   Left knee pain 06/21/2014   GERD (gastroesophageal reflux disease) 03/14/2013   Anemia 03/14/2013   Smoking 03/14/2013   Appendicitis 03/14/2011   Past Medical History:  Diagnosis Date   Anemia    Constipation    COPD (chronic obstructive pulmonary disease) (HCC)    Cough    GERD (gastroesophageal reflux disease)    Headache(784.0)    Migraine    Sore throat    Weakness    Weight loss    Family History  Problem Relation Age of Onset   Heart disease Mother    Hypertension Mother    Rheum arthritis Mother    Gout Mother    Cancer Father        bone   Rheum arthritis Father    Breast cancer Sister    Cancer Sister        breast   Heart disease Sister    Arthritis Sister    Epilepsy Brother    Heart Problems Brother    Arthritis Brother    Arthritis Brother    Arthritis Brother    Asthma Son    Asthma Daughter    Past Surgical History:  Procedure Laterality Date   BREAST BIOPSY Right 2022   LAPAROSCOPIC APPENDECTOMY  03/14/2011   Procedure: APPENDECTOMY LAPAROSCOPIC;  Surgeon: Kandis Cocking, MD;  Location: [facility] OR;  Service: General;  Laterality: N/A;   TUBAL LIGATION     Social History   Occupational History   Occupation:  housekeeping  Tobacco Use   Smoking status: Every Day    Current packs/day: 0.50    Average packs/day: 0.5 packs/day for 12.0 years (6.0 ttl pk-yrs)    Types: Cigarettes   Smokeless tobacco: Never   Tobacco comments:    Smoking 1 ppd  Vaping Use   Vaping status: Never Used  Substance and Sexual Activity   Alcohol use: No    Alcohol/week: 0.0 standard drinks of alcohol   Drug use: No   Sexual activity: Not Currently    Birth control/protection: Post-menopausal

## 2022-12-22 ENCOUNTER — Ambulatory Visit: Payer: Medicaid Other | Admitting: Orthopedic Surgery

## 2022-12-22 ENCOUNTER — Other Ambulatory Visit (INDEPENDENT_AMBULATORY_CARE_PROVIDER_SITE_OTHER): Payer: Medicaid Other

## 2022-12-22 DIAGNOSIS — M65311 Trigger thumb, right thumb: Secondary | ICD-10-CM

## 2022-12-22 NOTE — Progress Notes (Signed)
Nicole Cisneros - 52 y.o. female MRN [id number]  Date of birth: [birth year]  Office Visit Note: Visit Date: 12/22/2022 PCP: Rema Fendt, NP Referred by: Rema Fendt, NP  Subjective: No chief complaint on file.  HPI: Nicole Cisneros is a 52 y.o. female who presents today for evaluation of ongoing right trigger thumb that is refractory to conservative care.  She has been seen by Dr. Shon Baton in the past and has undergone prior cortisone injections x 2, most recent being May of this year.  She has ongoing clicking and locking of the right thumb, occasionally requiring manual correction.  She is a pack per day smoker, works in Education officer, environmental, right-hand-dominant.  Pertinent ROS were reviewed with the patient and found to be negative unless otherwise specified above in HPI.   Visit Reason: right thumb Hand dominance: left Occupation: Recruitment consultant  Diabetic: No Heart/Lung History: none Blood Thinners: none  Prior Testing/EMG: Injections (Date): 08/04/22 & 09/01/22 Treatments: injections Prior Surgery: none   Assessment & Plan: Visit Diagnoses:  1. Trigger thumb, right thumb     Plan: Extensive discussion was had with patient today regarding her ongoing right thumb trigger digit that is refractory to conservative care.  At this juncture, she is indicated for right thumb trigger digit release.  Risks and benefits of the procedure were discussed, risks including but not limited to infection, bleeding, scarring, stiffness, nerve injury, tendon injury, vascular injury, recurrence of symptoms and need for subsequent operation.  Patient expressed understanding.  I did also explain in detail the importance of smoking cessation, particularly in the perioperative period in order to limit the risk of complication.  Given her ongoing smoking, this would create higher risk for potential complication postoperatively.  Patient expressed understanding.  We will move forward with scheduling of right  thumb trigger digit release, she would like surgery to be performed under local anesthesia.    Follow-up: No follow-ups on file.   Meds & Orders: No orders of the defined types were placed in this encounter.   Orders Placed This Encounter  Procedures   XR Hand Complete Right     Procedures: No procedures performed      Clinical History: No specialty comments available.  She reports that she has been smoking cigarettes. She has a 6 pack-year smoking history. She has never used smokeless tobacco. No results for input(s): "HGBA1C", "LABURIC" in the last 8760 hours.  Objective:   Vital Signs: LMP 03/07/2019   Physical Exam  Gen: Well-appearing, in no acute distress; non-toxic CV: Regular Rate. Well-perfused. Warm.  Resp: Breathing unlabored on room air; no wheezing. Psych: Fluid speech in conversation; appropriate affect; normal thought process  Ortho Exam Right thumb: - Palpable nodule at the A1 region, tenderness associated - Notable clicking of the right thumb with deep flexion at the IP joint - Sensation intact distally, skin is intact - Hand is warm well-perfused  Imaging: XR Hand Complete Right  Result Date: 12/22/2022 X-rays of the right hand, multiple views were obtained today X-rays demonstrate appropriate appearance of the radiocarpal and midcarpal intervals without significant bony abnormalities.  Bone mineralization is appropriate.   Past Medical/Family/Surgical/Social History: Medications & Allergies reviewed per EMR, new medications updated. Patient Active Problem List   Diagnosis Date Noted   Hyperlipidemia 06/07/2020   Myalgias, multiple 10/23/2014   Pain in joint, shoulder region 09/14/2014   Left knee pain 06/21/2014   GERD (gastroesophageal reflux disease) 03/14/2013   Anemia 03/14/2013  Smoking 03/14/2013   Appendicitis 03/14/2011   Past Medical History:  Diagnosis Date   Anemia    Constipation    COPD (chronic obstructive pulmonary disease)  (HCC)    Cough    GERD (gastroesophageal reflux disease)    Headache(784.0)    Migraine    Sore throat    Weakness    Weight loss    Family History  Problem Relation Age of Onset   Heart disease Mother    Hypertension Mother    Rheum arthritis Mother    Gout Mother    Cancer Father        bone   Rheum arthritis Father    Breast cancer Sister    Cancer Sister        breast   Heart disease Sister    Arthritis Sister    Epilepsy Brother    Heart Problems Brother    Arthritis Brother    Arthritis Brother    Arthritis Brother    Asthma Son    Asthma Daughter    Past Surgical History:  Procedure Laterality Date   BREAST BIOPSY Right 2022   LAPAROSCOPIC APPENDECTOMY  03/14/2011   Procedure: APPENDECTOMY LAPAROSCOPIC;  Surgeon: Kandis Cocking, MD;  Location: [facility] OR;  Service: General;  Laterality: N/A;   TUBAL LIGATION     Social History   Occupational History   Occupation: housekeeping  Tobacco Use   Smoking status: Every Day    Current packs/day: 0.50    Average packs/day: 0.5 packs/day for 12.0 years (6.0 ttl pk-yrs)    Types: Cigarettes   Smokeless tobacco: Never   Tobacco comments:    Smoking 1 ppd  Vaping Use   Vaping status: Never Used  Substance and Sexual Activity   Alcohol use: No    Alcohol/week: 0.0 standard drinks of alcohol   Drug use: No   Sexual activity: Not Currently    Birth control/protection: Post-menopausal    Hibo Blasdell Fara Boros) Denese Killings, M.D. [facility] 9:56 AM

## 2023-01-01 ENCOUNTER — Other Ambulatory Visit: Payer: Self-pay

## 2023-01-01 ENCOUNTER — Other Ambulatory Visit: Payer: Self-pay | Admitting: Orthopedic Surgery

## 2023-01-01 MED ORDER — OXYCODONE HCL 5 MG PO TABS
5.0000 mg | ORAL_TABLET | Freq: Four times a day (QID) | ORAL | 0 refills | Status: AC | PRN
  Filled 2023-01-01: qty 10, 3d supply, fill #0

## 2023-01-05 ENCOUNTER — Other Ambulatory Visit: Payer: Self-pay

## 2023-01-16 ENCOUNTER — Ambulatory Visit (INDEPENDENT_AMBULATORY_CARE_PROVIDER_SITE_OTHER): Payer: Medicaid Other | Admitting: Orthopedic Surgery

## 2023-01-16 DIAGNOSIS — Z9889 Other specified postprocedural states: Secondary | ICD-10-CM

## 2023-01-16 DIAGNOSIS — M65311 Trigger thumb, right thumb: Secondary | ICD-10-CM

## 2023-01-16 NOTE — Progress Notes (Signed)
Nicole Cisneros - 52 y.o. female MRN [id number]  Date of birth: June 05, 1970  Office Visit Note: Visit Date: 01/16/2023 PCP: Rema Fendt, NP Referred by: Rema Fendt, NP  Subjective:  HPI: Nicole Cisneros is a 52 y.o. female who presents today for follow up 2 weeks status post right thumb trigger release.  Pain is controlled, ongoing stiffness at the right thumb.  Pertinent ROS were reviewed with the patient and found to be negative unless otherwise specified above in HPI.   Assessment & Plan: Visit Diagnoses: No diagnosis found.  Plan: Sutures removed today.  She can begin activities as tolerated with range of motion of the right thumb.  Will send a referral for therapy to begin range of motion of the right thumb and hand with transition to home exercises as deemed appropriate.  Follow-up in approximate 4 weeks for recheck.  Work note provided today for no use of the right hand until next orthopedic follow-up.  Follow-up: No follow-ups on file.   Meds & Orders: No orders of the defined types were placed in this encounter.  No orders of the defined types were placed in this encounter.    Procedures: No procedures performed       Objective:   Vital Signs: LMP 03/07/2019   Ortho Exam Right hand: Thumb - Well-healing incision base of the thumb, sutures in place, skin edges well-approximated, no erythema or drainage - Able to form flexion extension of the IP joint, no residual clicking or locking - Normal color and capillary refill distally, sensation is intact  Imaging: No results found.   Dann Ventress Trevor Mace, M.D. [facility] 9:28 AM

## 2023-01-21 ENCOUNTER — Ambulatory Visit: Payer: Medicaid Other | Attending: Orthopedic Surgery | Admitting: Occupational Therapy

## 2023-01-21 ENCOUNTER — Encounter: Payer: Self-pay | Admitting: Occupational Therapy

## 2023-01-21 DIAGNOSIS — M79641 Pain in right hand: Secondary | ICD-10-CM | POA: Diagnosis present

## 2023-01-21 DIAGNOSIS — R208 Other disturbances of skin sensation: Secondary | ICD-10-CM

## 2023-01-21 DIAGNOSIS — M65311 Trigger thumb, right thumb: Secondary | ICD-10-CM | POA: Insufficient documentation

## 2023-01-21 DIAGNOSIS — R29898 Other symptoms and signs involving the musculoskeletal system: Secondary | ICD-10-CM | POA: Diagnosis present

## 2023-01-21 DIAGNOSIS — M6281 Muscle weakness (generalized): Secondary | ICD-10-CM

## 2023-01-21 NOTE — Therapy (Signed)
OUTPATIENT OCCUPATIONAL THERAPY ORTHO EVALUATION  Patient Name: Nicole Cisneros MRN: [id number] DOB:03-Sep-1970, 52 y.o., female Today's Date: 01/21/2023  PCP: Fleet Contras, MD  REFERRING PROVIDER: Samuella Cota, MD  END OF SESSION:  OT End of Session - 01/21/23 1020     Visit Number 1    Number of Visits 9    Date for OT Re-Evaluation 03/20/23    Authorization Type [place] Medicaid - requires auth    OT Start Time 1020    OT Stop Time 1100    OT Time Calculation (min) 40 min    Activity Tolerance Patient tolerated treatment well    Behavior During Therapy WFL for tasks assessed/performed             Past Medical History:  Diagnosis Date   Anemia    Constipation    COPD (chronic obstructive pulmonary disease) (HCC)    Cough    GERD (gastroesophageal reflux disease)    Headache(784.0)    Migraine    Sore throat    Weakness    Weight loss    Past Surgical History:  Procedure Laterality Date   BREAST BIOPSY Right 2022   LAPAROSCOPIC APPENDECTOMY  03/14/2011   Procedure: APPENDECTOMY LAPAROSCOPIC;  Surgeon: Kandis Cocking, MD;  Location: [facility] OR;  Service: General;  Laterality: N/A;   TUBAL LIGATION     Patient Active Problem List   Diagnosis Date Noted   Hyperlipidemia 06/07/2020   Myalgias, multiple 10/23/2014   Pain in joint, shoulder region 09/14/2014   Left knee pain 06/21/2014   GERD (gastroesophageal reflux disease) 03/14/2013   Anemia 03/14/2013   Smoking 03/14/2013   Appendicitis 03/14/2011    ONSET DATE: 01/01/2023 (date of surgery)  REFERRING DIAG: M65.311 (ICD-10-CM) - Trigger thumb, right thumb  THERAPY DIAG:  Pain in right hand  Muscle weakness (generalized)  Other disturbances of skin sensation  Other symptoms and signs involving the musculoskeletal system  Rationale for Evaluation and Treatment: Rehabilitation  SUBJECTIVE:   SUBJECTIVE STATEMENT: Uses R hand mostly and writes with left hand. Pt accompanied by: self  PERTINENT  HISTORY: PMH: Anemia, L knee pain, R shoulder pain, HLD, migraine, COPD, and smoking  Pt can begin activities as tolerated with range of motion of the right thumb. Will transition to home exercises as deemed appropriate. Follow-up in approximate 4 weeks for recheck.  S/P right thumb trigger release  Needs ROM  PRECAUTIONS: Other: to tolerance  WEIGHT BEARING RESTRICTIONS: No  PAIN:  Are you having pain? Yes: NPRS scale: 7/10 Pain location: R thumb Pain description: stiff Aggravating factors: unknown Relieving factors: medication; ice; heat  FALLS: Has patient fallen in last 6 months? Yes. 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  HAND DOMINANCE: Left  ADLs: Overall ADLs: mod I  FUNCTIONAL OUTCOME MEASURES: Quick Dash: 90.9 %   UPPER EXTREMITY ROM:     RUE grossly WFL  Active ROM Right eval Left eval  Thumb MCP (0-60) impaired WNL  Thumb IP (0-80) impaired WNL  Thumb Radial abd/add (0-55) impaired  WNL  Thumb Palmar abd/add (0-45) impaired  WNL  Thumb Opposition  to Small Finger Lacks 2 cm  WNL  (Blank rows = not tested)   UPPER EXTREMITY MMT:     Proximal BUE WFL  HAND FUNCTION: Grip strength: Right: 13.6 (with adjusted hold and avoiding increase to pain) lbs; Left: 41.8 lbs  COORDINATION: 9 Hole Peg test: Right: 32 sec; Left: 30 sec  SENSATION: Paresthesias to R thumb  EDEMA: mild edema reported and observed  COGNITION: Overall cognitive status: Within functional limits for tasks assessed  OBSERVATIONS: Pt appears well-kept. Bandaid donned to R thumb with  hypersensitivity to light touch. Pt holding R hand in guarded position.  TODAY'S TREATMENT:                                                                                                                               OT educated pt on HEP completion as noted in pt instructions as needed for improved R thumb ROM and pain.   OT educated pt not to carry R hand in guarded position or replace band aid over incision to reduce hypersensitivity, improve skin integrity, and pain while promoting functional use as able.  PATIENT EDUCATION: Education details: OT Role and POC; ROM; management of pain and hypersensitivity Person educated: Patient Education method: Explanation Education comprehension: verbalized understanding  HOME EXERCISE PROGRAM: 01/21/2023: R thumb ROM  GOALS:  SHORT TERM GOALS: Target date: 02/20/2023    Patient will demonstrate independence with initial RUE HEP. Baseline: not yet initiated Goal status: INITIAL  2.  Pt will be independent with scar management and desensitization techniques as needed to manage RUE pain and hypersensitivity.  Baseline: not yet initiated Goal status: INITIAL  3.  Pt will demonstrate full R opposition to little finger. Baseline: lacks 2 cm Goal status: INITIAL   LONG TERM GOALS: Target date: 03/20/2023  Patient will demonstrate updated RUE HEP with 25% verbal cues or less for proper execution. Baseline: not yet initiated Goal status: INITIAL  2.  Patient will demonstrate at least 16% improvement with quick Dash score (reporting 74.9% disability or less) indicating improved functional use of affected extremity. Baseline: 90.9% disability with use of RUE Goal status: INITIAL  3.  Patient will demonstrate at least 30 lbs R grip strength as needed to open jars and other containers. Baseline: 13.6 lbs (with adjusted hold and avoiding increase to pain) Goal status: INITIAL   ASSESSMENT:  CLINICAL IMPRESSION: Patient is a 52 y.o. female  who was seen today for occupational therapy evaluation for trigger finger release of R thumb. Hx includes Anemia, L knee pain, R shoulder pain, HLD, migraine, COPD, and smoking. Patient currently presents below baseline level of functioning demonstrating functional deficits and impairments as noted below. Pt would benefit from skilled OT services in the outpatient setting to work on impairments as noted below to help pt return to PLOF as able.    PERFORMANCE DEFICITS: in functional skills including ADLs, IADLs, coordination, sensation, ROM, strength, pain, Fine motor control, and  UE functional use.   IMPAIRMENTS: are limiting patient from ADLs, IADLs, rest and sleep, work, and leisure.   COMORBIDITIES: may have co-morbidities  that affects occupational performance. Patient will benefit from skilled OT to address above impairments and improve overall function.  MODIFICATION OR ASSISTANCE TO COMPLETE EVALUATION: Min-Moderate modification of tasks or assist with assess necessary to complete an evaluation.  OT OCCUPATIONAL PROFILE AND HISTORY: Problem focused assessment: Including review of records relating to presenting problem.  CLINICAL DECISION MAKING: LOW - limited treatment options, no task modification necessary  REHAB POTENTIAL: Good  EVALUATION COMPLEXITY: Low     PLAN:  OT FREQUENCY: 1x/week  OT DURATION: 8 weeks  PLANNED INTERVENTIONS: self care/ADL training, therapeutic exercise, therapeutic activity, manual therapy, scar mobilization, passive range of motion, splinting, ultrasound, paraffin, fluidotherapy, moist heat, contrast bath, patient/family education, DME and/or AE instructions, and Re-evaluation  RECOMMENDED OTHER SERVICES: none at this time  CONSULTED AND AGREED WITH PLAN OF CARE: Patient  PLAN FOR NEXT SESSION: Review ROM HEP; educate on scar massage and desensitization; pain management (fluido or [place])   Delana Meyer, OT 01/21/2023, 6:27 PM  Check all  possible CPT codes: 16109 - OT Re-evaluation, 97110- Therapeutic Exercise, [phone]- Neuro Re-education, 97140 - Manual Therapy, 97530 - Therapeutic Activities, [phone] - Self Care, [phone] - Electrical stimulation (Manual), [id number] - Ultrasound, [phone] - Orthotic Fit, [id number] - Fluidotherapy, [id number] - Initial Splinting encounter, and [id number] - Subsequent splinting/medication    Check all conditions that are expected to impact treatment: {Conditions expected to impact treatment:Respiratory disorders and Social determinants of health   If treatment provided at initial evaluation, no treatment charged due to lack of authorization.

## 2023-01-26 ENCOUNTER — Ambulatory Visit: Payer: Medicaid Other | Admitting: Occupational Therapy

## 2023-01-26 DIAGNOSIS — R29898 Other symptoms and signs involving the musculoskeletal system: Secondary | ICD-10-CM

## 2023-01-26 DIAGNOSIS — R208 Other disturbances of skin sensation: Secondary | ICD-10-CM

## 2023-01-26 DIAGNOSIS — M79641 Pain in right hand: Secondary | ICD-10-CM

## 2023-01-26 DIAGNOSIS — M6281 Muscle weakness (generalized): Secondary | ICD-10-CM

## 2023-01-26 NOTE — Therapy (Addendum)
OUTPATIENT OCCUPATIONAL THERAPY ORTHO TREATMENT  Patient Name: Nicole Cisneros MRN: [id number] DOB:13-Jul-1970, 52 y.o., female Today's Date: 01/26/2023  PCP: Fleet Contras, MD  REFERRING PROVIDER: Samuella Cota, MD  END OF SESSION:  OT End of Session - 01/26/23 1112     Visit Number 2    Number of Visits 9    Date for OT Re-Evaluation 03/20/23    Authorization Type [place] Medicaid - requires auth    OT Start Time 1100    OT Stop Time 1142    OT Time Calculation (min) 42 min    Activity Tolerance Patient tolerated treatment well    Behavior During Therapy WFL for tasks assessed/performed              Past Medical History:  Diagnosis Date   Anemia    Constipation    COPD (chronic obstructive pulmonary disease) (HCC)    Cough    GERD (gastroesophageal reflux disease)    Headache(784.0)    Migraine    Sore throat    Weakness    Weight loss    Past Surgical History:  Procedure Laterality Date   BREAST BIOPSY Right 2022   LAPAROSCOPIC APPENDECTOMY  03/14/2011   Procedure: APPENDECTOMY LAPAROSCOPIC;  Surgeon: Kandis Cocking, MD;  Location: [facility] OR;  Service: General;  Laterality: N/A;   TUBAL LIGATION     Patient Active Problem List   Diagnosis Date Noted   Hyperlipidemia 06/07/2020   Myalgias, multiple 10/23/2014   Pain in joint, shoulder region 09/14/2014   Left knee pain 06/21/2014   GERD (gastroesophageal reflux disease) 03/14/2013   Anemia 03/14/2013   Smoking 03/14/2013   Appendicitis 03/14/2011    ONSET DATE: 01/01/2023 (date of surgery)  REFERRING DIAG: M65.311 (ICD-10-CM) - Trigger thumb, right thumb  THERAPY DIAG:  Pain in right hand  Muscle weakness (generalized)  Other disturbances of skin sensation  Other symptoms and signs involving the musculoskeletal system  Rationale for Evaluation and Treatment: Rehabilitation  SUBJECTIVE:   SUBJECTIVE STATEMENT: Pt reported completing HEP ROM exercises and reports swelling has decreased. Pt  reported continued pain. Pt continues to have difficulty with opening jars. No changes to medication and no recent falls.  Pt accompanied by: self  PERTINENT HISTORY: PMH: Anemia, L knee pain, R shoulder pain, HLD, migraine, COPD, and smoking  Pt can begin activities as tolerated with range of motion of the right thumb. Will transition to home exercises as deemed appropriate. Follow-up in approximate 4 weeks for recheck.  S/P right thumb trigger release  Needs ROM  PRECAUTIONS: Other: to tolerance  WEIGHT BEARING RESTRICTIONS: No  PAIN:  Are you having pain? Yes: NPRS scale: 6/10 Pain location: R thumb Pain description: aching Aggravating factors: stretches Relieving factors: medication; ice; heat  FALLS: Has patient fallen in last 6 months? Yes. 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  HAND DOMINANCE: Left  ADLs: Overall ADLs: mod I  FUNCTIONAL OUTCOME MEASURES: Quick Dash: 90.9 %   UPPER EXTREMITY ROM:     RUE grossly WFL  Active ROM Right eval Left eval  Thumb MCP (0-60) impaired WNL  Thumb IP (0-80) impaired WNL  Thumb Radial abd/add (0-55) impaired  WNL  Thumb Palmar abd/add (0-45) impaired  WNL  Thumb Opposition to Small Finger Lacks 2 cm  WNL  (Blank rows = not tested)   UPPER EXTREMITY MMT:     Proximal BUE WFL  HAND FUNCTION: Grip strength: Right: 13.6 (with adjusted hold and avoiding increase to pain) lbs; Left: 41.8 lbs  COORDINATION: 9 Hole Peg test: Right: 32 sec; Left: 30 sec  SENSATION: Paresthesias to R thumb  EDEMA: mild edema reported  and observed  COGNITION: Overall cognitive status: Within functional limits for tasks assessed  OBSERVATIONS: Pt appears well-kept. Bandaid donned to R thumb with hypersensitivity to light touch. Pt holding R hand in guarded position.  TODAY'S TREATMENT:                                                                                                                               TherEx 10 minutes - Fluidotherapy with gentle R hand ROM to improve ROM, decrease pain, and desensitization - thumb to finger opposition and slides, thumb opposition. OT educated pt regarding purpose of fluidotherapy, gentle ROM exercises, desensitization. Pt acknowledged understanding.  Palm to finger translation of small objects to improve R hand FM coordination and improve ROM.  Manual therapy OT completed scar massage using different materials including towel and Dycem for scar desensitization and mobilization.  Therapeutic Activities OT educated pt regarding self scar massage using different materials as tolerated, monitoring for skin breakdown, and completing scar massage 5-10 min, 2x per day then increase to 4-5x per day if pt tolerates task well. Pt verbalized and demo'd understanding. Handout provided, see pt instructions. OT provided Dycem to pt for scar massage at home.  OT educated pt regarding avoiding strengthening tasks in daily routine for now d/t recent surgery, including avoiding opening jars, turning door handles, carrying backpack, etc. Pt verbalized understanding. Continuing education recommended.   PATIENT EDUCATION: Education details: See Today's Treatment above Person educated: Patient Education method: Explanation, Verbal cues, and Handouts Education comprehension: verbalized understanding, returned demonstration, and needs further education  HOME EXERCISE PROGRAM: 01/21/2023: R thumb ROM  GOALS:  SHORT TERM GOALS: Target date: 02/20/2023    Patient will demonstrate independence with  initial RUE HEP. Baseline: not yet initiated Goal status: INITIAL  2.  Pt will be independent with scar management and desensitization techniques as needed to manage RUE pain and hypersensitivity.  Baseline: not yet initiated Goal status: INITIAL  3.  Pt will demonstrate full R opposition to little finger. Baseline: lacks 2 cm Goal status: INITIAL   LONG TERM GOALS: Target date: 03/20/2023  Patient will demonstrate updated RUE HEP with 25% verbal cues or less for proper execution. Baseline: not yet initiated Goal status: INITIAL  2.  Patient will demonstrate at least 16% improvement with quick Dash score (reporting 74.9% disability or less) indicating improved functional use of affected extremity. Baseline: 90.9% disability with use  of RUE Goal status: INITIAL  3.  Patient will demonstrate at least 30 lbs R grip strength as needed to open jars and other containers. Baseline: 13.6 lbs (with adjusted hold and avoiding increase to pain) Goal status: INITIAL   ASSESSMENT:  CLINICAL IMPRESSION: Pt tolerated tasks today well. Pt to continue targeting scar desensitization and ROM through HEP. Continuing pt education recommended to avoid strengthening tasks for now. Pt would benefit from skilled OT services in the outpatient setting to work on impairments as noted below to help pt return to PLOF as able.    PERFORMANCE DEFICITS: in functional skills including ADLs, IADLs, coordination, sensation, ROM, strength, pain, Fine motor control, and UE functional use.   IMPAIRMENTS: are limiting patient from ADLs, IADLs, rest and sleep, work, and leisure.   COMORBIDITIES: may have co-morbidities  that affects occupational performance. Patient will benefit from skilled OT to address above impairments and improve overall function.  MODIFICATION OR ASSISTANCE TO COMPLETE EVALUATION: Min-Moderate modification of tasks or assist with assess necessary to complete an evaluation.  OT OCCUPATIONAL  PROFILE AND HISTORY: Problem focused assessment: Including review of records relating to presenting problem.  CLINICAL DECISION MAKING: LOW - limited treatment options, no task modification necessary  REHAB POTENTIAL: Good  EVALUATION COMPLEXITY: Low     PLAN:  OT FREQUENCY: 1x/week  OT DURATION: 8 weeks  PLANNED INTERVENTIONS: self care/ADL training, therapeutic exercise, therapeutic activity, manual therapy, scar mobilization, passive range of motion, splinting, ultrasound, paraffin, fluidotherapy, moist heat, contrast bath, patient/family education, DME and/or AE instructions, and Re-evaluation  RECOMMENDED OTHER SERVICES: none at this time  CONSULTED AND AGREED WITH PLAN OF CARE: Patient  PLAN FOR NEXT SESSION: Review ROM HEP; review scar massage and desensitization; pain management (fluido or [place]), Begin strength training as tolerated at 4 weeks s/p   Wynetta Emery, OT 01/26/2023, 1:16 PM

## 2023-02-02 ENCOUNTER — Other Ambulatory Visit: Payer: Self-pay | Admitting: Internal Medicine

## 2023-02-02 ENCOUNTER — Ambulatory Visit: Payer: Medicaid Other | Admitting: Occupational Therapy

## 2023-02-02 DIAGNOSIS — R208 Other disturbances of skin sensation: Secondary | ICD-10-CM

## 2023-02-02 DIAGNOSIS — Z1231 Encounter for screening mammogram for malignant neoplasm of breast: Secondary | ICD-10-CM

## 2023-02-02 DIAGNOSIS — M79641 Pain in right hand: Secondary | ICD-10-CM

## 2023-02-02 DIAGNOSIS — M6281 Muscle weakness (generalized): Secondary | ICD-10-CM

## 2023-02-02 DIAGNOSIS — R29898 Other symptoms and signs involving the musculoskeletal system: Secondary | ICD-10-CM

## 2023-02-02 NOTE — Therapy (Signed)
OUTPATIENT OCCUPATIONAL THERAPY ORTHO TREATMENT  Patient Name: Nicole Cisneros MRN: [id number] DOB:1970/05/21, 52 y.o., female Today's Date: 02/02/2023  PCP: Fleet Contras, MD  REFERRING PROVIDER: Samuella Cota, MD  END OF SESSION:  OT End of Session - 02/02/23 1058     Visit Number 3    Number of Visits 9    Date for OT Re-Evaluation 03/20/23    Authorization Type [place] [employer] - requires auth    OT Start Time 1100    OT Stop Time 1143    OT Time Calculation (min) 43 min    Activity Tolerance Patient tolerated treatment well    Behavior During Therapy WFL for tasks assessed/performed               Past Medical History:  Diagnosis Date   Anemia    Constipation    COPD (chronic obstructive pulmonary disease) (HCC)    Cough    GERD (gastroesophageal reflux disease)    Headache(784.0)    Migraine    Sore throat    Weakness    Weight loss    Past Surgical History:  Procedure Laterality Date   BREAST BIOPSY Right 2022   LAPAROSCOPIC APPENDECTOMY  03/14/2011   Procedure: APPENDECTOMY LAPAROSCOPIC;  Surgeon: Kandis Cocking, MD;  Location: [facility] OR;  Service: General;  Laterality: N/A;   TUBAL LIGATION     Patient Active Problem List   Diagnosis Date Noted   Hyperlipidemia 06/07/2020   Myalgias, multiple 10/23/2014   Pain in joint, shoulder region 09/14/2014   Left knee pain 06/21/2014   GERD (gastroesophageal reflux disease) 03/14/2013   Anemia 03/14/2013   Smoking 03/14/2013   Appendicitis 03/14/2011    ONSET DATE: 01/01/2023 (date of surgery)  REFERRING DIAG: M65.311 (ICD-10-CM) - Trigger thumb, right thumb  THERAPY DIAG:  Pain in right hand  Muscle weakness (generalized)  Other disturbances of skin sensation  Other symptoms and signs involving the musculoskeletal system  Rationale for Evaluation and Treatment: Rehabilitation  SUBJECTIVE:   SUBJECTIVE STATEMENT: Pt reported pain at center of scar. Pt reports completing scar desensitization  which has been helping. Pt reported completing stretches but avoiding "overdoing it" when pain increases. Pt reports avoiding heavy strengthening exercises, such as asking another person to open jars/containers PRN.  Pt accompanied by: self  PERTINENT HISTORY: PMH: Anemia, L knee pain, R shoulder pain, HLD, migraine, COPD, and smoking  Pt can begin activities as tolerated with range of motion of the right thumb. Will transition to home exercises as deemed appropriate. Follow-up in approximate 4 weeks for recheck.  S/P right thumb trigger release  Needs ROM  PRECAUTIONS: Other: to tolerance  WEIGHT BEARING RESTRICTIONS: No  PAIN:  Are you having pain? Yes: NPRS scale: 5/10 Pain location: R thumb Pain description: aching Aggravating factors: stretches Relieving factors: medication; ice; heat  FALLS: Has patient fallen in last 6 months? Yes. 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  HAND DOMINANCE: Left  ADLs: Overall ADLs: mod I  FUNCTIONAL OUTCOME MEASURES: Quick Dash: 90.9 %   UPPER EXTREMITY ROM:     RUE grossly [facility]  Active  ROM Right eval Left eval  Thumb MCP (0-60) impaired WNL  Thumb IP (0-80) impaired WNL  Thumb Radial abd/add (0-55) impaired  WNL  Thumb Palmar abd/add (0-45) impaired  WNL  Thumb Opposition to Small Finger Lacks 2 cm  WNL  (Blank rows = not tested)   UPPER EXTREMITY MMT:     Proximal BUE WFL  HAND FUNCTION: Grip strength: Right: 13.6 (with adjusted hold and avoiding increase to pain) lbs; Left: 41.8 lbs  COORDINATION: 9 Hole Peg test:  Right: 32 sec; Left: 30 sec  SENSATION: Paresthesias to R thumb  EDEMA: mild edema reported and observed  COGNITION: Overall cognitive status: Within functional limits for tasks assessed  OBSERVATIONS: Pt appears well-kept. Bandaid donned to R thumb with hypersensitivity to light touch. Pt holding R hand in guarded position.  TODAY'S TREATMENT:                                                                                                                               Therapeutic Exercise OT noted pt held hand in "protected position" at beginning of OT session today.   Fluidotherapy with tendon glides to improve ROM, decrease pain, scar desensitization - 11 minutes timed  Manual Therapy Manual scar massage to improve scar desensitization, manage scar tissue, decrease pain. OT educated pt on scar tissue and scar desensitization strategies, pt verbalized understanding. Pt reported pain at center of scar and OT noted increased "tightness" of scar at center of scar during manual scar massage.  Therapeutic Activities  Per Dr. Jamie Kato instructions, pt s/p 4 weeks and therefore light strengthening initiated today with theraputty exercises - focus on quality movements, gentle strengthening, and avoid excessive gripping/pinching tasks. Pt verbalized understanding. Exercises (yellow theraputty) and ROM stretches to decrease pain, improve ROM, increase RUE strengthening - handout provided, see pt instrutions Exercises - Putty Squeezes  -- 1 sets - 10 reps - Tip Pinch with Putty  -1 sets - 10 reps - Finger Key Grip with Putty  -  1 sets - 10 reps - Finger Extension with Putty  - 1 sets - 10 reps - Thumb AROM Palmar Abduction to Radial Abduction  -  2 sets - 10 reps - 5 hold - Thumb AROM Radial Abduction and Adduction   2 sets - 10 reps - 5 hold - Thumb AROM Circumduction Clockwise and Counterclockwise  - 2 sets - 10 reps   PATIENT EDUCATION: Education details: avoiding heavy strengthening  (e.g. door handles, containers), begin light strengthening with theraputty HEP, healing process, scar tissue management, scar desensitization, ROM stretches, avoiding sharp pain, working within pain threshold Person educated: Patient Education method: Explanation, Verbal cues, and Handouts Education comprehension: verbalized understanding, returned demonstration, and needs further education  HOME EXERCISE PROGRAM: 01/21/2023: R thumb ROM 02/02/23: Yellow theraputty and RUE ROM HEP. Access Code: [email or web address]  GOALS:  SHORT TERM GOALS: Target date: 02/20/2023    Patient will demonstrate independence with initial RUE HEP.  Baseline: not yet initiated Goal status: INITIAL  2.  Pt will be independent with scar management and desensitization techniques as needed to manage RUE pain and hypersensitivity.  Baseline: not yet initiated Goal status: INITIAL  3.  Pt will demonstrate full R opposition to little finger. Baseline: lacks 2 cm Goal status: INITIAL   LONG TERM GOALS: Target date: 03/20/2023  Patient will demonstrate updated RUE HEP with 25% verbal cues or less for proper execution. Baseline: not yet initiated Goal status: INITIAL  2.  Patient will demonstrate at least 16% improvement with quick Dash score (reporting 74.9% disability or less) indicating improved functional use of affected extremity. Baseline: 90.9% disability with use of RUE Goal status: INITIAL  3.  Patient will demonstrate at least 30 lbs R grip strength as needed to open jars and other containers. Baseline: 13.6 lbs (with adjusted hold and avoiding increase to pain) Goal status: INITIAL   ASSESSMENT:  CLINICAL IMPRESSION: Pt tolerated tasks today well though continues to work towards scar desensitization, ROM without pain. Pt tolerated gentle strengthening tasks well today. Pt would benefit from skilled OT services in the outpatient setting to work on impairments as noted below to help pt return to PLOF as able.     PERFORMANCE DEFICITS: in functional skills including ADLs, IADLs, coordination, sensation, ROM, strength, pain, Fine motor control, and UE functional use.   IMPAIRMENTS: are limiting patient from ADLs, IADLs, rest and sleep, work, and leisure.   COMORBIDITIES: may have co-morbidities  that affects occupational performance. Patient will benefit from skilled OT to address above impairments and improve overall function.  MODIFICATION OR ASSISTANCE TO COMPLETE EVALUATION: Min-Moderate modification of tasks or assist with assess necessary to complete an evaluation.  OT OCCUPATIONAL PROFILE AND HISTORY: Problem focused assessment: Including review of records relating to presenting problem.  CLINICAL DECISION MAKING: LOW - limited treatment options, no task modification necessary  REHAB POTENTIAL: Good  EVALUATION COMPLEXITY: Low     PLAN:  OT FREQUENCY: 1x/week  OT DURATION: 8 weeks  PLANNED INTERVENTIONS: self care/ADL training, therapeutic exercise, therapeutic activity, manual therapy, scar mobilization, passive range of motion, splinting, ultrasound, paraffin, fluidotherapy, moist heat, contrast bath, patient/family education, DME and/or AE instructions, and Re-evaluation  RECOMMENDED OTHER SERVICES: none at this time  CONSULTED AND AGREED WITH PLAN OF CARE: Patient  PLAN FOR NEXT SESSION:  Review ROM and theraputty HEP Review scar massage and desensitization Progress strengthening HEP when appropriate Pain management (fluido or [place]) Manual scar massage to manage scar tissue, improve scar desensitization   Wynetta Emery, OT 02/02/2023, 1:06 PM

## 2023-02-09 ENCOUNTER — Ambulatory Visit: Payer: Medicaid Other | Admitting: Occupational Therapy

## 2023-02-09 DIAGNOSIS — R208 Other disturbances of skin sensation: Secondary | ICD-10-CM

## 2023-02-09 DIAGNOSIS — M79641 Pain in right hand: Secondary | ICD-10-CM

## 2023-02-09 DIAGNOSIS — R29898 Other symptoms and signs involving the musculoskeletal system: Secondary | ICD-10-CM

## 2023-02-09 DIAGNOSIS — M6281 Muscle weakness (generalized): Secondary | ICD-10-CM

## 2023-02-09 NOTE — Therapy (Signed)
OUTPATIENT OCCUPATIONAL THERAPY ORTHO TREATMENT  Patient Name: Nicole Cisneros MRN: [id number] DOB:Jan 22, 1971, 52 y.o., female Today's Date: 02/09/2023  PCP: Fleet Contras, MD  REFERRING PROVIDER: Samuella Cota, MD  END OF SESSION:  OT End of Session - 02/09/23 1201     Visit Number 4    Number of Visits 9    Date for OT Re-Evaluation 03/20/23    Authorization Type [place] Medicaid - requires auth    OT Start Time 1103    OT Stop Time 1143    OT Time Calculation (min) 40 min    Activity Tolerance Patient tolerated treatment well    Behavior During Therapy WFL for tasks assessed/performed                Past Medical History:  Diagnosis Date   Anemia    Constipation    COPD (chronic obstructive pulmonary disease) (HCC)    Cough    GERD (gastroesophageal reflux disease)    Headache(784.0)    Migraine    Sore throat    Weakness    Weight loss    Past Surgical History:  Procedure Laterality Date   BREAST BIOPSY Right 2022   LAPAROSCOPIC APPENDECTOMY  03/14/2011   Procedure: APPENDECTOMY LAPAROSCOPIC;  Surgeon: Kandis Cocking, MD;  Location: [facility] OR;  Service: General;  Laterality: N/A;   TUBAL LIGATION     Patient Active Problem List   Diagnosis Date Noted   Hyperlipidemia 06/07/2020   Myalgias, multiple 10/23/2014   Pain in joint, shoulder region 09/14/2014   Left knee pain 06/21/2014   GERD (gastroesophageal reflux disease) 03/14/2013   Anemia 03/14/2013   Smoking 03/14/2013   Appendicitis 03/14/2011    ONSET DATE: 01/01/2023 (date of surgery)  REFERRING DIAG: M65.311 (ICD-10-CM) - Trigger thumb, right thumb  THERAPY DIAG:  Pain in right hand  Muscle weakness (generalized)  Other disturbances of skin sensation  Other symptoms and signs involving the musculoskeletal system  Rationale for Evaluation and Treatment: Rehabilitation  SUBJECTIVE:   SUBJECTIVE STATEMENT: Pt reported will pick up "Lipitore" medication from pharmacy later today. OT  asked pt to bring label to next OT session to update medication list, pt verbalized understanding.   Pt reports continued "tightness" of R scar. 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  Pt accompanied by: self  PERTINENT HISTORY: PMH: Anemia, L knee pain, R shoulder pain, HLD, migraine, COPD, and smoking  Pt can begin activities as tolerated with range of motion of the right thumb. Will transition to home exercises as deemed appropriate. Follow-up in approximate 4 weeks for recheck.  S/P right thumb trigger release  Needs ROM  PRECAUTIONS: Other: to tolerance  WEIGHT BEARING RESTRICTIONS: No  PAIN:  Are you having pain? Yes: NPRS scale: 1/10 Pain location: R thumb Pain description: numbness Aggravating factors: overuse Relieving factors: alternate ice and heat  FALLS: Has patient fallen in last 6 months? Yes. 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  HAND DOMINANCE: Left  ADLs: Overall ADLs: mod I  FUNCTIONAL OUTCOME MEASURES: Quick Dash:  90.9 %   UPPER EXTREMITY ROM:     RUE grossly WFL  Active ROM Right eval Left eval  Thumb MCP (0-60) impaired WNL  Thumb IP (0-80) impaired WNL  Thumb Radial abd/add (0-55) impaired  WNL  Thumb Palmar abd/add (0-45) impaired  WNL  Thumb Opposition to Small Finger Lacks 2 cm  WNL  (Blank rows = not tested)   UPPER EXTREMITY MMT:     Proximal BUE WFL  HAND FUNCTION: Grip strength: Right: 13.6 (with adjusted hold and avoiding increase to pain)  lbs; Left: 41.8 lbs  COORDINATION: 9 Hole Peg test: Right: 32 sec; Left: 30 sec  SENSATION: Paresthesias to R thumb  EDEMA: mild edema reported and observed  COGNITION: Overall cognitive status: Within functional limits for tasks assessed  OBSERVATIONS: Pt appears well-kept. Bandaid donned to R thumb with hypersensitivity to light touch. Pt holding R hand in guarded position.  TODAY'S TREATMENT:                                                                                                                               Manual Therapy Manual scar massage to improve scar desensitization, manage scar tissue, decrease pain. Manual scar massage then progressed to using vibrating tool (circles, zig zag) to manage scar tissue, increase sensory input to R thumb. OT educated pt on scar tissue, hand anatomy, healing process after surgery, and scar desensitization strategies. Pt verbalized understanding. Pt reported numbness of R thumb in general though reported 5 out of 10 pain at proximal edge of scar. OT noted decreased "tightness" of scar today compared to previous session. OT recommended to pt to continue scar manual massage at home and OT provided strategies to improve efficiency. Pt demo'd understanding.  Therapeutic Activities Pt reported continuing to require assistance to open tight jars and difficulty with turning door knobs. Therefore, OT provided Dycem to improve grasp of jar lids and door knobs in order to increase pt ind with ADL/IADL tasks. Pt demo'd understanding.  OT and pt reviewed pt's HEP. OT noted pt demo'd improved R thumb AROM as evidenced by improved ability to slide thumb down length of pinky compared to previous sessions.  Pt report that yellow putty was "getting easier." Therefore, OT progressed HEP to red theraputty to increase RUE strengthening, grasp strength, coordination. Pt demo'd understanding. Pt educated to work within pain threshold, pt verbalized  understanding. Access Code: [email or web address] URL: [email or web address] Date: 02/09/2023 Prepared by: Carilyn Goodpasture  Exercises - Putty Squeezes  1 sets - 5 reps - Tip Pinch with Putty  -1 sets - 5 reps - Finger Key Grip with Putty  - 1 sets - 5 reps - Finger Extension with Putty  1 sets - 5 reps   PATIENT EDUCATION: Education details: see treatment note above Person educated: Patient Education method: Explanation, Demonstration, and Verbal cues Education comprehension: verbalized understanding, returned demonstration, and needs further education  HOME EXERCISE PROGRAM: 01/21/2023: R thumb ROM 02/02/23:  Yellow theraputty and RUE ROM HEP. Access Code: [email or web address] 02/09/23: Progressed HEP to red theraputty - Access code: same as above  GOALS:  SHORT TERM GOALS: Target date: 02/20/2023    Patient will demonstrate independence with initial RUE HEP. Baseline: not yet initiated Goal status: INITIAL  2.  Pt will be independent with scar management and desensitization techniques as needed to manage RUE pain and hypersensitivity.  Baseline: not yet initiated Goal status: INITIAL  3.  Pt will demonstrate full R opposition to little finger. Baseline: lacks 2 cm Goal status: INITIAL   LONG TERM GOALS: Target date: 03/20/2023  Patient will demonstrate updated RUE HEP with 25% verbal cues or less for proper execution. Baseline: not yet initiated Goal status: INITIAL  2.  Patient will demonstrate at least 16% improvement with quick Dash score (reporting 74.9% disability or less) indicating improved functional use of affected extremity. Baseline: 90.9% disability with use of RUE Goal status: INITIAL  3.  Patient will demonstrate at least 30 lbs R grip strength as needed to open jars and other containers. Baseline: 13.6 lbs (with adjusted hold and avoiding increase to pain) Goal status: INITIAL   ASSESSMENT:  CLINICAL IMPRESSION: Pt tolerated tasks today well. Pt tolerated  increase in theraputty  strengthening HEP tasks well today. Pt would benefit from skilled OT services in the outpatient setting to work on impairments as noted below to help pt return to PLOF as able.    PERFORMANCE DEFICITS: in functional skills including ADLs, IADLs, coordination, sensation, ROM, strength, pain, Fine motor control, and UE functional use.   IMPAIRMENTS: are limiting patient from ADLs, IADLs, rest and sleep, work, and leisure.   COMORBIDITIES: may have co-morbidities  that affects occupational performance. Patient will benefit from skilled OT to address above impairments and improve overall function.  MODIFICATION OR ASSISTANCE TO COMPLETE EVALUATION: Min-Moderate modification of tasks or assist with assess necessary to complete an evaluation.  OT OCCUPATIONAL PROFILE AND HISTORY: Problem focused assessment: Including review of records relating to presenting problem.  CLINICAL DECISION MAKING: LOW - limited treatment options, no task modification necessary  REHAB POTENTIAL: Good  EVALUATION COMPLEXITY: Low     PLAN:  OT FREQUENCY: 1x/week  OT DURATION: 8 weeks  PLANNED INTERVENTIONS: self care/ADL training, therapeutic exercise, therapeutic activity, manual therapy, scar mobilization, passive range of motion, splinting, ultrasound, paraffin, fluidotherapy, moist heat, contrast bath, patient/family education, DME and/or AE instructions, and Re-evaluation  RECOMMENDED OTHER SERVICES: none at this time  CONSULTED AND AGREED WITH PLAN OF CARE: Patient  PLAN FOR NEXT SESSION:   Progress strengthening HEP when appropriate - 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

## 2023-02-16 ENCOUNTER — Ambulatory Visit: Payer: Medicaid Other | Attending: Internal Medicine | Admitting: Occupational Therapy

## 2023-02-16 DIAGNOSIS — R278 Other lack of coordination: Secondary | ICD-10-CM | POA: Insufficient documentation

## 2023-02-16 DIAGNOSIS — R29898 Other symptoms and signs involving the musculoskeletal system: Secondary | ICD-10-CM | POA: Insufficient documentation

## 2023-02-16 DIAGNOSIS — R208 Other disturbances of skin sensation: Secondary | ICD-10-CM | POA: Insufficient documentation

## 2023-02-16 DIAGNOSIS — M6281 Muscle weakness (generalized): Secondary | ICD-10-CM | POA: Diagnosis present

## 2023-02-16 NOTE — Therapy (Unsigned)
OUTPATIENT OCCUPATIONAL THERAPY ORTHO TREATMENT  Patient Name: Nicole Cisneros MRN: [id number] DOB:06-13-1970, 52 y.o., female Today's Date: 02/16/2023  PCP: Fleet Contras, MD  REFERRING PROVIDER: Samuella Cota, MD  END OF SESSION:  OT End of Session - 02/16/23 1108     Visit Number 5    Number of Visits 9    Date for OT Re-Evaluation 03/20/23    Authorization Type [place] [employer] - Approved 13 OT visits    Authorization Time Period 01/26/2023 - 04/26/2023    Authorization - Visit Number 4    Authorization - Number of Visits 13    OT Start Time 1107    OT Stop Time 1150    OT Time Calculation (min) 43 min    Equipment Utilized During Treatment FM objects    Activity Tolerance Patient tolerated treatment well    Behavior During Therapy WFL for tasks assessed/performed                Past Medical History:  Diagnosis Date   Anemia    Constipation    COPD (chronic obstructive pulmonary disease) (HCC)    Cough    GERD (gastroesophageal reflux disease)    Headache(784.0)    Migraine    Sore throat    Weakness    Weight loss    Past Surgical History:  Procedure Laterality Date   BREAST BIOPSY Right 2022   LAPAROSCOPIC APPENDECTOMY  03/14/2011   Procedure: APPENDECTOMY LAPAROSCOPIC;  Surgeon: Kandis Cocking, MD;  Location: [facility] OR;  Service: General;  Laterality: N/A;   TUBAL LIGATION     Patient Active Problem List   Diagnosis Date Noted   Hyperlipidemia 06/07/2020   Myalgias, multiple 10/23/2014   Pain in joint, shoulder region 09/14/2014   Left knee pain 06/21/2014   GERD (gastroesophageal reflux disease) 03/14/2013   Anemia 03/14/2013   Smoking 03/14/2013   Appendicitis 03/14/2011    ONSET DATE: 01/01/2023 (date of surgery)  REFERRING DIAG: M65.311 (ICD-10-CM) - Trigger thumb, right thumb  THERAPY DIAG:  Muscle weakness (generalized)  Other lack of coordination  Other disturbances of skin sensation  Other symptoms and signs involving the  musculoskeletal system  Rationale for Evaluation and Treatment: Rehabilitation  SUBJECTIVE:   SUBJECTIVE STATEMENT: Pt picked up "Lipitor" medication from pharmacy later today.   Pt reports continued "tightness" of R scar. 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  Pt accompanied by: self  PERTINENT HISTORY: PMH: Anemia, L knee pain, R shoulder pain, HLD, migraine, COPD, and smoking  Pt can begin activities as tolerated with range of motion of the right thumb. Will transition to home exercises as deemed appropriate. Follow-up in approximate 4 weeks for recheck.  S/P right thumb trigger release  Needs ROM  PRECAUTIONS: Other: to tolerance  WEIGHT BEARING RESTRICTIONS: No  PAIN:  Are you having pain? Yes: NPRS scale: 1/10 Pain location: R thumb Pain description: numbness Aggravating factors: overuse Relieving factors: alternate ice and heat  FALLS: Has patient fallen in last 6 months? Yes. 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  HAND DOMINANCE: Left  ADLs: Overall ADLs: mod I  FUNCTIONAL OUTCOME MEASURES: Quick Dash: 90.9 %   UPPER EXTREMITY ROM:     RUE grossly WFL  Active ROM Right eval Left eval  Thumb MCP (0-60) impaired WNL  Thumb IP (0-80) impaired WNL  Thumb Radial abd/add (0-55) impaired  WNL  Thumb Palmar abd/add (0-45) impaired  WNL  Thumb Opposition to Small Finger Lacks 2 cm  WNL  (Blank rows = not tested)   UPPER EXTREMITY MMT:     Proximal BUE WFL  HAND  FUNCTION: Grip strength: Right: 13.6 (with adjusted hold and avoiding increase to pain) lbs; Left: 41.8 lbs  02/15/22 Right:  47.8, 41.8, 44.9  Left: 55.7, 59.7, 58.6  Average Right: Left: 44.8 lbs Left: 58 lbs  COORDINATION: 9 Hole Peg test: Right: 32 sec; Left: 30 sec  SENSATION: Paresthesias to R thumb  EDEMA: mild edema reported and observed  COGNITION: Overall cognitive status: Within functional limits for tasks assessed  OBSERVATIONS: Pt appears well-kept. Bandaid donned to R thumb with hypersensitivity to light touch. Pt holding R hand in guarded position.  TODAY'S TREATMENT:                                                                                                                               Manual Therapy Manual scar massage to improve scar desensitization, manage scar tissue, decrease pain. Manual scar massage then progressed to using vibrating tool (circles, zig zag) to manage scar tissue, increase sensory input to R thumb. OT educated pt on scar tissue, hand anatomy, healing process after surgery, and scar desensitization strategies. Pt verbalized understanding. Pt reported numbness of R thumb in general though reported 5 out of 10 pain at proximal edge of scar. OT noted decreased "tightness" of scar today compared to previous session. OT recommended to pt to continue scar manual massage at home and OT provided strategies to improve efficiency. Pt demo'd understanding.  Therapeutic Activities Pt reported continuing to require assistance to open tight jars and difficulty with turning door knobs. Therefore, OT provided Dycem to improve grasp of jar lids and door knobs in order to increase pt ind with ADL/IADL tasks. Pt demo'd understanding.  OT and pt reviewed pt's HEP. OT noted pt demo'd improved R thumb AROM as evidenced by improved ability to slide thumb down length of pinky compared to previous sessions.  Pt report that yellow putty was "getting easier." Therefore, OT  progressed HEP to red theraputty to increase RUE strengthening, grasp strength, coordination. Pt demo'd understanding. Pt educated to work within pain threshold, pt verbalized understanding. Access Code: [email or web address] URL: [email or web address] Date: 02/09/2023 Prepared by: Carilyn Goodpasture  Exercises - Putty Squeezes  1 sets - 5 reps - Tip Pinch with Putty  -1 sets - 5 reps - Finger Key Grip with Putty  - 1 sets - 5 reps - Finger Extension with Putty  1 sets - 5 reps  PATIENT EDUCATION: Education details: see treatment note above Person educated: Patient Education method: Explanation, Demonstration, and Verbal cues Education comprehension: verbalized understanding, returned demonstration, and needs further education  HOME EXERCISE PROGRAM: 01/21/2023: R thumb ROM 02/02/23: Yellow theraputty and RUE ROM HEP. Access Code: [email or web address] 02/09/23: Progressed HEP to red theraputty - Access code: same as above  GOALS:  SHORT TERM GOALS: Target date: 02/20/2023    Patient will demonstrate independence with initial RUE HEP. Baseline: not yet initiated Goal status: MET  2.  Pt will be independent with scar management and desensitization techniques as needed to manage RUE pain and hypersensitivity.  Baseline: not yet initiated Goal status: INITIAL  3.  Pt will demonstrate full R opposition to little finger. Baseline: lacks 2 cm Goal status: MET 02/16/23 full opposition to all joints    LONG TERM GOALS: Target date: 03/20/2023  Patient will demonstrate updated RUE HEP with 25% verbal cues or less for proper execution. Baseline: not yet initiated Goal status: INITIAL  2.  Patient will demonstrate at least 16% improvement with quick Dash score (reporting 74.9% disability or less) indicating improved functional use of affected extremity. Baseline: 90.9% disability with use of RUE Goal status: INITIAL  3.  Patient will demonstrate at least 30 lbs R grip strength as needed to open  jars and other containers. Baseline: 13.6 lbs (with adjusted hold and avoiding increase to pain) Goal status: INITIAL   ASSESSMENT:  CLINICAL IMPRESSION: Pt tolerated tasks today well. Pt tolerated increase in theraputty  strengthening HEP tasks well today. Pt would benefit from skilled OT services in the outpatient setting to work on impairments as noted below to help pt return to PLOF as able.    PERFORMANCE DEFICITS: in functional skills including ADLs, IADLs, coordination, sensation, ROM, strength, pain, Fine motor control, and UE functional use.   IMPAIRMENTS: are limiting patient from ADLs, IADLs, rest and sleep, work, and leisure.   COMORBIDITIES: may have co-morbidities  that affects occupational performance. Patient will benefit from skilled OT to address above impairments and improve overall function.  MODIFICATION OR ASSISTANCE TO COMPLETE EVALUATION: Min-Moderate modification of tasks or assist with assess necessary to complete an evaluation.  OT OCCUPATIONAL PROFILE AND HISTORY: Problem focused assessment: Including review of records relating to presenting problem.  CLINICAL DECISION MAKING: LOW - limited treatment options, no task modification necessary  REHAB POTENTIAL: Good  EVALUATION COMPLEXITY: Low     PLAN:  OT FREQUENCY: 1x/week  OT DURATION: 8 weeks  PLANNED INTERVENTIONS: self care/ADL training, therapeutic exercise, therapeutic activity, manual therapy, scar mobilization, passive range of motion, splinting, ultrasound, paraffin, fluidotherapy, moist heat, contrast bath, patient/family education, DME and/or AE instructions, and Re-evaluation  RECOMMENDED OTHER SERVICES: none at this time  CONSULTED AND AGREED WITH PLAN OF CARE: Patient  PLAN FOR NEXT SESSION:   Progress strengthening HEP when appropriate - trial simulated household cleaning tasks d/t pt's job requirements  Review ROM and theraputty HEP PRN  Pain management (fluido or [place])  Manual  scar massage to manage scar tissue   Victorino Sparrow, OT 02/16/2023, 12:41 PM

## 2023-02-16 NOTE — Patient Instructions (Signed)
Joint Protection Recommendations

## 2023-02-23 ENCOUNTER — Ambulatory Visit (INDEPENDENT_AMBULATORY_CARE_PROVIDER_SITE_OTHER): Payer: Medicaid Other | Admitting: Orthopedic Surgery

## 2023-02-23 DIAGNOSIS — Z9889 Other specified postprocedural states: Secondary | ICD-10-CM

## 2023-02-23 NOTE — Progress Notes (Signed)
   Nicole Cisneros - 52 y.o. female MRN [id number]  Date of birth: 1970/08/30  Office Visit Note: Visit Date: 02/23/2023 PCP: Fleet Contras, MD Referred by: Rema Fendt, NP  Subjective:  HPI: Nicole Cisneros is a 52 y.o. female who presents today for follow up 7 weeks status post right thumb trigger release.  She is doing well overall, no residual clicking or locking.  Is doing occupational therapy once a week with home exercises in between.  Pertinent ROS were reviewed with the patient and found to be negative unless otherwise specified above in HPI.   Assessment & Plan: Visit Diagnoses: No diagnosis found.  Plan: She has done well postoperatively.  Continue with occupational therapy and transition to home exercise program as appropriate.  Can follow-up as needed moving forward.  Follow-up: No follow-ups on file.   Meds & Orders: No orders of the defined types were placed in this encounter.  No orders of the defined types were placed in this encounter.    Procedures: No procedures performed       Objective:   Vital Signs: LMP 03/07/2019   Ortho Exam Right thumb: - Well-healed incision A1 pulley, mild scar formation without significant hypersensitivity - Appropriate 2-point discrimination distally 5 mm in both the radial and ulnar border of the thumb - Range of motion appropriate at the IP joint 0-60 degrees  Imaging: No results found.   Oland Arquette Trevor Mace, M.D. [facility] 9:38 AM

## 2023-02-26 ENCOUNTER — Ambulatory Visit: Payer: Medicaid Other | Admitting: Occupational Therapy

## 2023-02-26 ENCOUNTER — Encounter: Payer: Medicaid Other | Admitting: Occupational Therapy

## 2023-02-26 DIAGNOSIS — R278 Other lack of coordination: Secondary | ICD-10-CM

## 2023-02-26 DIAGNOSIS — M6281 Muscle weakness (generalized): Secondary | ICD-10-CM | POA: Diagnosis not present

## 2023-02-26 DIAGNOSIS — R208 Other disturbances of skin sensation: Secondary | ICD-10-CM

## 2023-02-26 DIAGNOSIS — R29898 Other symptoms and signs involving the musculoskeletal system: Secondary | ICD-10-CM

## 2023-02-26 NOTE — Therapy (Signed)
OUTPATIENT OCCUPATIONAL THERAPY ORTHO TREATMENT AND DISCHARGE  Patient Name: Nicole Cisneros MRN: [id number] DOB:Sep 16, 1970, 52 y.o., female Today's Date: 02/26/2023  PCP: Fleet Contras, MD  REFERRING PROVIDER: Samuella Cota, MD  OCCUPATIONAL THERAPY DISCHARGE SUMMARY  Visits from Start of Care: 6  Current functional level related to goals / functional outcomes: Pt met 100% of STG and LTG. Pt reported functional use of RUE during daily tasks and work tasks.   Remaining deficits: Scar tissue management and scar remodeling   Education / Equipment: Scar tissue management, scar remodeling, theraputty and AROM HEP  Patient agrees to discharge. Patient goals were met. Patient is being discharged due to being pleased with the current functional level and pt meeting stated rehab goals.     END OF SESSION:  OT End of Session - 02/26/23 1200     Visit Number 6    Number of Visits 9    Date for OT Re-Evaluation 03/20/23    Authorization Type [place] Medicaid - Approved 13 OT visits    Authorization Time Period 01/26/2023 - 04/26/2023    Authorization - Number of Visits 13    OT Start Time 1105    OT Stop Time 1145    OT Time Calculation (min) 40 min    Activity Tolerance Patient tolerated treatment well    Behavior During Therapy WFL for tasks assessed/performed                 Past Medical History:  Diagnosis Date   Anemia    Constipation    COPD (chronic obstructive pulmonary disease) (HCC)    Cough    GERD (gastroesophageal reflux disease)    Headache(784.0)    Migraine    Sore throat    Weakness    Weight loss    Past Surgical History:  Procedure Laterality Date   BREAST BIOPSY Right 2022   LAPAROSCOPIC APPENDECTOMY  03/14/2011   Procedure: APPENDECTOMY LAPAROSCOPIC;  Surgeon: Kandis Cocking, MD;  Location: [facility] OR;  Service: General;  Laterality: N/A;   TUBAL LIGATION     Patient Active Problem List   Diagnosis Date Noted   Hyperlipidemia 06/07/2020    Myalgias, multiple 10/23/2014   Pain in joint, shoulder region 09/14/2014   Left knee pain 06/21/2014   GERD (gastroesophageal reflux disease) 03/14/2013   Anemia 03/14/2013   Smoking 03/14/2013   Appendicitis 03/14/2011    ONSET DATE: 01/01/2023 (date of surgery)  REFERRING DIAG: M65.311 (ICD-10-CM) - Trigger thumb, right thumb  THERAPY DIAG:  Muscle weakness (generalized)  Other lack of coordination  Other disturbances of skin sensation  Other symptoms and signs involving the musculoskeletal system  Rationale for Evaluation and Treatment: Rehabilitation  SUBJECTIVE:   SUBJECTIVE STATEMENT: Pt reported seeing Dr. Fara Boros on Monday who stated everything is healing well and to continue with therapy until completed. Pt expressed concerns about "rough" feeling of scar though denies pain at site of scar unless overdoing exercises/movements. Pt reports using Dycem to open jars/containers, which has helped. Pt reports increase to red theraputty has been going well. Pt reports she has returned to work and completing household chores, which have been going "really really well."  Pt accompanied by: self  PERTINENT HISTORY: PMH: Anemia, L knee pain, R shoulder pain, HLD, migraine, COPD, and smoking  Pt can begin activities as tolerated with range of motion of the right thumb. Will transition to home exercises as deemed appropriate. Follow-up in approximate 4 weeks for recheck.  S/P right thumb trigger  release  Needs ROM  PRECAUTIONS: Other: to tolerance  WEIGHT BEARING RESTRICTIONS: No  PAIN:  Are you having pain? Yes: NPRS scale: 3/10 Pain location: R thumb Pain description: numbness Aggravating factors: overuse Relieving factors: alternate ice and heat  FALLS: Has patient fallen in last 6 months? Yes. 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  HAND DOMINANCE: Left  ADLs: Overall ADLs: mod I  FUNCTIONAL OUTCOME MEASURES: Quick Dash: 90.9 %    02/26/23 update:    UPPER EXTREMITY ROM:     RUE grossly WFL  Active ROM Right eval Left eval  Thumb MCP (0-60) impaired WNL  Thumb IP (0-80) impaired WNL  Thumb Radial abd/add (0-55) impaired  WNL  Thumb Palmar abd/add (0-45) impaired  WNL  Thumb Opposition to Small Finger Lacks 2 cm  WNL  (Blank rows = not tested)   UPPER EXTREMITY MMT:     Proximal BUE WFL  HAND FUNCTION: Grip strength: Right: 13.6 (with adjusted hold and avoiding increase to pain) lbs; Left: 41.8 lbs  02/15/22 Right:  47.8, 41.8, 44.9  Left: 55.7, 59.7, 58.6  Average Right: 44.8 lbs Left: 58 lbs  COORDINATION: 9 Hole Peg test: Right: 32 sec; Left: 30 sec  SENSATION: Paresthesias to R thumb  EDEMA: mild edema reported and observed  COGNITION: Overall cognitive status: Within functional limits for tasks assessed  OBSERVATIONS: Pt appears well-kept. Bandaid donned to R thumb with hypersensitivity to light touch. Pt holding R hand in guarded position.  TODAY'S TREATMENT:                                                                                                                               TherEx Fluidotherapy with finger opposition and slides from distal to proximal digits - 6 minutes timed - to improve AROM, decrease pain, desensitization.  Therapeutic Activities Assessed progress towards goals, see below.  Pt participated in simulated work tasks, including sweeping, mopping, pushing/pulling a heavy cart. Pt reported no pain or discomfort at R thumb with any tasks. Pt confirmed no additional concerns about using RUE  during daily/work tasks.  Pt reports feeling confident today and has no remaining questions/concerns for therapy. Pt reports not feeling limited in any activity at work, home, and in the community. OT educated pt on excellent progress towards goals. Pt verbalized understanding.   OT and pt discussed D/C from OT today d/t pt making excellent progress towards goals and demo'ing functional use of RUE throughout daily tasks. Pt agreed with D/C from OT and confirmed no additional questions or concerns at this  time. OT recommended to pt to continue with HEP, especially scar massage and desensitization, after D/C from therapy. OT provided additional copies of theraputty and AROM HEP. Pt acknowledged understanding of all.   Manual Therapy Manual scar massage to improve scar desensitization, manage scar tissue, decrease pain. Manual scar massage education continued re: circles, zig zag and cross friction as to soften scar tissue and provided increases sensory input to R thumb. OT noted pt's R thumb scar was still somewhat "tight" though greatly improved compared to previous sessions. OT recommended to pt to continue scar manual massage at home including Dycem and cylindrical roll she was provided and can use to soften and desensitize scar. Pt verbalized understanding.    PATIENT EDUCATION: Education details: Scar massage, scar remodeling, scar tissue, OT D/C, review of HEP and continuing HEP after therapy Person educated: Patient Education method: Explanation, Demonstration, and Verbal cues Education comprehension: verbalized understanding, returned demonstration, and verbal cues required  HOME EXERCISE PROGRAM: 01/21/2023: R thumb ROM 02/02/23: Yellow theraputty and RUE ROM HEP. Access Code: [email or web address] 02/09/23: Progressed HEP to red theraputty - Access code: same as above 02/26/23 - Continue with theraputty, ROM, and scar massage HEP  GOALS:  SHORT TERM GOALS: Target date: 02/20/2023    Patient will  demonstrate independence with initial RUE HEP. Baseline: not yet initiated Goal status: MET  2.  Pt will be independent with scar management and desensitization techniques as needed to manage RUE pain and hypersensitivity.  Baseline: not yet initiated 02/26/23 - Pt demo'd understanding of scar desensitization strategies and scar massage. Goal status: 02/26/23 - MET  3.  Pt will demonstrate full R opposition to little finger. Baseline: lacks 2 cm Goal status: MET 02/16/23 full opposition to all joints    LONG TERM GOALS: Target date: 03/20/2023  Patient will demonstrate updated RUE HEP with 25% verbal cues or less for proper execution. Baseline: not yet initiated 02/26/23 - Pt demo'd understanding of HEP with only visual handout. Goal status: 02/26/23 - MET  2.  Patient will demonstrate at least 16% improvement with quick Dash score (reporting 74.9% disability or less) indicating improved functional use of affected extremity. Baseline: 90.9% disability with use of RUE 02/26/23 - 15.9% deficit (see objective measures for specific details Goal status: 02/26/23- MET  3.  Patient will demonstrate at least 30 lbs R grip strength as needed to open jars and other containers. Baseline: 13.6 lbs (with adjusted hold and avoiding increase to pain) Goal status: MET 02/16/23 Average Right: 44.8 lbs 02/26/23 - Right - 51.3 lbs   ASSESSMENT:  CLINICAL IMPRESSION: Pt tolerated treatment well. Pt met 100% of LTG and STG. Pt demo'ing improved functional use of RUE, decreased pain of R thumb, improved AROM of R thumb, and improved grip strength of RUE. Pt to D/C from OT today d/t pt making excellent progress towards goals and demo'ing functional use of RUE throughout daily tasks. Pt agreed with D/C from OT and confirmed no additional questions or concerns at this time. OT recommended to pt to continue with HEP, including scar massage and desensitization, after D/C from therapy. Pt verbalized  understanding. PERFORMANCE DEFICITS: in functional skills including ADLs, IADLs, coordination, sensation, ROM, strength, pain, Fine motor control, and UE functional use.   IMPAIRMENTS: are limiting patient from ADLs, IADLs, rest and sleep, work, and leisure.   COMORBIDITIES: may have co-morbidities  that affects occupational performance. Patient will benefit from skilled OT to address above impairments and improve overall function.  REHAB POTENTIAL: Good  PLAN:  OT FREQUENCY: 1x/week  OT DURATION: 8 weeks  PLANNED INTERVENTIONS: self care/ADL training, therapeutic exercise, therapeutic activity, manual therapy, scar mobilization, passive range of motion, splinting, ultrasound, paraffin, fluidotherapy, moist heat, contrast bath, patient/family education, DME and/or AE instructions, and Re-evaluation  RECOMMENDED OTHER SERVICES: none at this time  CONSULTED AND AGREED WITH PLAN OF CARE: Patient  PLAN FOR NEXT SESSION:   N/A - Pt to D/C from OT today.   Wynetta Emery, OT 02/26/2023, 12:22 PM

## 2023-03-02 ENCOUNTER — Encounter: Payer: Medicaid Other | Admitting: Occupational Therapy

## 2023-03-09 ENCOUNTER — Encounter: Payer: Medicaid Other | Admitting: Occupational Therapy

## 2023-03-16 ENCOUNTER — Ambulatory Visit
Admission: RE | Admit: 2023-03-16 | Discharge: 2023-03-16 | Disposition: A | Discharge status: Home / Self Care | Payer: Medicaid Other | Source: Ambulatory Visit | Attending: Internal Medicine | Admitting: Internal Medicine

## 2023-03-16 ENCOUNTER — Encounter: Payer: Medicaid Other | Admitting: Occupational Therapy

## 2023-03-16 DIAGNOSIS — Z1231 Encounter for screening mammogram for malignant neoplasm of breast: Secondary | ICD-10-CM

## 2023-07-28 ENCOUNTER — Other Ambulatory Visit (INDEPENDENT_AMBULATORY_CARE_PROVIDER_SITE_OTHER)

## 2023-07-28 ENCOUNTER — Ambulatory Visit: Admitting: Sports Medicine

## 2023-07-28 ENCOUNTER — Encounter: Payer: Self-pay | Admitting: Sports Medicine

## 2023-07-28 DIAGNOSIS — Z9889 Other specified postprocedural states: Secondary | ICD-10-CM | POA: Diagnosis not present

## 2023-07-28 DIAGNOSIS — G5603 Carpal tunnel syndrome, bilateral upper limbs: Secondary | ICD-10-CM | POA: Diagnosis not present

## 2023-07-28 DIAGNOSIS — M79642 Pain in left hand: Secondary | ICD-10-CM

## 2023-07-28 MED ORDER — METHYLPREDNISOLONE 4 MG PO TBPK: ORAL_TABLET | ORAL | 0 refills | Status: AC

## 2023-07-28 NOTE — Progress Notes (Unsigned)
 Nicole Cisneros - 53 y.o. female MRN [id number]  Date of birth: 1970-05-01  Office Visit Note: Visit Date: 07/28/2023 PCP: Fleet Contras, MD Referred by: Fleet Contras, MD  Subjective: Chief Complaint  Patient presents with   Left Hand - Pain   HPI: Nicole Cisneros is a pleasant 53 y.o. female who presents today for left > right hand pain with cramping.  She is status post right thumb trigger release back in the end of September 2024.   Pertinent ROS were reviewed with the patient and found to be negative unless otherwise specified above in HPI.   Assessment & Plan: Visit Diagnoses:  1. Bilateral carpal tunnel syndrome   2. Pain in left hand   3. S/P trigger finger release     Plan: ***  Follow-up: Return for f/u with Dr. Fara Boros after nerve conduction studies with [facility].   Meds & Orders:  Meds ordered this encounter  Medications   methylPREDNISolone (MEDROL DOSEPAK) 4 MG TBPK tablet    Sig: Take per packet instruction. Taper dosing.    Dispense:  1 each    Refill:  0    Orders Placed This Encounter  Procedures   XR Hand Complete Left     Procedures: No procedures performed      Clinical History: No specialty comments available.  She reports that she has been smoking cigarettes. She has a 6 pack-year smoking history. She has never used smokeless tobacco. No results for input(s): "HGBA1C", "LABURIC" in the last 8760 hours.  Objective:   Vital Signs: LMP 03/07/2019   Physical Exam  Gen: Well-appearing, in no acute distress; non-toxic CV: Well-perfused. Warm.  Resp: Breathing unlabored on room air; no wheezing. Psych: Fluid speech in conversation; appropriate affect; normal thought process  Ortho Exam - ***  Imaging: No results found.  Past Medical/Family/Surgical/Social History: Medications & Allergies reviewed per EMR, new medications updated. Patient Active Problem List   Diagnosis Date Noted   Hyperlipidemia 06/07/2020   Myalgias, multiple  10/23/2014   Pain in joint, shoulder region 09/14/2014   Left knee pain 06/21/2014   GERD (gastroesophageal reflux disease) 03/14/2013   Anemia 03/14/2013   Smoking 03/14/2013   Appendicitis 03/14/2011   Past Medical History:  Diagnosis Date   Anemia    Constipation    COPD (chronic obstructive pulmonary disease) (HCC)    Cough    GERD (gastroesophageal reflux disease)    Headache(784.0)    Migraine    Sore throat    Weakness    Weight loss    Family History  Problem Relation Age of Onset   Heart disease Mother    Hypertension Mother    Rheum arthritis Mother    Gout Mother    Cancer Father        bone   Rheum arthritis Father    Breast cancer Sister    Cancer Sister        breast   Heart disease Sister    Arthritis Sister    Epilepsy Brother    Heart Problems Brother    Arthritis Brother    Arthritis Brother    Arthritis Brother    Asthma Son    Asthma Daughter    Past Surgical History:  Procedure Laterality Date   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   LAPAROSCOPIC APPENDECTOMY  03/14/2011   Procedure: APPENDECTOMY LAPAROSCOPIC;  Surgeon: Kandis Cocking, MD;  Location: [facility] OR;  Service: General;  Laterality: N/A;   TUBAL  LIGATION     Social History   Occupational History   Occupation: housekeeping  Tobacco Use   Smoking status: Every Day    Current packs/day: 0.50    Average packs/day: 0.5 packs/day for 12.0 years (6.0 ttl pk-yrs)    Types: Cigarettes   Smokeless tobacco: Never   Tobacco comments:    Smoking 1 ppd  Vaping Use   Vaping status: Never Used  Substance and Sexual Activity   Alcohol use: No    Alcohol/week: 0.0 standard drinks of alcohol   Drug use: No   Sexual activity: Not Currently    Birth control/protection: Post-menopausal

## 2023-07-28 NOTE — Progress Notes (Unsigned)
 Patient says that she has been having pain and cramping in the left hand for a couple of weeks now. She says that this does not go higher than the wrist. She began having this cramping in the right hand on Sunday and says that this feeling will go up to the elbow on the right arm, but never past the elbow towards the shoulder. She says that she has been taking her Celebrex twice a day to try to relieve her pain. She says that she is also having pain in the right hand and has been trying to do her exercises that she learned after surgery, but is having difficulty with those.

## 2023-07-29 ENCOUNTER — Encounter: Payer: Self-pay | Admitting: Sports Medicine

## 2023-08-07 ENCOUNTER — Ambulatory Visit: Admitting: Physical Medicine and Rehabilitation

## 2023-08-07 DIAGNOSIS — R202 Paresthesia of skin: Secondary | ICD-10-CM | POA: Diagnosis not present

## 2023-08-07 NOTE — Progress Notes (Signed)
 Patient says that she is still having pain and cramping in both hands, with left worse than right. She says that she did not get much relief from the Dosepak that she was prescribed at her last visit with Dr. Vaughn Georges.

## 2023-08-10 NOTE — Procedures (Unsigned)
 EMG & NCV Findings: Evaluation of the left radial motor nerve showed prolonged distal onset latency (3.0 ms).  The left median (across palm) sensory and the right median (across palm) sensory nerves showed no response (Palm).  All remaining nerves (as indicated in the following tables) were within normal limits.  All left vs. right side differences were within normal limits.    All examined muscles (as indicated in the following table) showed no evidence of electrical instability.    Impression: Essentially NORMAL electrodiagnostic study of both upper limbs.  There is no significant electrodiagnostic evidence of nerve entrapment, brachial plexopathy or cervical radiculopathy.    As you know, purely sensory or demyelinating radiculopathies and chemical radiculitis may not be detected with this particular electrodiagnostic study. **This electrodiagnostic study cannot rule out small fiber polyneuropathy and dysesthesias from central pain syndromes such as stroke or central pain sensitization syndromes such as fibromyalgia.  Myotomal referral pain from trigger points is also not excluded.  Recommendations: 1.  Follow-up with referring physician. 2.  Continue current management of symptoms.  ___________________________ Nicole Cisneros FAAPMR Board Certified, American Board of Physical Medicine and Rehabilitation    Nerve Conduction Studies Anti Sensory Summary Table   Stim Site NR Peak (ms) Norm Peak (ms) P-T Amp (V) Norm P-T Amp Site1 Site2 Delta-P (ms) Dist (cm) Vel (m/s) Norm Vel (m/s)  Left Median Acr Palm Anti Sensory (2nd Digit)  31.6C  Wrist    3.1 <3.6 20.7 >10 Wrist Palm  0.0    Palm *NR  <2.0          Right Median Acr Palm Anti Sensory (2nd Digit)  32.1C  Wrist    3.4 <3.6 23.7 >10 Wrist Palm  0.0    Palm *NR  <2.0          Left Radial Anti Sensory (Base 1st Digit)  31.3C  Wrist    2.0 <3.1 32.5  Wrist Base 1st Digit 2.0 0.0    Right Radial Anti Sensory (Base 1st Digit)  31.8C   Wrist    2.1 <3.1 23.4  Wrist Base 1st Digit 2.1 0.0    Left Ulnar Anti Sensory (5th Digit)  32.3C  Wrist    2.9 <3.7 22.3 >15.0 Wrist 5th Digit 2.9 14.0 48 >38  Right Ulnar Anti Sensory (5th Digit)  32.5C  Wrist    3.0 <3.7 31.0 >15.0 Wrist 5th Digit 3.0 14.0 47 >38   Motor Summary Table   Stim Site NR Onset (ms) Norm Onset (ms) O-P Amp (mV) Norm O-P Amp Site1 Site2 Delta-0 (ms) Dist (cm) Vel (m/s) Norm Vel (m/s)  Left Median Motor (Abd Poll Brev)  31.8C  Wrist    2.9 <4.2 11.0 >5 Elbow Wrist 3.6 21.0 58 >50  Elbow    6.5  10.8         Right Median Motor (Abd Poll Brev)  31.8C  Wrist    3.4 <4.2 10.2 >5 Elbow Wrist 3.9 22.3 57 >50  Elbow    7.3  10.0         Left Radial Motor (Ext Indicis)  32C  8cm    *3.0 <2.5 11.4 >1.7 Up Arm 8cm 3.1 20.0 65 >60  Up Arm    6.1  10.9  Axilla Up Arm 1.2 10.0 83   Axilla    7.3  10.6         Right Ulnar Motor (Abd Dig Min)  32C  Wrist    2.7 <4.2 10.8 >3  B Elbow Wrist 3.6 21.0 58 >53  B Elbow    6.3  12.3  A Elbow B Elbow 1.3 10.0 77 >53  A Elbow    7.6  11.7          EMG   Side Muscle Nerve Root Ins Act Fibs Psw Amp Dur Poly Recrt Int Deatra Face Comment  Left Abd Poll Brev Median C8-T1 Nml Nml Nml Nml Nml 0 Nml Nml   Left 1stDorInt Ulnar C8-T1 Nml Nml Nml Nml Nml 0 Nml Nml   Left PronatorTeres Median C6-7 Nml Nml Nml Nml Nml 0 Nml Nml   Left Biceps Musculocut C5-6 Nml Nml Nml Nml Nml 0 Nml Nml   Left Deltoid Axillary C5-6 Nml Nml Nml Nml Nml 0 Nml Nml     Nerve Conduction Studies Anti Sensory Left/Right Comparison   Stim Site L Lat (ms) R Lat (ms) L-R Lat (ms) L Amp (V) R Amp (V) L-R Amp (%) Site1 Site2 L Vel (m/s) R Vel (m/s) L-R Vel (m/s)  Median Acr Palm Anti Sensory (2nd Digit)  31.6C  Wrist 3.1 3.4 0.3 20.7 23.7 12.7 Wrist Palm     Palm             Radial Anti Sensory (Base 1st Digit)  31.3C  Wrist 2.0 2.1 0.1 32.5 23.4 28.0 Wrist Base 1st Digit     Ulnar Anti Sensory (5th Digit)  32.3C  Wrist 2.9 3.0 0.1 22.3 31.0 28.1 Wrist  5th Digit 48 47 1   Motor Left/Right Comparison   Stim Site L Lat (ms) R Lat (ms) L-R Lat (ms) L Amp (mV) R Amp (mV) L-R Amp (%) Site1 Site2 L Vel (m/s) R Vel (m/s) L-R Vel (m/s)  Median Motor (Abd Poll Brev)  31.8C  Wrist 2.9 3.4 0.5 11.0 10.2 7.3 Elbow Wrist 58 57 1  Elbow 6.5 7.3 0.8 10.8 10.0 7.4       Radial Motor (Ext Indicis)  32C  8cm *3.0   11.4   Up Arm 8cm 65    Up Arm 6.1   10.9   Axilla Up Arm 83    Axilla 7.3   10.6         Ulnar Motor (Abd Dig Min)  32C  Wrist  2.7   10.8  B Elbow Wrist  58   B Elbow  6.3   12.3  A Elbow B Elbow  77   A Elbow  7.6   11.7           Waveforms:

## 2023-08-12 NOTE — Progress Notes (Signed)
 Nicole Cisneros - 53 y.o. female MRN [id number]  Date of birth: [birth year]  Office Visit Note: Visit Date: 08/07/2023 PCP: Charle Congo, MD Referred by: Shauna Del, DO  Subjective: Chief Complaint  Patient presents with   Bilateral upper extremity NCV with EMG   HPI:  Nicole Cisneros is a 53 y.o. female who comes in today at the request of Dr. Shauna Del for evaluation and management of chronic, worsening and severe pain, numbness and tingling in the Bilateral upper extremities.  She has been followed by Merrill Abide, MD in our office for trigger thumb status post trigger thumb release.  She had been followed by Dr. Vaughn Georges in the past for this with injections that were beneficial initially.  Dr. Vaughn Georges saw her recently and thought she may have some level of carpal tunnel syndrome bilaterally.  She has not had any prior electrodiagnostic studies.   ROS Otherwise per HPI.  Assessment & Plan: Visit Diagnoses:    ICD-10-CM   1. Paresthesia of skin  R20.2 NCV with EMG (electromyography)      Plan: Impression: Essentially NORMAL electrodiagnostic study of both upper limbs.  There is no significant electrodiagnostic evidence of nerve entrapment, brachial plexopathy or cervical radiculopathy.    As you know, purely sensory or demyelinating radiculopathies and chemical radiculitis may not be detected with this particular electrodiagnostic study. **This electrodiagnostic study cannot rule out small fiber polyneuropathy and dysesthesias from central pain syndromes such as stroke or central pain sensitization syndromes such as fibromyalgia.  Myotomal referral pain from trigger points is also not excluded.  Recommendations: 1.  Follow-up with referring physician. 2.  Continue current management of symptoms.  Meds & Orders: No orders of the defined types were placed in this encounter.   Orders Placed This Encounter  Procedures   NCV with EMG (electromyography)    Follow-up: Return  for Shauna Del, DO.   Procedures: No procedures performed  EMG & NCV Findings: Evaluation of the left radial motor nerve showed prolonged distal onset latency (3.0 ms).  The left median (across palm) sensory and the right median (across palm) sensory nerves showed no response (Palm).  All remaining nerves (as indicated in the following tables) were within normal limits.  All left vs. right side differences were within normal limits.    All examined muscles (as indicated in the following table) showed no evidence of electrical instability.    Impression: Essentially NORMAL electrodiagnostic study of both upper limbs.  There is no significant electrodiagnostic evidence of nerve entrapment, brachial plexopathy or cervical radiculopathy.    As you know, purely sensory or demyelinating radiculopathies and chemical radiculitis may not be detected with this particular electrodiagnostic study. **This electrodiagnostic study cannot rule out small fiber polyneuropathy and dysesthesias from central pain syndromes such as stroke or central pain sensitization syndromes such as fibromyalgia.  Myotomal referral pain from trigger points is also not excluded.  Recommendations: 1.  Follow-up with referring physician. 2.  Continue current management of symptoms.  ___________________________ Collin Deal FAAPMR Board Certified, American Board of Physical Medicine and Rehabilitation    Nerve Conduction Studies Anti Sensory Summary Table   Stim Site NR Peak (ms) Norm Peak (ms) P-T Amp (V) Norm P-T Amp Site1 Site2 Delta-P (ms) Dist (cm) Vel (m/s) Norm Vel (m/s)  Left Median Acr Palm Anti Sensory (2nd Digit)  31.6C  Wrist    3.1 <3.6 20.7 >10 Wrist Palm  0.0    Palm *NR  <2.0  Right Median Acr Palm Anti Sensory (2nd Digit)  32.1C  Wrist    3.4 <3.6 23.7 >10 Wrist Palm  0.0    Palm *NR  <2.0          Left Radial Anti Sensory (Base 1st Digit)  31.3C  Wrist    2.0 <3.1 32.5  Wrist Base 1st Digit  2.0 0.0    Right Radial Anti Sensory (Base 1st Digit)  31.8C  Wrist    2.1 <3.1 23.4  Wrist Base 1st Digit 2.1 0.0    Left Ulnar Anti Sensory (5th Digit)  32.3C  Wrist    2.9 <3.7 22.3 >15.0 Wrist 5th Digit 2.9 14.0 48 >38  Right Ulnar Anti Sensory (5th Digit)  32.5C  Wrist    3.0 <3.7 31.0 >15.0 Wrist 5th Digit 3.0 14.0 47 >38   Motor Summary Table   Stim Site NR Onset (ms) Norm Onset (ms) O-P Amp (mV) Norm O-P Amp Site1 Site2 Delta-0 (ms) Dist (cm) Vel (m/s) Norm Vel (m/s)  Left Median Motor (Abd Poll Brev)  31.8C  Wrist    2.9 <4.2 11.0 >5 Elbow Wrist 3.6 21.0 58 >50  Elbow    6.5  10.8         Right Median Motor (Abd Poll Brev)  31.8C  Wrist    3.4 <4.2 10.2 >5 Elbow Wrist 3.9 22.3 57 >50  Elbow    7.3  10.0         Left Radial Motor (Ext Indicis)  32C  8cm    *3.0 <2.5 11.4 >1.7 Up Arm 8cm 3.1 20.0 65 >60  Up Arm    6.1  10.9  Axilla Up Arm 1.2 10.0 83   Axilla    7.3  10.6         Right Ulnar Motor (Abd Dig Min)  32C  Wrist    2.7 <4.2 10.8 >3 B Elbow Wrist 3.6 21.0 58 >53  B Elbow    6.3  12.3  A Elbow B Elbow 1.3 10.0 77 >53  A Elbow    7.6  11.7          EMG   Side Muscle Nerve Root Ins Act Fibs Psw Amp Dur Poly Recrt Int Deatra Face Comment  Left Abd Poll Brev Median C8-T1 Nml Nml Nml Nml Nml 0 Nml Nml   Left 1stDorInt Ulnar C8-T1 Nml Nml Nml Nml Nml 0 Nml Nml   Left PronatorTeres Median C6-7 Nml Nml Nml Nml Nml 0 Nml Nml   Left Biceps Musculocut C5-6 Nml Nml Nml Nml Nml 0 Nml Nml   Left Deltoid Axillary C5-6 Nml Nml Nml Nml Nml 0 Nml Nml     Nerve Conduction Studies Anti Sensory Left/Right Comparison   Stim Site L Lat (ms) R Lat (ms) L-R Lat (ms) L Amp (V) R Amp (V) L-R Amp (%) Site1 Site2 L Vel (m/s) R Vel (m/s) L-R Vel (m/s)  Median Acr Palm Anti Sensory (2nd Digit)  31.6C  Wrist 3.1 3.4 0.3 20.7 23.7 12.7 Wrist Palm     Palm             Radial Anti Sensory (Base 1st Digit)  31.3C  Wrist 2.0 2.1 0.1 32.5 23.4 28.0 Wrist Base 1st Digit     Ulnar Anti  Sensory (5th Digit)  32.3C  Wrist 2.9 3.0 0.1 22.3 31.0 28.1 Wrist 5th Digit 48 47 1   Motor Left/Right Comparison   Stim Site L Lat (ms) R Lat (  ms) L-R Lat (ms) L Amp (mV) R Amp (mV) L-R Amp (%) Site1 Site2 L Vel (m/s) R Vel (m/s) L-R Vel (m/s)  Median Motor (Abd Poll Brev)  31.8C  Wrist 2.9 3.4 0.5 11.0 10.2 7.3 Elbow Wrist 58 57 1  Elbow 6.5 7.3 0.8 10.8 10.0 7.4       Radial Motor (Ext Indicis)  32C  8cm *3.0   11.4   Up Arm 8cm 65    Up Arm 6.1   10.9   Axilla Up Arm 83    Axilla 7.3   10.6         Ulnar Motor (Abd Dig Min)  32C  Wrist  2.7   10.8  B Elbow Wrist  58   B Elbow  6.3   12.3  A Elbow B Elbow  77   A Elbow  7.6   11.7           Waveforms:                     Clinical History: 07/28/2023 Left Xray 3 views of the left hand including AP, oblique and lateral film were  ordered and reviewed by myself.  X-rays demonstrates mild arthritic change  throughout the hand and digits.  No acute fracture or otherwise acute bony  abnormality noted.  -----  12/22/2022 Right Hand Xray X-rays of the right hand, multiple views were obtained today  X-rays demonstrate appropriate appearance of the radiocarpal and midcarpal  intervals without significant bony abnormalities.  Bone mineralization is  appropriate.     Objective:  VS:  HT:    WT:   BMI:     BP:   HR: bpm  TEMP: ( )  RESP:  Physical Exam Musculoskeletal:        General: Tenderness present. No swelling or deformity.     Comments: Inspection reveals no atrophy of the bilateral APB or FDI or hand intrinsics. There is no swelling, color changes, allodynia or dystrophic changes. There is 5 out of 5 strength in the bilateral wrist extension, finger abduction and long finger flexion. There is intact sensation to light touch in all dermatomal and peripheral nerve distributions. There is a negative Froment's test bilaterally. There is a negative Phalen's test bilaterally. There is a negative Hoffmann's test  bilaterally.  Skin:    General: Skin is warm and dry.     Findings: No erythema or rash.  Neurological:     General: No focal deficit present.     Mental Status: She is alert and oriented to person, place, and time.     Cranial Nerves: No cranial nerve deficit.     Sensory: No sensory deficit.     Motor: No weakness or abnormal muscle tone.     Coordination: Coordination normal.     Gait: Gait normal.  Psychiatric:        Mood and Affect: Mood normal.        Behavior: Behavior normal.      Imaging: No results found.

## 2023-08-15 ENCOUNTER — Emergency Department (HOSPITAL_COMMUNITY)

## 2023-08-15 ENCOUNTER — Other Ambulatory Visit: Payer: Self-pay

## 2023-08-15 ENCOUNTER — Emergency Department (HOSPITAL_COMMUNITY)
Admission: EM | Admit: 2023-08-15 | Discharge: 2023-08-15 | Disposition: A | Discharge status: Home / Self Care | Attending: Emergency Medicine | Admitting: Emergency Medicine

## 2023-08-15 ENCOUNTER — Encounter (HOSPITAL_COMMUNITY): Payer: Self-pay | Admitting: Emergency Medicine

## 2023-08-15 DIAGNOSIS — J40 Bronchitis, not specified as acute or chronic: Secondary | ICD-10-CM | POA: Insufficient documentation

## 2023-08-15 DIAGNOSIS — R059 Cough, unspecified: Secondary | ICD-10-CM | POA: Diagnosis present

## 2023-08-15 DIAGNOSIS — J449 Chronic obstructive pulmonary disease, unspecified: Secondary | ICD-10-CM | POA: Diagnosis not present

## 2023-08-15 LAB — CBC
HCT: 39.8 % (ref 36.0–46.0)
Hemoglobin: 13 g/dL (ref 12.0–15.0)
MCH: 29.2 pg (ref 26.0–34.0)
MCHC: 32.7 g/dL (ref 30.0–36.0)
MCV: 89.4 fL (ref 80.0–100.0)
Platelets: 218 10*3/uL (ref 150–400)
RBC: 4.45 MIL/uL (ref 3.87–5.11)
RDW: 14.1 % (ref 11.5–15.5)
WBC: 7.3 10*3/uL (ref 4.0–10.5)
nRBC: 0 % (ref 0.0–0.2)

## 2023-08-15 LAB — BASIC METABOLIC PANEL WITH GFR
Anion gap: 7 (ref 5–15)
BUN: 8 mg/dL (ref 6–20)
CO2: 25 mmol/L (ref 22–32)
Calcium: 9.5 mg/dL (ref 8.9–10.3)
Chloride: 105 mmol/L (ref 98–111)
Creatinine, Ser: 0.77 mg/dL (ref 0.44–1.00)
GFR, Estimated: >60 mL/min (ref >60)
Glucose, Bld: 135 mg/dL — ABNORMAL HIGH (ref 70–99)
Potassium: 3.3 mmol/L — ABNORMAL LOW (ref 3.5–5.1)
Sodium: 137 mmol/L (ref 135–145)

## 2023-08-15 LAB — RESP PANEL BY RT-PCR (RSV, FLU A&B, COVID)  RVPGX2
Influenza A by PCR: NEGATIVE
Influenza B by PCR: NEGATIVE
Resp Syncytial Virus by PCR: NEGATIVE
SARS Coronavirus 2 by RT PCR: NEGATIVE

## 2023-08-15 MED ORDER — DOXYCYCLINE HYCLATE 100 MG PO CAPS: 100.0000 mg | ORAL_CAPSULE | Freq: Two times a day (BID) | ORAL | 0 refills | Status: DC

## 2023-08-15 MED ORDER — GUAIFENESIN 100 MG/5ML PO LIQD: 100.0000 mg | ORAL | 0 refills | Status: AC | PRN

## 2023-08-15 MED ORDER — ALBUTEROL SULFATE (2.5 MG/3ML) 0.083% IN NEBU
5.0000 mg | INHALATION_SOLUTION | Freq: Once | RESPIRATORY_TRACT | Status: AC
  Administered 2023-08-15: 5 mg via RESPIRATORY_TRACT
  Filled 2023-08-15: qty 6

## 2023-08-15 NOTE — ED Triage Notes (Signed)
 Pt arrives via POV from home with SOB since Wednesday. Being treated by her primary for upper respiratory infection, with prednisone , zpack, and tesslon pearls. Pt also using albuterol  inhaler with little improvement. Diminished lung sounds, congested cough. VSS.

## 2023-08-15 NOTE — ED Provider Notes (Signed)
 [place] EMERGENCY DEPARTMENT AT [facility] Provider Note   CSN: [id number] Arrival date & time: 08/15/23  1027     History  Chief Complaint  Patient presents with   Shortness of Breath    Nicole Cisneros is a 53 y.o. female.  HPI 53 year old female history of COPD, presents today complaining of shortness of breath since Wednesday.  States that she was seen by her primary care doctor with some cough and congestion.  She was diagnosed with an upper respiratory infection and started on prednisone , Z-Pak, and Tessalon  Perles.  She has used albuterol  inhaler but feels like she has something stuck in the middle of her throat.  She has had some subjective fever, no chills, she has been eating and drinking without difficulty.  She is a smoker and does continue to smoke.     Home Medications Prior to Admission medications   Medication Sig Start Date End Date Taking? Authorizing Provider  doxycycline  (VIBRAMYCIN ) 100 MG capsule Take 1 capsule (100 mg total) by mouth 2 (two) times daily. 08/15/23  Yes Auston Blush, MD  guaiFENesin  (ROBITUSSIN) 100 MG/5ML liquid Take 5-10 mLs (100-200 mg total) by mouth every 4 (four) hours as needed for cough or to loosen phlegm. 08/15/23  Yes Auston Blush, MD  atorvastatin  (LIPITOR) 20 MG tablet TAKE 1 TABLET (20 MG TOTAL) BY MOUTH DAILY. 11/09/20 01/30/22  Senaida Dama, NP  celecoxib  (CELEBREX ) 100 MG capsule Take 1 capsule (100 mg total) by mouth 2 (two) times daily. 11/25/22   Brooks, Dana, DO  methylPREDNISolone  (MEDROL  DOSEPAK) 4 MG TBPK tablet Take per packet instruction. Taper dosing. 07/28/23   Brooks, Dana, DO  oxyCODONE  (ROXICODONE ) 5 MG immediate release tablet Take 1 tablet (5 mg total) by mouth every 6 (six) hours as needed for severe pain. 01/01/23   Agarwala, Macario Savin, MD  fluticasone  (FLONASE ) 50 MCG/ACT nasal spray Place 2 sprays into both nostrils daily. FOR NASAL CONGESTION 06/30/17 03/21/19  Gibbons, Claudia J, PA-C  potassium chloride   SA (K-DUR,KLOR-CON ) 20 MEQ tablet Take 2 tablets (40 mEq total) by mouth 2 (two) times daily for 7 days. 06/30/17 08/23/19  Theodora Fish, PA-C      Allergies    Amoxicillin, Aspirin, Erythromycin, Percocet [oxycodone -acetaminophen ], and Vicodin [hydrocodone-acetaminophen ]    Review of Systems   Review of Systems  Physical Exam Updated Vital Signs BP 131/82   Pulse (!) 55   Temp 97.8 F (36.6 C) (Oral)   Resp 15   Ht 1.6 m (5\' 3" )   Wt 69.4 kg   LMP 03/07/2019   SpO2 98%   BMI 27.10 kg/m  Physical Exam Vitals reviewed.  HENT:     Head: Normocephalic.     Mouth/Throat:     Mouth: Mucous membranes are moist.  Eyes:     Pupils: Pupils are equal, round, and reactive to light.  Cardiovascular:     Rate and Rhythm: Normal rate and regular rhythm.  Pulmonary:     Effort: Pulmonary effort is normal.     Breath sounds: No decreased breath sounds, wheezing or rales.     Comments: Few scattered rhonchi noted Abdominal:     Palpations: Abdomen is soft.  Musculoskeletal:        General: Normal range of motion.     Cervical back: Normal range of motion.  Skin:    General: Skin is warm.     Capillary Refill: Capillary refill takes less than 2 seconds.  Neurological:  General: No focal deficit present.  Psychiatric:        Mood and Affect: Mood normal.     ED Results / Procedures / Treatments   Labs (all labs ordered are listed, but only abnormal results are displayed) Labs Reviewed  BASIC METABOLIC PANEL WITH GFR - Abnormal; Notable for the following components:      Result Value   Potassium 3.3 (*)    Glucose, Bld 135 (*)    All other components within normal limits  RESP PANEL BY RT-PCR (RSV, FLU A&B, COVID)  RVPGX2  CBC    EKG EKG Interpretation Date/Time:  Saturday Aug 15 2023 07:43:40 EDT Ventricular Rate:  65 PR Interval:  134 QRS Duration:  88 QT Interval:  392 QTC Calculation: 407 R Axis:   41  Text Interpretation: Normal sinus rhythm  Nonspecific ST abnormality Abnormal ECG When compared with ECG of 02-Sep-2021 07:00, PREVIOUS ECG IS PRESENT Confirmed by Auston Blush [phone]) on 08/15/2023 2:20:32 PM  Radiology DG Chest 2 View Result Date: 08/15/2023 CLINICAL DATA:  SOB EXAM: CHEST - 2 VIEW COMPARISON:  09/02/2021 FINDINGS: Lungs are clear. Heart size and mediastinal contours are within normal limits. No effusion. Visualized bones unremarkable. IMPRESSION: No acute cardiopulmonary disease. Electronically Signed   By: Nicoletta Barrier M.D.   On: 08/15/2023 08:31    Procedures Procedures    Medications Ordered in ED Medications  albuterol  (PROVENTIL ) (2.5 MG/3ML) 0.083% nebulizer solution 5 mg (5 mg Nebulization Given 08/15/23 1040)    ED Course/ Medical Decision Making/ A&P                                 Medical Decision Making Amount and/or Complexity of Data Reviewed Labs: ordered. Radiology: ordered.  Risk Prescription drug management.   53 year old female history of COPD presents today with ongoing cough and congestion.  She has infectious symptoms since earlier this week.  She has been on prednisone  and using albuterol  as well as on Zithromax.  Here on exam she is moving air well with normal oxygenation.  She is given albuterol  but states that she feels like she still has something stuck in her throat. Differential diagnosis includes but is not limited to acute pulmonary conditions such as pneumonia, interstitial edema, bronchitis, exacerbation of COPD, cardiac etiologies. Patient was evaluated with labs, chest x-Teige Rountree, EKG.  No evidence of acute ischemia noted on EKG Chest x-Camella Seim without evidence of acute infiltrate Mild hypokalemia Normal CBC normal and negative COVID flu and RSV Patient remains stable here in the ED with normal oxygen saturations, normal respiratory rate, normal blood pressure, normal heart rate and afebrile Will change to doxycycline  Will add guaifenesin         Final Clinical Impression(s)  / ED Diagnoses Final diagnoses:  Bronchitis    Rx / DC Orders ED Discharge Orders          Ordered    doxycycline  (VIBRAMYCIN ) 100 MG capsule  2 times daily        08/15/23 1430    guaiFENesin  (ROBITUSSIN) 100 MG/5ML liquid  Every 4 hours PRN        08/15/23 1430              Auston Blush, MD 08/15/23 1431

## 2023-08-15 NOTE — Discharge Instructions (Signed)
 Please continue your albuterol  2 puffs every 3-4 hours Please continue your other medications.  Stop the Zithromax and start doxycycline .  Began the Robitussin and drink plenty of fluids. Return if you are having worsening symptoms

## 2023-08-17 ENCOUNTER — Ambulatory Visit: Admitting: Sports Medicine

## 2023-08-17 DIAGNOSIS — Z9889 Other specified postprocedural states: Secondary | ICD-10-CM | POA: Diagnosis not present

## 2023-08-17 DIAGNOSIS — M79642 Pain in left hand: Secondary | ICD-10-CM

## 2023-08-17 DIAGNOSIS — R7303 Prediabetes: Secondary | ICD-10-CM | POA: Diagnosis not present

## 2023-08-17 DIAGNOSIS — G629 Polyneuropathy, unspecified: Secondary | ICD-10-CM | POA: Diagnosis not present

## 2023-08-17 DIAGNOSIS — M79641 Pain in right hand: Secondary | ICD-10-CM

## 2023-08-17 NOTE — Progress Notes (Unsigned)
 Patient says that her symptoms seem to be moving up her right arm. She also says that she is using the left hand more as she is not able to use the right, so she is concerned that the left hand may become as bad as the right.

## 2023-08-17 NOTE — Progress Notes (Unsigned)
 Nicole Cisneros - 53 y.o. female MRN [id number]  Date of birth: 06-20-1970  Office Visit Note: Visit Date: 08/17/2023 PCP: Charle Congo, MD Referred by: Charle Congo, MD  Subjective: Chief Complaint  Patient presents with  . Left Hand - Follow-up   HPI: Nicole Cisneros is a pleasant 53 y.o. female who presents today for follow-up of bilateral hand pain/arm pain.    Pertinent ROS were reviewed with the patient and found to be negative unless otherwise specified above in HPI.   Assessment & Plan: Visit Diagnoses: No diagnosis found.  Plan: ***  Follow-up: No follow-ups on file.   Meds & Orders: No orders of the defined types were placed in this encounter.  No orders of the defined types were placed in this encounter.    Procedures: No procedures performed      Clinical History: 07/28/2023 Left Xray 3 views of the left hand including AP, oblique and lateral film were  ordered and reviewed by myself.  X-rays demonstrates mild arthritic change  throughout the hand and digits.  No acute fracture or otherwise acute bony  abnormality noted.  -----  12/22/2022 Right Hand Xray X-rays of the right hand, multiple views were obtained today  X-rays demonstrate appropriate appearance of the radiocarpal and midcarpal  intervals without significant bony abnormalities.  Bone mineralization is  appropriate.  She reports that she has been smoking cigarettes. She has a 6 pack-year smoking history. She has never used smokeless tobacco. No results for input(s): "HGBA1C", "LABURIC" in the last 8760 hours.  Objective:   Vital Signs: LMP 03/07/2019   Physical Exam  Gen: Well-appearing, in no acute distress; non-toxic CV: Well-perfused. Warm.  Resp: Breathing unlabored on room air; no wheezing. Psych: Fluid speech in conversation; appropriate affect; normal thought process  Ortho Exam - ***  Imaging: No results found.  Past Medical/Family/Surgical/Social History: Medications &  Allergies reviewed per EMR, new medications updated. Patient Active Problem List   Diagnosis Date Noted  . Hyperlipidemia 06/07/2020  . Myalgias, multiple 10/23/2014  . Pain in joint, shoulder region 09/14/2014  . Left knee pain 06/21/2014  . GERD (gastroesophageal reflux disease) 03/14/2013  . Anemia 03/14/2013  . Smoking 03/14/2013  . Appendicitis 03/14/2011   Past Medical History:  Diagnosis Date  . Anemia   . Constipation   . COPD (chronic obstructive pulmonary disease) (HCC)   . Cough   . GERD (gastroesophageal reflux disease)   . Headache(784.0)   . Migraine   . Sore throat   . Weakness   . Weight loss    Family History  Problem Relation Age of Onset  . Heart disease Mother   . Hypertension Mother   . Rheum arthritis Mother   . Gout Mother   . Cancer Father        bone  . Rheum arthritis Father   . Breast cancer Sister   . Cancer Sister        breast  . Heart disease Sister   . Arthritis Sister   . Epilepsy Brother   . Heart Problems Brother   . Arthritis Brother   . Arthritis Brother   . Arthritis Brother   . Asthma Son   . Asthma Daughter    Past Surgical History:  Procedure Laterality Date  . 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  . LAPAROSCOPIC APPENDECTOMY  03/14/2011   Procedure: APPENDECTOMY LAPAROSCOPIC;  Surgeon: Thayne Fine, MD;  Location: [facility] OR;  Service: General;  Laterality:  N/A;  . TUBAL LIGATION     Social History   Occupational History  . Occupation: housekeeping  Tobacco Use  . Smoking status: Every Day    Current packs/day: 0.50    Average packs/day: 0.5 packs/day for 12.0 years (6.0 ttl pk-yrs)    Types: Cigarettes  . Smokeless tobacco: Never  . Tobacco comments:    Smoking 1 ppd  Vaping Use  . Vaping status: Never Used  Substance and Sexual Activity  . Alcohol use: No    Alcohol/week: 0.0 standard drinks of alcohol  . Drug use: No  . Sexual activity: Not Currently    Birth control/protection: Post-menopausal

## 2023-11-30 ENCOUNTER — Telehealth: Payer: Self-pay | Admitting: Sports Medicine

## 2023-11-30 NOTE — Telephone Encounter (Signed)
 Patient called. She would like a referral sent to [facility] for her hand.

## 2023-12-13 ENCOUNTER — Ambulatory Visit (HOSPITAL_COMMUNITY)
Admission: EM | Admit: 2023-12-13 | Discharge: 2023-12-13 | Disposition: A | Discharge status: Home / Self Care | Attending: Physician Assistant | Admitting: Physician Assistant

## 2023-12-13 ENCOUNTER — Encounter (HOSPITAL_COMMUNITY): Payer: Self-pay | Admitting: Emergency Medicine

## 2023-12-13 DIAGNOSIS — R0989 Other specified symptoms and signs involving the circulatory and respiratory systems: Secondary | ICD-10-CM

## 2023-12-13 DIAGNOSIS — J441 Chronic obstructive pulmonary disease with (acute) exacerbation: Secondary | ICD-10-CM | POA: Diagnosis not present

## 2023-12-13 LAB — POC COVID19/FLU A&B COMBO
Covid Antigen, POC: NEGATIVE
Influenza A Antigen, POC: NEGATIVE
Influenza B Antigen, POC: NEGATIVE

## 2023-12-13 LAB — POCT RAPID STREP A (OFFICE): Rapid Strep A Screen: NEGATIVE

## 2023-12-13 MED ORDER — IPRATROPIUM-ALBUTEROL 0.5-2.5 (3) MG/3ML IN SOLN
3.0000 mL | Freq: Four times a day (QID) | RESPIRATORY_TRACT | 0 refills | Status: AC | PRN
Start: 2023-12-13 — End: ?

## 2023-12-13 MED ORDER — ONDANSETRON 4 MG PO TBDP
4.0000 mg | ORAL_TABLET | Freq: Once | ORAL | Status: AC
  Administered 2023-12-13: 4 mg via ORAL

## 2023-12-13 MED ORDER — ONDANSETRON 4 MG PO TBDP: 4.0000 mg | ORAL_TABLET | Freq: Three times a day (TID) | ORAL | 0 refills | Status: AC | PRN

## 2023-12-13 MED ORDER — ACETAMINOPHEN 325 MG PO TABS
ORAL_TABLET | ORAL | Status: AC
Start: 2023-12-13 — End: 2023-12-13
  Filled 2023-12-13: qty 2

## 2023-12-13 MED ORDER — DOXYCYCLINE HYCLATE 100 MG PO TABS: 100.0000 mg | ORAL_TABLET | Freq: Two times a day (BID) | ORAL | 0 refills | Status: AC

## 2023-12-13 MED ORDER — ALBUTEROL SULFATE HFA 108 (90 BASE) MCG/ACT IN AERS: 1.0000 | INHALATION_SPRAY | Freq: Four times a day (QID) | RESPIRATORY_TRACT | 0 refills | Status: AC | PRN

## 2023-12-13 MED ORDER — IPRATROPIUM-ALBUTEROL 0.5-2.5 (3) MG/3ML IN SOLN
3.0000 mL | Freq: Once | RESPIRATORY_TRACT | Status: AC
  Administered 2023-12-13: 3 mL via RESPIRATORY_TRACT

## 2023-12-13 MED ORDER — IPRATROPIUM-ALBUTEROL 0.5-2.5 (3) MG/3ML IN SOLN
RESPIRATORY_TRACT | Status: AC
  Filled 2023-12-13: qty 3

## 2023-12-13 MED ORDER — ACETAMINOPHEN 325 MG PO TABS
650.0000 mg | ORAL_TABLET | Freq: Once | ORAL | Status: AC
  Administered 2023-12-13: 650 mg via ORAL

## 2023-12-13 MED ORDER — ONDANSETRON 4 MG PO TBDP
ORAL_TABLET | ORAL | Status: AC
  Filled 2023-12-13: qty 1

## 2023-12-13 NOTE — ED Triage Notes (Signed)
 Pt reports cough started yesterday. Today woke up with vomiting, sore throat, fatigue, and chest pain. Took some cough syrup

## 2023-12-13 NOTE — ED Provider Notes (Signed)
 [facility]    CSN: [id number] Arrival date & time: 12/13/23  1300      History   Chief Complaint Chief Complaint  Patient presents with   Cough   Fatigue   Emesis    HPI Nicole Cisneros is a 53 y.o. female.  has a past medical history of Anemia, Constipation, COPD (chronic obstructive pulmonary disease) (HCC), Cough, GERD (gastroesophageal reflux disease), Headache(784.0), Migraine, Sore throat, Weakness, and Weight loss.   HPI   Pt presents today with concerns for chest congestion, sore throat, coughing, body aches, nausea and vomiting She reports SOB and wheezing along with fever and chills She is not sure of tmax   Interventions: She tried using her albuterol  last night but this did not help with her SOB. She has tried using cough drops and tessalon  pearls without much relief She reports taking Tylenol  last night but this did not provide much relief. She states she cannot take NSAIDs due to hx of stomach ulcers.   She states she has a previous hx of breathing conditions- states she thinks its bronchitis. Chart review shows PMHx of COPD   Past Medical History:  Diagnosis Date   Anemia    Constipation    COPD (chronic obstructive pulmonary disease) (HCC)    Cough    GERD (gastroesophageal reflux disease)    Headache(784.0)    Migraine    Sore throat    Weakness    Weight loss     Patient Active Problem List   Diagnosis Date Noted   Hyperlipidemia 06/07/2020   Myalgias, multiple 10/23/2014   Pain in joint, shoulder region 09/14/2014   Left knee pain 06/21/2014   GERD (gastroesophageal reflux disease) 03/14/2013   Anemia 03/14/2013   Smoking 03/14/2013   Appendicitis 03/14/2011    Past Surgical History:  Procedure Laterality Date   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   LAPAROSCOPIC APPENDECTOMY  03/14/2011   Procedure: APPENDECTOMY LAPAROSCOPIC;  Surgeon: Alm VEAR Angle, MD;  Location: [facility] OR;  Service: General;  Laterality: N/A;   TUBAL  LIGATION      OB History     Gravida  5   Para  2   Term  2   Preterm      AB  3   Living  2      SAB  3   IAB      Ectopic      Multiple      Live Births  2            Home Medications    Prior to Admission medications   Medication Sig Start Date End Date Taking? Authorizing Provider  albuterol  (VENTOLIN  HFA) 108 (90 Base) MCG/ACT inhaler Inhale 1-2 puffs into the lungs every 6 (six) hours as needed for wheezing or shortness of breath. 12/13/23  Yes Cherryl Babin E, PA-C  doxycycline  (VIBRA -TABS) 100 MG tablet Take 1 tablet (100 mg total) by mouth 2 (two) times daily for 7 days. 12/13/23 12/20/23 Yes Zae Kirtz E, PA-C  ipratropium-albuterol  (DUONEB) 0.5-2.5 (3) MG/3ML SOLN Take 3 mLs by nebulization every 6 (six) hours as needed. 12/13/23  Yes Jaid Quirion E, PA-C  ondansetron  (ZOFRAN -ODT) 4 MG disintegrating tablet Take 1 tablet (4 mg total) by mouth every 8 (eight) hours as needed for nausea or vomiting. 12/13/23  Yes Cuinn Westerhold E, PA-C  atorvastatin  (LIPITOR) 20 MG tablet TAKE 1 TABLET (20 MG TOTAL) BY MOUTH DAILY. 11/09/20 01/30/22  Lorren, Amy  J, NP  celecoxib  (CELEBREX ) 100 MG capsule Take 1 capsule (100 mg total) by mouth 2 (two) times daily. 11/25/22   Burnetta Brunet, DO  guaiFENesin  (ROBITUSSIN) 100 MG/5ML liquid Take 5-10 mLs (100-200 mg total) by mouth every 4 (four) hours as needed for cough or to loosen phlegm. 08/15/23   Levander Houston, MD  methylPREDNISolone  (MEDROL  DOSEPAK) 4 MG TBPK tablet Take per packet instruction. Taper dosing. 07/28/23   Brooks, Dana, DO  oxyCODONE  (ROXICODONE ) 5 MG immediate release tablet Take 1 tablet (5 mg total) by mouth every 6 (six) hours as needed for severe pain. 01/01/23   Agarwala, Gildardo, MD  fluticasone  (FLONASE ) 50 MCG/ACT nasal spray Place 2 sprays into both nostrils daily. FOR NASAL CONGESTION 06/30/17 03/21/19  Gibbons, Claudia J, PA-C  potassium chloride  SA (K-DUR,KLOR-CON ) 20 MEQ tablet Take 2 tablets (40 mEq total) by mouth  2 (two) times daily for 7 days. 06/30/17 08/23/19  Norris Will PARAS, PA-C    Family History Family History  Problem Relation Age of Onset   Heart disease Mother    Hypertension Mother    Rheum arthritis Mother    Gout Mother    Cancer Father        bone   Rheum arthritis Father    Breast cancer Sister    Cancer Sister        breast   Heart disease Sister    Arthritis Sister    Epilepsy Brother    Heart Problems Brother    Arthritis Brother    Arthritis Brother    Arthritis Brother    Asthma Son    Asthma Daughter     Social History Social History   Tobacco Use   Smoking status: Every Day    Current packs/day: 0.50    Average packs/day: 0.5 packs/day for 12.0 years (6.0 ttl pk-yrs)    Types: Cigarettes   Smokeless tobacco: Never   Tobacco comments:    Smoking 1 ppd  Vaping Use   Vaping status: Never Used  Substance Use Topics   Alcohol use: No    Alcohol/week: 0.0 standard drinks of alcohol   Drug use: No     Allergies   Amoxicillin, Aspirin, Erythromycin, Percocet [oxycodone -acetaminophen ], and Vicodin [hydrocodone-acetaminophen ]   Review of Systems Review of Systems  Constitutional:  Positive for chills, fatigue and fever.  HENT:  Positive for congestion and sore throat. Negative for ear pain and rhinorrhea.   Respiratory:  Positive for cough, shortness of breath and wheezing.   Gastrointestinal:  Positive for nausea and vomiting. Negative for diarrhea.  Musculoskeletal:  Positive for myalgias.  Skin:  Negative for rash.     Physical Exam Triage Vital Signs ED Triage Vitals [12/13/23 1348]  Encounter Vitals Group     BP 130/85     Girls Systolic BP Percentile      Girls Diastolic BP Percentile      Boys Systolic BP Percentile      Boys Diastolic BP Percentile      Pulse Rate 73     Resp 19     Temp 97.8 F (36.6 C)     Temp Source Oral     SpO2 96 %     Weight      Height      Head Circumference      Peak Flow      Pain Score       Pain Loc      Pain Education  Exclude from Growth Chart    No data found.  Updated Vital Signs BP 130/85 (BP Location: Right Arm)   Pulse 73   Temp 97.8 F (36.6 C) (Oral)   Resp 19   LMP 03/07/2019   SpO2 96%   Visual Acuity Right Eye Distance:   Left Eye Distance:   Bilateral Distance:    Right Eye Near:   Left Eye Near:    Bilateral Near:     Physical Exam Vitals reviewed.  Constitutional:      General: She is awake.     Appearance: Normal appearance. She is well-developed and well-groomed.  HENT:     Head: Normocephalic and atraumatic.     Right Ear: Hearing, tympanic membrane and ear canal normal.     Left Ear: Hearing, tympanic membrane and ear canal normal.     Mouth/Throat:     Lips: Pink.     Mouth: Mucous membranes are moist.     Pharynx: Oropharynx is clear. Uvula midline. No pharyngeal swelling, oropharyngeal exudate, posterior oropharyngeal erythema, uvula swelling or postnasal drip.  Cardiovascular:     Rate and Rhythm: Normal rate and regular rhythm.     Pulses: Normal pulses.          Radial pulses are 2+ on the right side and 2+ on the left side.     Heart sounds: Normal heart sounds. No murmur heard.    No friction rub. No gallop.  Pulmonary:     Effort: Pulmonary effort is normal.     Breath sounds: Decreased air movement present. Decreased breath sounds present. No wheezing, rhonchi or rales.  Musculoskeletal:     Cervical back: Normal range of motion and neck supple.  Lymphadenopathy:     Head:     Right side of head: No submental, submandibular or preauricular adenopathy.     Left side of head: No submental, submandibular or preauricular adenopathy.     Cervical:     Right cervical: No superficial cervical adenopathy.    Left cervical: No superficial cervical adenopathy.     Upper Body:     Right upper body: No supraclavicular adenopathy.     Left upper body: No supraclavicular adenopathy.  Neurological:     Mental Status: She is  alert.  Psychiatric:        Behavior: Behavior is cooperative.      UC Treatments / Results  Labs (all labs ordered are listed, but only abnormal results are displayed) Labs Reviewed  POC COVID19/FLU A&B COMBO  POCT RAPID STREP A (OFFICE)    EKG   Radiology No results found.  Procedures Procedures (including critical care time)  Medications Ordered in UC Medications  acetaminophen  (TYLENOL ) tablet 650 mg (650 mg Oral Given 12/13/23 1513)  ondansetron  (ZOFRAN -ODT) disintegrating tablet 4 mg (4 mg Oral Given 12/13/23 1513)  ipratropium-albuterol  (DUONEB) 0.5-2.5 (3) MG/3ML nebulizer solution 3 mL (3 mLs Nebulization Given 12/13/23 1513)    Initial Impression / Assessment and Plan / UC Course  I have reviewed the triage vital signs and the nursing notes.  Pertinent labs & imaging results that were available during my care of the patient were reviewed by me and considered in my medical decision making (see chart for details).    Patient reports that her breathing feels a bit more comfortable and her chest feels less tight following DuoNeb administration.  Air movement is notably improved but she still has some lingering decreased lung sounds in the bases  Final  Clinical Impressions(s) / UC Diagnoses   Final diagnoses:  Symptoms of upper respiratory infection (URI)  COPD exacerbation (HCC)    Patient presents today with concerns for chest tightness, shortness of breath, coughing, nasal congestion and sore throat that is been ongoing since yesterday.  She reports that she took some cough drops, benzonatate  and Tylenol  as well as tried using her albuterol  rescue inhaler but this did not provide much relief.  Of note patient does have a history of COPD and based on her symptoms as well as physical exam I am concerned for potential exacerbation.  Patient has decreased air movement as well as decreased lung sounds in bilateral lung bases.  She states that her symptoms subjectively  improved following DuoNeb administration in clinic.  Will send patient home with nebulizer as well as DuoNeb solution prescription.  Given concern for COPD exacerbation will send doxycycline  p.o. twice daily x 7 days along with refill of albuterol  rescue inhaler.  Patient also expresses concerns for nausea and vomiting so we will send Zofran  ODT prescription and as well.  ED and return precautions reviewed and provided in AVS.  Follow-up as needed for progressing or persistent symptoms    Discharge Instructions      Based on your described symptoms and the duration of symptoms it is likely that you have a viral upper respiratory infection (often called a cold)  Symptoms can last for 3-10 days with lingering cough and intermittent symptoms potentially  lasting several  weeks after that.  The goal of treatment at this time is to reduce your symptoms and discomfort   You can use the following medications and measures to help yourself feel better until your body fights this off: DayQuil/NyQuil, TheraFlu, Alka-Seltzer  (these medications typically have the same active ingredients in them so you can choose whichever one you prefer and take consistently during the day and night according to the manufactures instructions.) Flonase  A daily antihistamine such as Zyrtec, Claritin, Allegra per your preference.  Please choose 1 and take consistently. Increased fluids.  It is recommended that you take in at least 64 ounces of water per day when you are not sick so it is important to increase this when you are sick and your body may be running fever. Rest Cough drops Chloraseptic throat spray to help with sore throat Nasal saline spray or nasal flushes to help with congestion and runny nose  We also sent you home with a nebulizer machine to help with your breathing.  I have sent in DuoNeb solution for you to use up to every 6 hours as needed for shortness of breath and persistent coughing.  I have also  sent in a medication called Zofran  to assist with your nausea and vomiting.  You can take this as needed up to every 8 hours.  Please be advised that the most common side effect of this medication is constipation.  If you start to develop the symptoms I recommend taking a few doses of a stool softener and increasing your fiber.  If needed you can also take a few doses of MiraLAX until you have regular bowel movements again.   If your symptoms seem like they are getting worse over the next 5 to 7 days or not improving you can always follow-up here in urgent care or go to your primary care provider for further management. Go to the ER if you begin to have more serious symptoms such as shortness of breath, trouble breathing, loss of  consciousness, swelling around the eyes, high fever, severe lasting headaches, vision changes or neck pain/stiffness.       ED Prescriptions     Medication Sig Dispense Auth. Provider   ondansetron  (ZOFRAN -ODT) 4 MG disintegrating tablet Take 1 tablet (4 mg total) by mouth every 8 (eight) hours as needed for nausea or vomiting. 20 tablet Conda Wannamaker E, PA-C   ipratropium-albuterol  (DUONEB) 0.5-2.5 (3) MG/3ML SOLN Take 3 mLs by nebulization every 6 (six) hours as needed. 360 mL Anabela Crayton E, PA-C   albuterol  (VENTOLIN  HFA) 108 (90 Base) MCG/ACT inhaler Inhale 1-2 puffs into the lungs every 6 (six) hours as needed for wheezing or shortness of breath. 8 g Franck Vinal E, PA-C   doxycycline  (VIBRA -TABS) 100 MG tablet Take 1 tablet (100 mg total) by mouth 2 (two) times daily for 7 days. 14 tablet Caily Rakers E, PA-C      PDMP not reviewed this encounter.   Marylene Rocky BRAVO, PA-C 12/13/23 1552

## 2023-12-13 NOTE — Discharge Instructions (Addendum)
 Based on your described symptoms and the duration of symptoms it is likely that you have a viral upper respiratory infection (often called a cold)  Symptoms can last for 3-10 days with lingering cough and intermittent symptoms potentially  lasting several  weeks after that.  The goal of treatment at this time is to reduce your symptoms and discomfort   You can use the following medications and measures to help yourself feel better until your body fights this off: DayQuil/NyQuil, TheraFlu, Alka-Seltzer  (these medications typically have the same active ingredients in them so you can choose whichever one you prefer and take consistently during the day and night according to the manufactures instructions.) Flonase  A daily antihistamine such as Zyrtec, Claritin, Allegra per your preference.  Please choose 1 and take consistently. Increased fluids.  It is recommended that you take in at least 64 ounces of water per day when you are not sick so it is important to increase this when you are sick and your body may be running fever. Rest Cough drops Chloraseptic throat spray to help with sore throat Nasal saline spray or nasal flushes to help with congestion and runny nose  We also sent you home with a nebulizer machine to help with your breathing.  I have sent in DuoNeb solution for you to use up to every 6 hours as needed for shortness of breath and persistent coughing.  I have also sent in a medication called Zofran  to assist with your nausea and vomiting.  You can take this as needed up to every 8 hours.  Please be advised that the most common side effect of this medication is constipation.  If you start to develop the symptoms I recommend taking a few doses of a stool softener and increasing your fiber.  If needed you can also take a few doses of MiraLAX until you have regular bowel movements again.  I am sending you in an antibiotic called doxycycline  for you to take by mouth twice per day for 7  days.  Please be advised that this medication can cause GI upset so take it with food.  It can also increase your susceptibility to sunlight causing sunburn so please make sure that you are using sunscreen as well as covering your skin with protective clothing if you are going to be outdoors for prolonged periods of time.  If your symptoms seem like they are getting worse over the next 5 to 7 days or not improving you can always follow-up here in urgent care or go to your primary care provider for further management. Go to the ER if you begin to have more serious symptoms such as shortness of breath, trouble breathing, loss of consciousness, swelling around the eyes, high fever, severe lasting headaches, vision changes or neck pain/stiffness.

## 2024-02-15 ENCOUNTER — Encounter: Payer: Self-pay | Admitting: Radiology

## 2024-04-04 ENCOUNTER — Other Ambulatory Visit: Payer: Self-pay | Admitting: Internal Medicine

## 2024-04-04 DIAGNOSIS — Z1231 Encounter for screening mammogram for malignant neoplasm of breast: Secondary | ICD-10-CM

## 2024-04-20 ENCOUNTER — Ambulatory Visit
Admission: RE | Admit: 2024-04-20 | Discharge: 2024-04-20 | Disposition: A | Discharge status: Home / Self Care | Source: Ambulatory Visit | Attending: Internal Medicine | Admitting: Internal Medicine

## 2024-04-20 DIAGNOSIS — Z1231 Encounter for screening mammogram for malignant neoplasm of breast: Secondary | ICD-10-CM
